# Patient Record
Sex: Female | Born: 1940 | Race: White | Hispanic: No | Marital: Married | Smoking: Never smoker
Health system: Southern US, Community
[De-identification: ages and names within clinical notes are randomized; demographics above are authoritative.]

## PROBLEM LIST (undated history)

## (undated) DIAGNOSIS — T7840XA Allergy, unspecified, initial encounter: Secondary | ICD-10-CM

## (undated) DIAGNOSIS — H269 Unspecified cataract: Secondary | ICD-10-CM

## (undated) DIAGNOSIS — K589 Irritable bowel syndrome without diarrhea: Secondary | ICD-10-CM

## (undated) DIAGNOSIS — M1990 Unspecified osteoarthritis, unspecified site: Secondary | ICD-10-CM

## (undated) DIAGNOSIS — Z8719 Personal history of other diseases of the digestive system: Secondary | ICD-10-CM

## (undated) DIAGNOSIS — Z9889 Other specified postprocedural states: Secondary | ICD-10-CM

## (undated) DIAGNOSIS — K219 Gastro-esophageal reflux disease without esophagitis: Secondary | ICD-10-CM

## (undated) DIAGNOSIS — T8859XA Other complications of anesthesia, initial encounter: Secondary | ICD-10-CM

## (undated) DIAGNOSIS — R112 Nausea with vomiting, unspecified: Secondary | ICD-10-CM

## (undated) DIAGNOSIS — M069 Rheumatoid arthritis, unspecified: Secondary | ICD-10-CM

## (undated) HISTORY — DX: Unspecified cataract: H26.9

## (undated) HISTORY — PX: JOINT REPLACEMENT: SHX530

## (undated) HISTORY — PX: FRACTURE SURGERY: SHX138

## (undated) HISTORY — PX: EYE SURGERY: SHX253

## (undated) HISTORY — PX: ABDOMINAL HYSTERECTOMY: SHX81

## (undated) HISTORY — PX: HAND SURGERY: SHX662

## (undated) HISTORY — DX: Rheumatoid arthritis, unspecified: M06.9

## (undated) HISTORY — DX: Allergy, unspecified, initial encounter: T78.40XA

## (undated) HISTORY — PX: FOOT SURGERY: SHX648

## (undated) HISTORY — PX: COSMETIC SURGERY: SHX468

## (undated) HISTORY — PX: ORIF SHOULDER FRACTURE: SHX5035

## (undated) HISTORY — PX: TUBAL LIGATION: SHX77

## (undated) HISTORY — PX: TONSILLECTOMY: SUR1361

---

## 2010-01-10 ENCOUNTER — Ambulatory Visit: Payer: Self-pay | Admitting: Cardiology

## 2011-02-13 ENCOUNTER — Other Ambulatory Visit: Payer: Self-pay | Admitting: Orthopedic Surgery

## 2011-02-13 ENCOUNTER — Ambulatory Visit
Admission: RE | Admit: 2011-02-13 | Discharge: 2011-02-13 | Disposition: A | Discharge status: Home / Self Care | Payer: Medicare Other | Source: Ambulatory Visit | Attending: Orthopedic Surgery | Admitting: Orthopedic Surgery

## 2011-02-13 DIAGNOSIS — R52 Pain, unspecified: Secondary | ICD-10-CM

## 2011-02-21 ENCOUNTER — Ambulatory Visit (HOSPITAL_COMMUNITY)
Admission: RE | Admit: 2011-02-21 | Discharge: 2011-02-21 | Disposition: A | Discharge status: Home / Self Care | Payer: Medicare Other | Source: Ambulatory Visit | Attending: Orthopedic Surgery | Admitting: Orthopedic Surgery

## 2011-02-21 ENCOUNTER — Encounter (HOSPITAL_COMMUNITY)
Admission: RE | Admit: 2011-02-21 | Discharge: 2011-02-21 | Disposition: A | Discharge status: Home / Self Care | Payer: Medicare Other | Source: Ambulatory Visit | Attending: Orthopedic Surgery | Admitting: Orthopedic Surgery

## 2011-02-21 ENCOUNTER — Other Ambulatory Visit (HOSPITAL_COMMUNITY): Payer: Self-pay | Admitting: Orthopedic Surgery

## 2011-02-21 DIAGNOSIS — S42309A Unspecified fracture of shaft of humerus, unspecified arm, initial encounter for closed fracture: Secondary | ICD-10-CM

## 2011-02-21 DIAGNOSIS — X58XXXA Exposure to other specified factors, initial encounter: Secondary | ICD-10-CM | POA: Insufficient documentation

## 2011-02-21 DIAGNOSIS — S42213A Unspecified displaced fracture of surgical neck of unspecified humerus, initial encounter for closed fracture: Secondary | ICD-10-CM | POA: Insufficient documentation

## 2011-02-21 DIAGNOSIS — Z01812 Encounter for preprocedural laboratory examination: Secondary | ICD-10-CM | POA: Insufficient documentation

## 2011-02-21 DIAGNOSIS — Z01818 Encounter for other preprocedural examination: Secondary | ICD-10-CM | POA: Insufficient documentation

## 2011-02-21 LAB — COMPREHENSIVE METABOLIC PANEL
AST: 31 U/L (ref 0–37)
Albumin: 4.2 g/dL (ref 3.5–5.2)
Alkaline Phosphatase: 103 U/L (ref 39–117)
BUN: 16 mg/dL (ref 6–23)
Chloride: 103 mEq/L (ref 96–112)
Potassium: 4.5 mEq/L (ref 3.5–5.1)
Total Bilirubin: 0.4 mg/dL (ref 0.3–1.2)

## 2011-02-21 LAB — CBC
Hemoglobin: 11.2 g/dL — ABNORMAL LOW (ref 12.0–15.0)
MCH: 28.9 pg (ref 26.0–34.0)
MCHC: 30.4 g/dL (ref 30.0–36.0)
RDW: 14.7 % (ref 11.5–15.5)

## 2011-02-21 LAB — URINALYSIS, ROUTINE W REFLEX MICROSCOPIC
Glucose, UA: NEGATIVE mg/dL
Hgb urine dipstick: NEGATIVE
Ketones, ur: NEGATIVE mg/dL
Protein, ur: NEGATIVE mg/dL
Urobilinogen, UA: 0.2 mg/dL (ref 0.0–1.0)

## 2011-02-21 LAB — DIFFERENTIAL
Basophils Relative: 0 % (ref 0–1)
Eosinophils Absolute: 0.1 10*3/uL (ref 0.0–0.7)
Eosinophils Relative: 2 % (ref 0–5)
Monocytes Absolute: 0.4 10*3/uL (ref 0.1–1.0)
Monocytes Relative: 6 % (ref 3–12)
Neutro Abs: 4.3 10*3/uL (ref 1.7–7.7)

## 2011-02-21 LAB — PROTIME-INR: Prothrombin Time: 12.6 seconds (ref 11.6–15.2)

## 2011-02-21 LAB — TYPE AND SCREEN: Antibody Screen: NEGATIVE

## 2011-02-22 LAB — URINE CULTURE: Culture  Setup Time: 201210231014

## 2011-02-23 ENCOUNTER — Ambulatory Visit (HOSPITAL_COMMUNITY)
Admission: RE | Admit: 2011-02-23 | Discharge: 2011-02-24 | Disposition: A | Discharge status: Home / Self Care | Payer: Medicare Other | Source: Ambulatory Visit | Attending: Orthopedic Surgery | Admitting: Orthopedic Surgery

## 2011-02-23 ENCOUNTER — Inpatient Hospital Stay (HOSPITAL_COMMUNITY): Payer: Medicare Other

## 2011-02-23 DIAGNOSIS — Z01812 Encounter for preprocedural laboratory examination: Secondary | ICD-10-CM | POA: Insufficient documentation

## 2011-02-23 DIAGNOSIS — S42293A Other displaced fracture of upper end of unspecified humerus, initial encounter for closed fracture: Principal | ICD-10-CM | POA: Insufficient documentation

## 2011-02-23 DIAGNOSIS — Z01818 Encounter for other preprocedural examination: Secondary | ICD-10-CM | POA: Insufficient documentation

## 2011-02-23 DIAGNOSIS — Y929 Unspecified place or not applicable: Secondary | ICD-10-CM | POA: Insufficient documentation

## 2011-02-23 DIAGNOSIS — X58XXXA Exposure to other specified factors, initial encounter: Secondary | ICD-10-CM | POA: Insufficient documentation

## 2011-02-24 LAB — CALCIUM, IONIZED: Calcium, Ion: 1.15 mmol/L (ref 1.12–1.32)

## 2011-02-24 LAB — PHOSPHORUS: Phosphorus: 2.6 mg/dL (ref 2.3–4.6)

## 2011-02-24 LAB — BASIC METABOLIC PANEL
GFR calc Af Amer: >90 mL/min (ref >90)
GFR calc non Af Amer: 87 mL/min — ABNORMAL LOW (ref >90)
Potassium: 4.1 mEq/L (ref 3.5–5.1)
Sodium: 143 mEq/L (ref 135–145)

## 2011-02-24 LAB — CBC
MCHC: 31.4 g/dL (ref 30.0–36.0)
RDW: 14.8 % (ref 11.5–15.5)

## 2011-02-28 LAB — PTH, INTACT AND CALCIUM: PTH: 32.2 pg/mL (ref 14.0–72.0)

## 2011-03-01 LAB — VITAMIN D 1,25 DIHYDROXY
Vitamin D2 1, 25 (OH)2: <8 pg/mL
Vitamin D3 1, 25 (OH)2: 39 pg/mL

## 2011-03-07 NOTE — Op Note (Signed)
[name], [name] NO.:  [id number]  MEDICAL RECORD NO.:  [id number]  LOCATION:  5037                         FACILITY:  [facility]  PHYSICIAN:  Doralee Albino. Carola Frost, M.D. DATE OF BIRTH:  [birth year]  DATE OF PROCEDURE:  02/23/2011 DATE OF DISCHARGE:                              OPERATIVE REPORT   PREOPERATIVE DIAGNOSIS:  Left proximal humerus fracture, three-part.  POSTOPERATIVE DIAGNOSIS:  Left proximal humerus fracture, three-part.  PROCEDURE:  Open reduction and internal fixation of left proximal humerus using a Synthes proximal humeral locking plate.  SURGEON:  Doralee Albino. Carola Frost, MD  ASSISTANT:  Mearl Latin, PA  ANESTHESIA:  General.  COMPLICATIONS:  None.  ESTIMATED BLOOD LOSS:  100 mL.  DISPOSITION:  To PACU.  CONDITION:  Stable.  BRIEF SUMMARY AND INDICATION OF PROCEDURE:  Bailey Petersen is a 70 year old left-hand dominant female who sustained a comminuted varus proximal humerus fracture with displacement of the tuberosities and shaft.  I discussed with her the risks and benefits of nonsurgical versus surgical management.  She understood those complications including AVN, arthritis, symptomatic hardware, need for further surgery, DVT, PE, heart attack, stroke, and multiple others and she did wish to proceed.  BRIEF DESCRIPTION OF PROCEDURE:  Ms. Kassing was taken up room after administration of preoperative antibiotics.  General anesthesia was induced.  Her left upper extremity was prepped and draped in sterile fashion after being placed supine on a radiolucent table.  A standard deltopectoral approach was then made through an 8-cm incision.  The deltopectoral groove was identified.  The cephalic vein retracted laterally.  The deltopectoral fascia exposed and incised.  The coracoid process identified and the retractor placed to expose the bursa.  This was removed with dissecting scissors and a #2 FiberWire was placed into the lesser tuberosity  fragment which remained just medial to the biceps groove.  This was largely impacted.  The greater tuberosity fragment was then secured with two #2 FiberWire sutures using Mason-Allen technique to enable mobilization.  The tuberosity fragment to the greater was distracted to allow for disimpaction of the head and reapproximation of the appropriate neck shaft angle.  Later this had to be revised with placement of multiple K-wires tangential to the surface of the articular surface and with the help of my assistant, Montez Morita as well as another pulling on the tuberosity fragments, we were able to reduce and restore this ankle.  Approximately 28 mL of cancellous chips were placed into the area behind the head to hold it in this position and then the tuberosities were closed.  They were sutured to one another and then the plate affixed in the appropriate position and height with C-arm to confirm and check this.  This was then followed by placement of a standard screw proximally and maximally apposed the plate to the bone and then multiple locked screws, standard screw distally and then a lock screw as well.  Final images showed appropriate restoration of neck shaft angle, reduction of the tuberosities which were under the plate, and no other complications.  Wound was irrigated thoroughly and closed in standard layered fashion with 0, 2-0 Vicryl, and 3-0 nylon for the  skin.  Sterile gently compressive dressing was applied.  The patient was awakened from anesthesia and transported to the PACU in stable condition.  Again, Montez Morita, PA-C assisted me throughout procedure, essentially necessary for safe and effective completion of the case.  PROGNOSIS:  Ms. Swint will have gentle passive motion.  She had outstanding motion following repair on the table and will be transitioned to active assisted and active range of motion at 6-8 weeks.     Doralee Albino. Carola Frost, M.D.     [facility]/MEDQ  D:   02/23/2011  T:  02/23/2011  Job:  [id number]  Electronically Signed by Myrene Galas M.D. on 03/07/2011 01:47:27 PM

## 2011-12-04 DIAGNOSIS — M810 Age-related osteoporosis without current pathological fracture: Secondary | ICD-10-CM | POA: Insufficient documentation

## 2012-09-13 IMAGING — CR DG CHEST 2V
2 series · 2 of 2 positions shown · non-contrast
Comparison: None

CLINICAL DATA: Preop humeral fracture   surgery

CHEST - 2 VIEW

[view not recorded (1 of 2)]
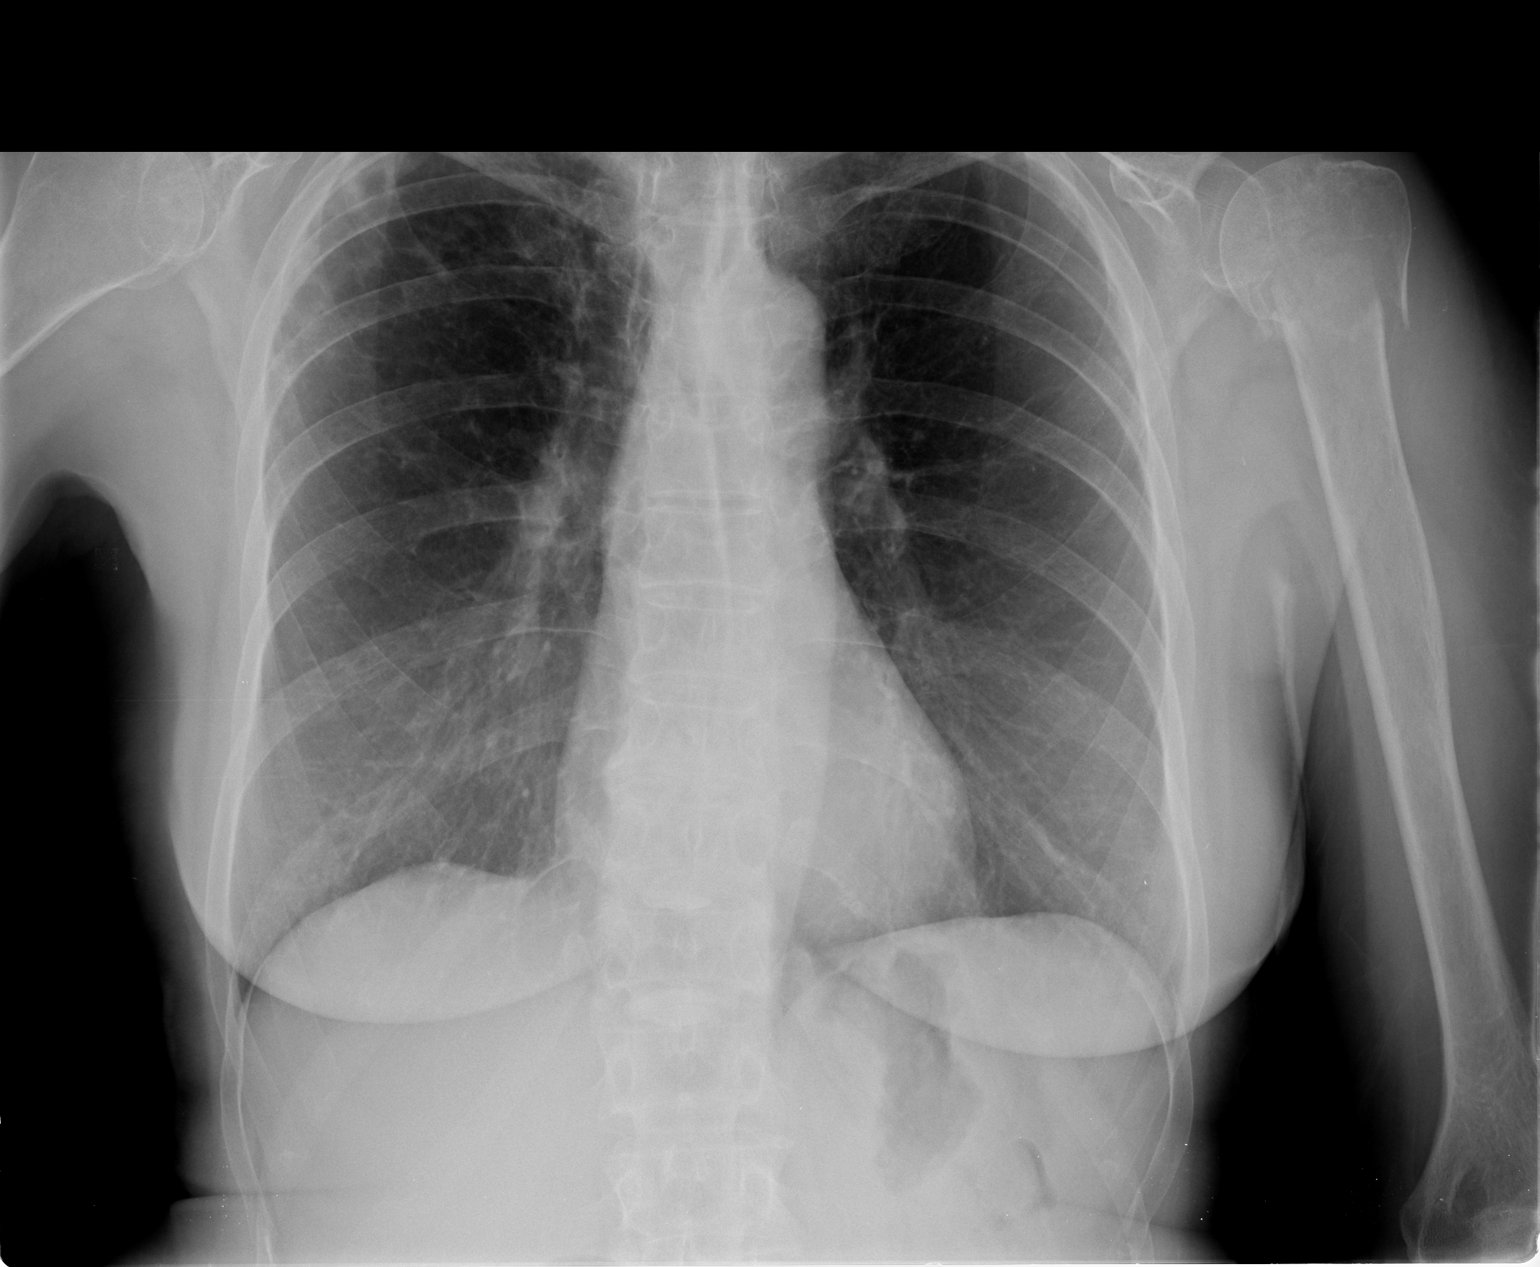

[view not recorded (2 of 2)]
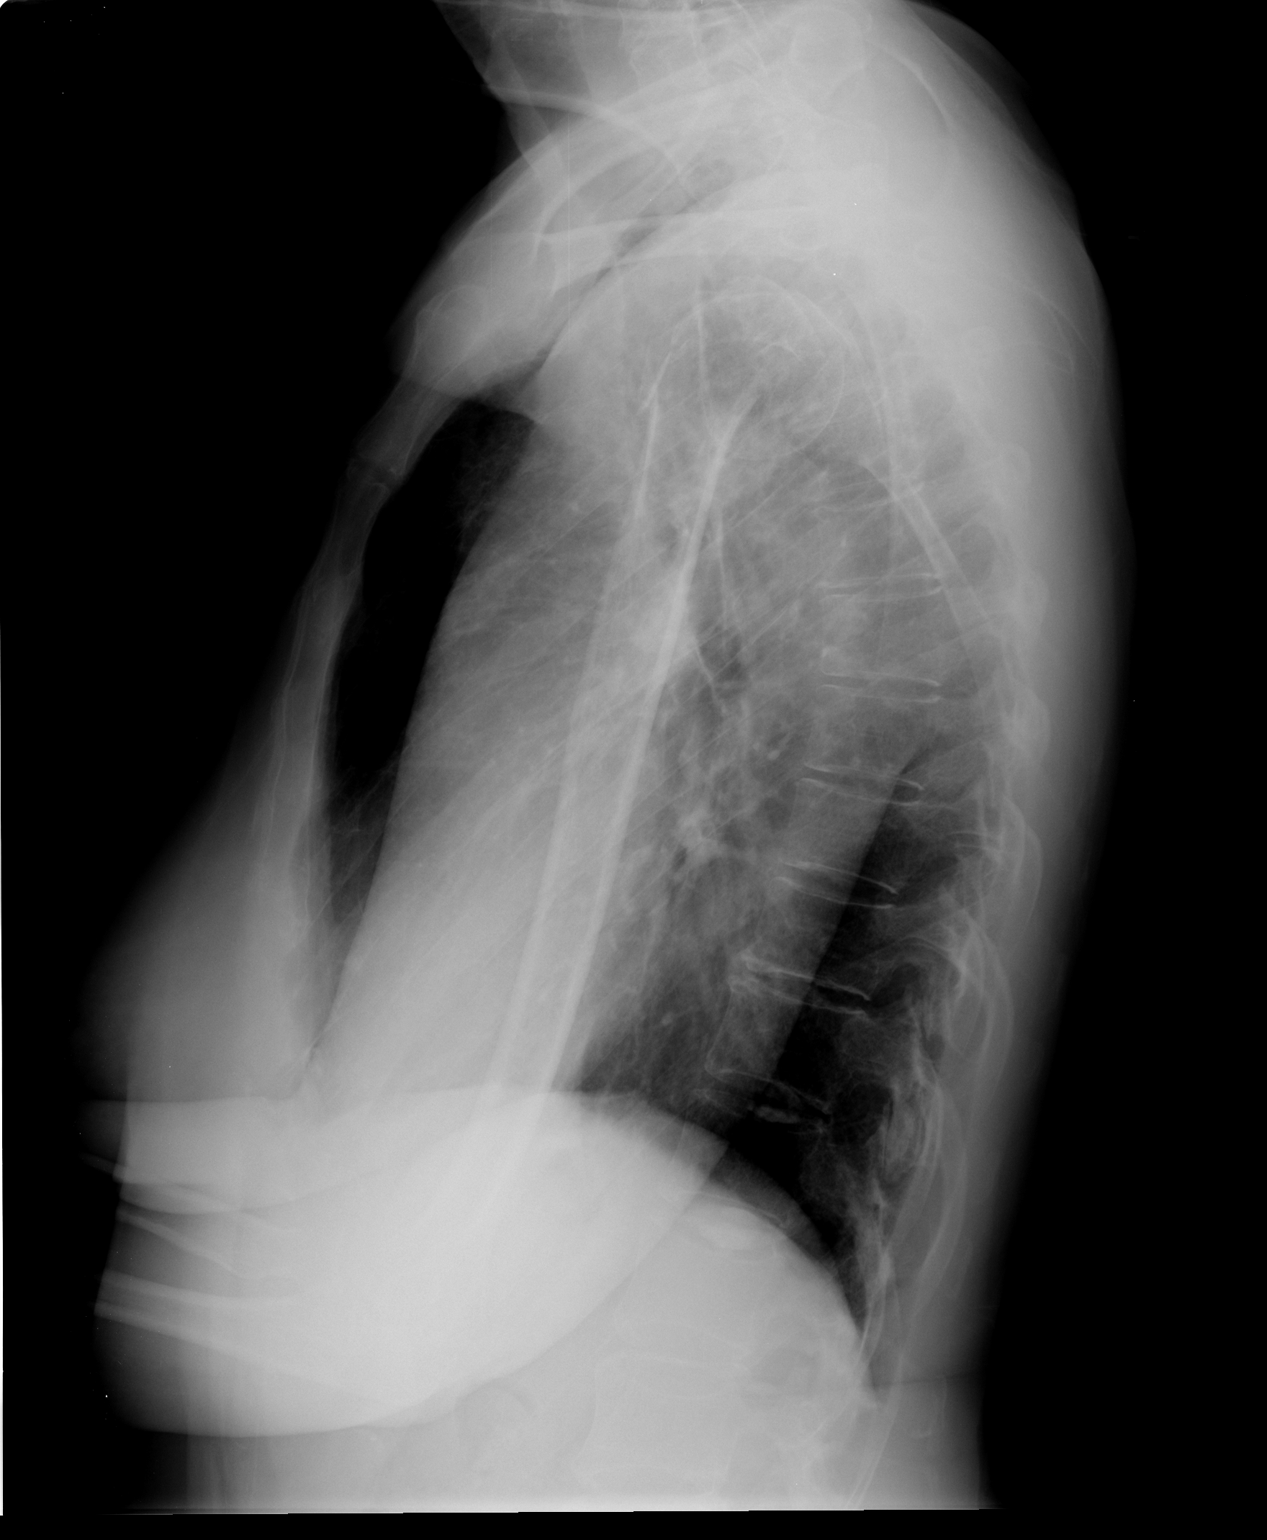

[2 of 2 positions shown; findings below may reference images not displayed]

FINDINGS: Heart size is normal.  Negative for heart failure.

Pleural and parenchymal apical densities bilaterally are most
likely due to scarring.  No prior studies are available for
comparison.  Based on the shoulder CT which includes the  left
upper lobe, this appears to be due to scarring.

Negative for pneumonia or effusion.

Displaced fracture of the left humeral neck.
IMPRESSION: Left humeral neck fracture.

Apical pleural and parenchymal scarring bilaterally.  Comparison
with prior studies is suggested if available,  to assure stability.

## 2012-09-15 IMAGING — RF DG HUMERUS 2V *L*
1 series · 6 of 6 positions shown · non-contrast
Comparison: CT 02/13/2011.

CLINICAL DATA: Left humeral fracture ORIF.

LEFT HUMERUS - 2+ VIEW

[Series 1: run · 6 of 6 slices shown]
[im 1/6]
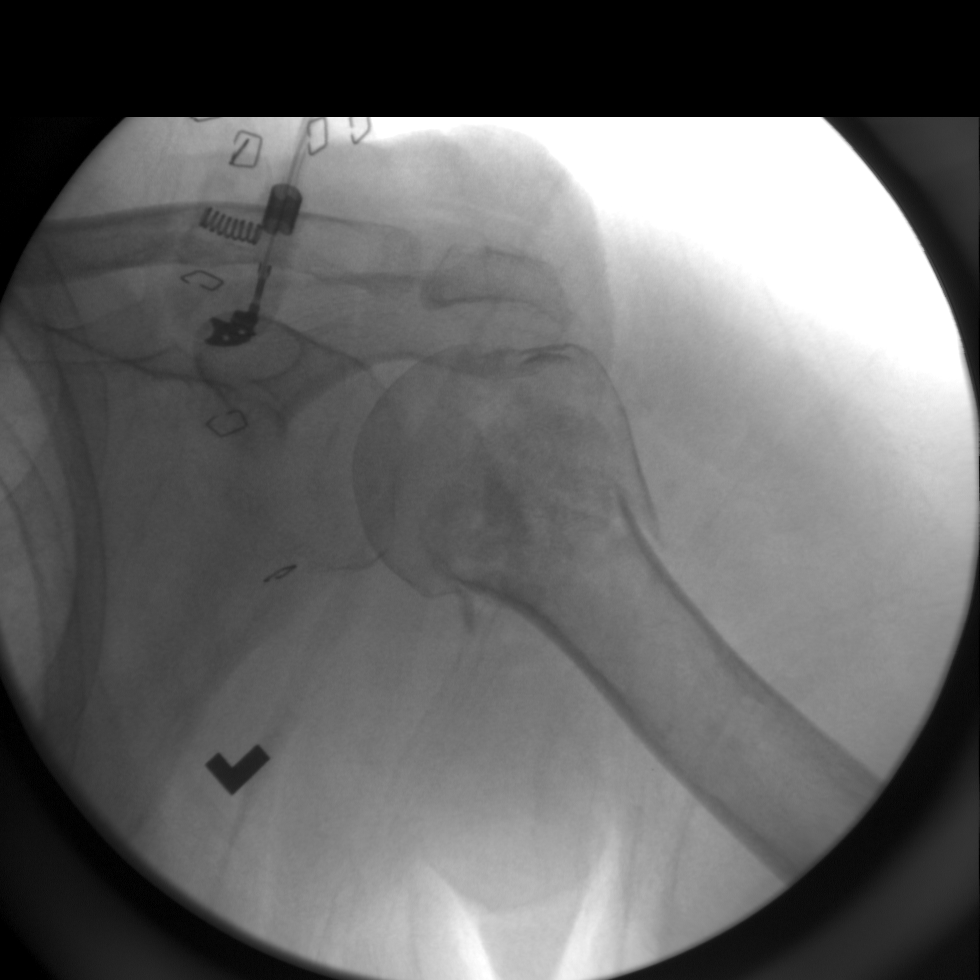
[im 2/6]
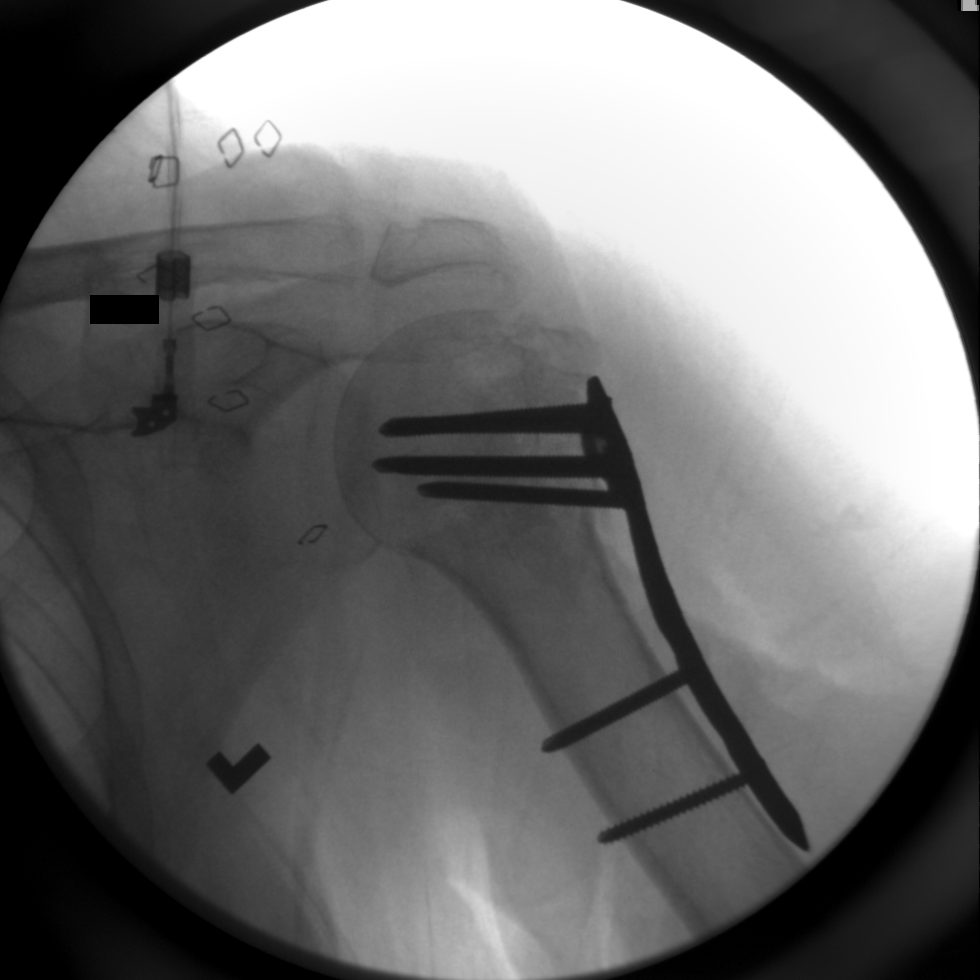
[im 3/6]
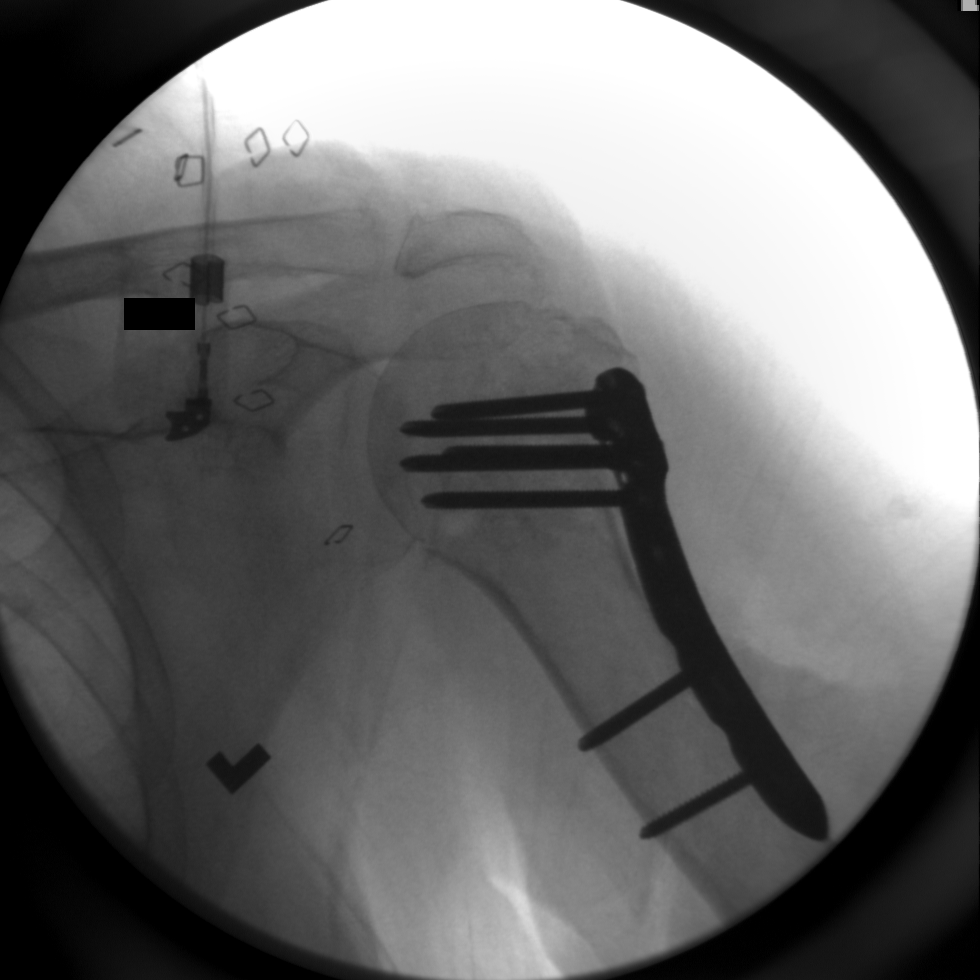
[im 4/6]
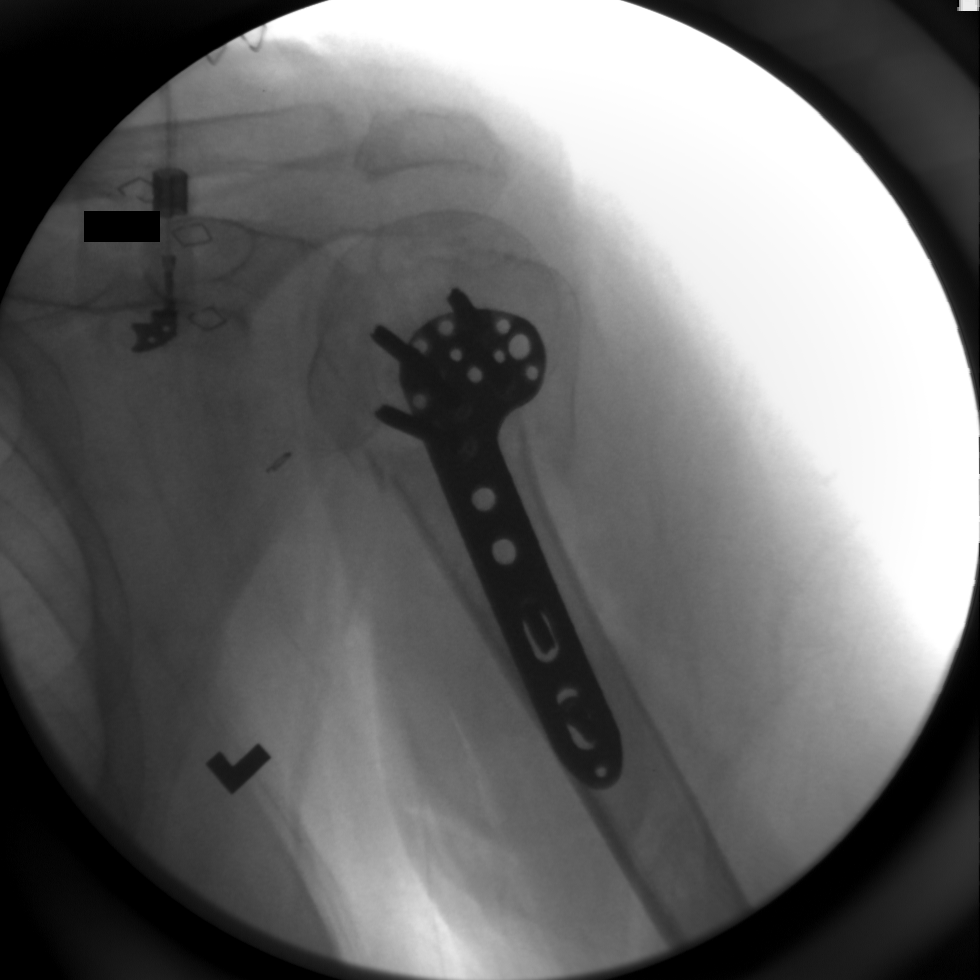
[im 5/6]
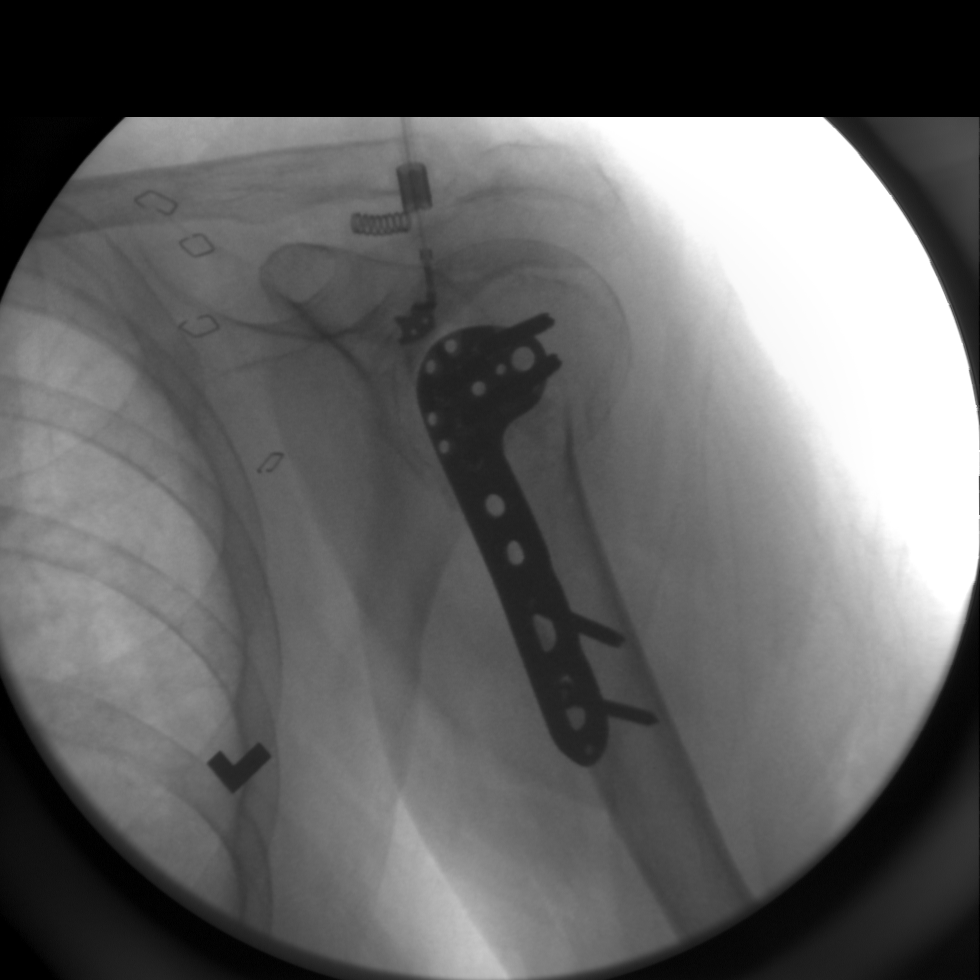
[im 6/6]
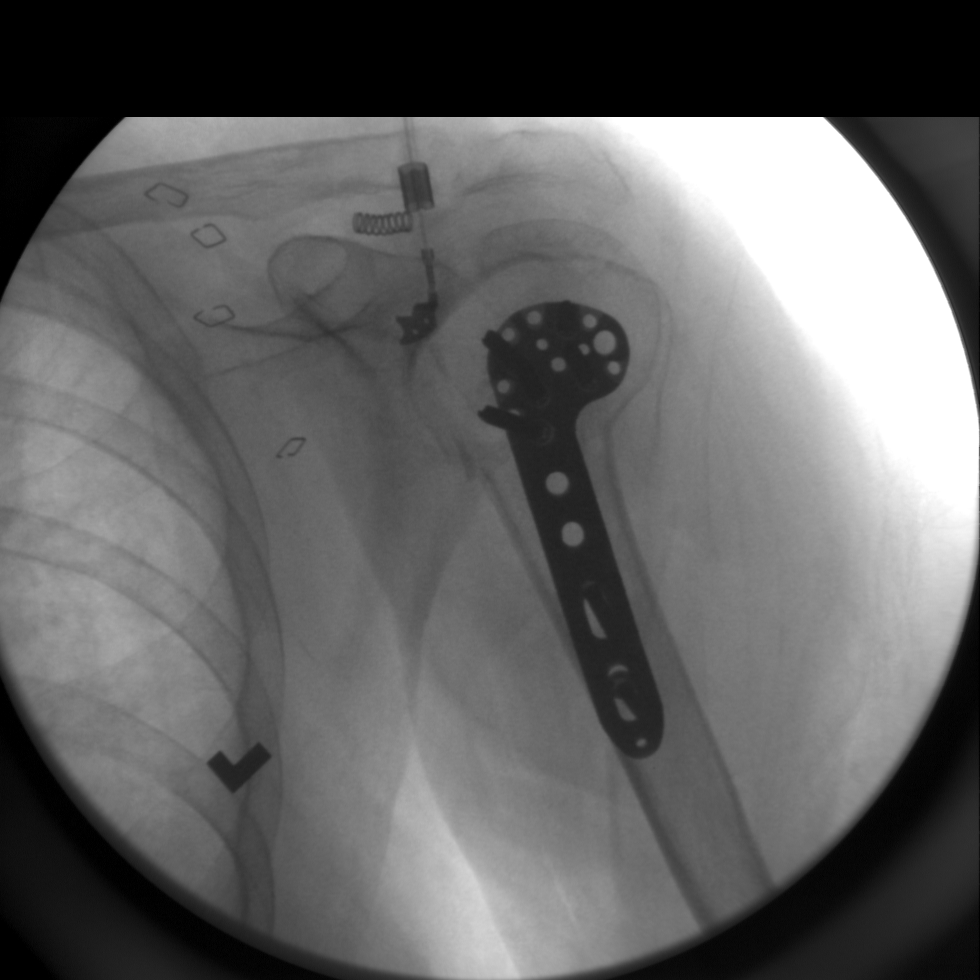

[6 of 6 positions shown; findings below may reference images not displayed]

FINDINGS: Six spot fluoroscopic images demonstrate placement of a
lateral plate and screws with near anatomic reduction of the
comminuted proximal left humeral fracture.  Hardware appears well
positioned.  No complications are identified.
IMPRESSION: Improved alignment of comminuted proximal left humeral fracture
status post ORIF.

## 2013-10-15 ENCOUNTER — Ambulatory Visit: Payer: No Typology Code available for payment source | Admitting: Neurology

## 2016-12-07 DIAGNOSIS — D649 Anemia, unspecified: Secondary | ICD-10-CM | POA: Insufficient documentation

## 2017-03-15 ENCOUNTER — Other Ambulatory Visit: Payer: Self-pay | Admitting: Orthopedic Surgery

## 2017-03-15 DIAGNOSIS — M5410 Radiculopathy, site unspecified: Secondary | ICD-10-CM

## 2017-03-30 DIAGNOSIS — K219 Gastro-esophageal reflux disease without esophagitis: Secondary | ICD-10-CM | POA: Insufficient documentation

## 2017-03-30 DIAGNOSIS — K222 Esophageal obstruction: Secondary | ICD-10-CM | POA: Insufficient documentation

## 2017-04-02 ENCOUNTER — Ambulatory Visit
Admission: RE | Admit: 2017-04-02 | Discharge: 2017-04-02 | Disposition: A | Discharge status: Home / Self Care | Payer: Medicare Other | Source: Ambulatory Visit | Attending: Orthopedic Surgery | Admitting: Orthopedic Surgery

## 2017-04-02 DIAGNOSIS — M5410 Radiculopathy, site unspecified: Secondary | ICD-10-CM

## 2017-05-03 ENCOUNTER — Other Ambulatory Visit: Payer: Self-pay | Admitting: Orthopedic Surgery

## 2017-05-03 DIAGNOSIS — M5416 Radiculopathy, lumbar region: Secondary | ICD-10-CM

## 2017-05-15 ENCOUNTER — Ambulatory Visit
Admission: RE | Admit: 2017-05-15 | Discharge: 2017-05-15 | Disposition: A | Discharge status: Home / Self Care | Payer: Medicare Other | Source: Ambulatory Visit | Attending: Orthopedic Surgery | Admitting: Orthopedic Surgery

## 2017-05-15 ENCOUNTER — Other Ambulatory Visit: Payer: Self-pay | Admitting: Orthopedic Surgery

## 2017-05-15 DIAGNOSIS — M5416 Radiculopathy, lumbar region: Secondary | ICD-10-CM

## 2017-05-15 MED ORDER — METHYLPREDNISOLONE ACETATE 40 MG/ML INJ SUSP (RADIOLOG
120.0000 mg | Freq: Once | INTRAMUSCULAR | Status: AC
  Administered 2017-05-15: 120 mg via EPIDURAL

## 2017-05-15 MED ORDER — IOPAMIDOL (ISOVUE-M 200) INJECTION 41%
1.0000 mL | Freq: Once | INTRAMUSCULAR | Status: AC
  Administered 2017-05-15: 1 mL via EPIDURAL

## 2017-05-15 NOTE — Discharge Instructions (Signed)

## 2017-10-16 DIAGNOSIS — N952 Postmenopausal atrophic vaginitis: Secondary | ICD-10-CM | POA: Insufficient documentation

## 2017-10-16 DIAGNOSIS — M069 Rheumatoid arthritis, unspecified: Secondary | ICD-10-CM | POA: Insufficient documentation

## 2017-10-16 DIAGNOSIS — Z13220 Encounter for screening for lipoid disorders: Secondary | ICD-10-CM | POA: Insufficient documentation

## 2017-10-16 DIAGNOSIS — R197 Diarrhea, unspecified: Secondary | ICD-10-CM | POA: Insufficient documentation

## 2019-10-18 DIAGNOSIS — L0291 Cutaneous abscess, unspecified: Secondary | ICD-10-CM | POA: Insufficient documentation

## 2020-02-12 DIAGNOSIS — M79641 Pain in right hand: Secondary | ICD-10-CM | POA: Insufficient documentation

## 2020-03-15 ENCOUNTER — Other Ambulatory Visit: Payer: Self-pay

## 2020-03-15 ENCOUNTER — Encounter (HOSPITAL_BASED_OUTPATIENT_CLINIC_OR_DEPARTMENT_OTHER): Payer: Self-pay | Admitting: Orthopedic Surgery

## 2020-03-17 NOTE — Progress Notes (Signed)

## 2020-03-19 ENCOUNTER — Other Ambulatory Visit (HOSPITAL_COMMUNITY)
Admission: RE | Admit: 2020-03-19 | Discharge: 2020-03-19 | Disposition: A | Discharge status: Home / Self Care | Payer: Medicare Other | Source: Ambulatory Visit | Attending: Orthopedic Surgery | Admitting: Orthopedic Surgery

## 2020-03-19 DIAGNOSIS — Z20822 Contact with and (suspected) exposure to covid-19: Secondary | ICD-10-CM | POA: Insufficient documentation

## 2020-03-19 DIAGNOSIS — Z01812 Encounter for preprocedural laboratory examination: Secondary | ICD-10-CM | POA: Diagnosis present

## 2020-03-19 LAB — SARS CORONAVIRUS 2 (TAT 6-24 HRS): SARS Coronavirus 2: NEGATIVE

## 2020-03-23 ENCOUNTER — Ambulatory Visit (HOSPITAL_BASED_OUTPATIENT_CLINIC_OR_DEPARTMENT_OTHER): Payer: Medicare Other | Admitting: Anesthesiology

## 2020-03-23 ENCOUNTER — Ambulatory Visit (HOSPITAL_BASED_OUTPATIENT_CLINIC_OR_DEPARTMENT_OTHER)
Admission: RE | Admit: 2020-03-23 | Discharge: 2020-03-23 | Disposition: A | Discharge status: Home / Self Care | Payer: Medicare Other | Attending: Orthopedic Surgery | Admitting: Orthopedic Surgery

## 2020-03-23 ENCOUNTER — Encounter (HOSPITAL_BASED_OUTPATIENT_CLINIC_OR_DEPARTMENT_OTHER)
Admission: RE | Disposition: A | Discharge status: Home / Self Care | Payer: Self-pay | Source: Home / Self Care | Attending: Orthopedic Surgery

## 2020-03-23 ENCOUNTER — Other Ambulatory Visit: Payer: Self-pay

## 2020-03-23 ENCOUNTER — Encounter (HOSPITAL_BASED_OUTPATIENT_CLINIC_OR_DEPARTMENT_OTHER): Payer: Self-pay | Admitting: Orthopedic Surgery

## 2020-03-23 DIAGNOSIS — Z79899 Other long term (current) drug therapy: Secondary | ICD-10-CM | POA: Diagnosis not present

## 2020-03-23 DIAGNOSIS — M19041 Primary osteoarthritis, right hand: Secondary | ICD-10-CM | POA: Diagnosis present

## 2020-03-23 DIAGNOSIS — Z791 Long term (current) use of non-steroidal anti-inflammatories (NSAID): Secondary | ICD-10-CM | POA: Insufficient documentation

## 2020-03-23 DIAGNOSIS — Z79890 Hormone replacement therapy (postmenopausal): Secondary | ICD-10-CM | POA: Diagnosis not present

## 2020-03-23 HISTORY — DX: Other specified postprocedural states: Z98.890

## 2020-03-23 HISTORY — DX: Other complications of anesthesia, initial encounter: T88.59XA

## 2020-03-23 HISTORY — DX: Other specified postprocedural states: R11.2

## 2020-03-23 HISTORY — DX: Personal history of other diseases of the digestive system: Z87.19

## 2020-03-23 HISTORY — DX: Unspecified osteoarthritis, unspecified site: M19.90

## 2020-03-23 HISTORY — PX: FINGER ARTHROPLASTY: SHX5017

## 2020-03-23 HISTORY — DX: Irritable bowel syndrome, unspecified: K58.9

## 2020-03-23 HISTORY — DX: Gastro-esophageal reflux disease without esophagitis: K21.9

## 2020-03-23 SURGERY — ARTHROPLASTY, FINGER
Anesthesia: Monitor Anesthesia Care | Site: Finger | Laterality: Right

## 2020-03-23 MED ORDER — BUPIVACAINE HCL (PF) 0.5 % IJ SOLN
INTRAMUSCULAR | Status: AC
  Filled 2020-03-23: qty 30

## 2020-03-23 MED ORDER — BUPIVACAINE HCL (PF) 0.25 % IJ SOLN
INTRAMUSCULAR | Status: AC
  Filled 2020-03-23: qty 30

## 2020-03-23 MED ORDER — ONDANSETRON HCL 4 MG/2ML IJ SOLN
INTRAMUSCULAR | Status: AC
  Filled 2020-03-23: qty 2

## 2020-03-23 MED ORDER — MIDAZOLAM HCL 2 MG/2ML IJ SOLN
INTRAMUSCULAR | Status: AC
  Filled 2020-03-23: qty 2

## 2020-03-23 MED ORDER — FENTANYL CITRATE (PF) 100 MCG/2ML IJ SOLN
INTRAMUSCULAR | Status: AC
  Filled 2020-03-23: qty 2

## 2020-03-23 MED ORDER — SUCCINYLCHOLINE CHLORIDE 200 MG/10ML IV SOSY
PREFILLED_SYRINGE | INTRAVENOUS | Status: AC
  Filled 2020-03-23: qty 10

## 2020-03-23 MED ORDER — VANCOMYCIN HCL IN DEXTROSE 1-5 GM/200ML-% IV SOLN
INTRAVENOUS | Status: AC
  Filled 2020-03-23: qty 200

## 2020-03-23 MED ORDER — 0.9 % SODIUM CHLORIDE (POUR BTL) OPTIME
TOPICAL | Status: DC | PRN
  Administered 2020-03-23: 400 mL

## 2020-03-23 MED ORDER — PROPOFOL 500 MG/50ML IV EMUL
INTRAVENOUS | Status: DC | PRN
  Administered 2020-03-23: 100 ug/kg/min via INTRAVENOUS

## 2020-03-23 MED ORDER — PROMETHAZINE HCL 25 MG/ML IJ SOLN: 6.2500 mg | INTRAMUSCULAR | Status: DC | PRN

## 2020-03-23 MED ORDER — PHENYLEPHRINE 40 MCG/ML (10ML) SYRINGE FOR IV PUSH (FOR BLOOD PRESSURE SUPPORT)
PREFILLED_SYRINGE | INTRAVENOUS | Status: AC
  Filled 2020-03-23: qty 10

## 2020-03-23 MED ORDER — EPHEDRINE 5 MG/ML INJ
INTRAVENOUS | Status: AC
  Filled 2020-03-23: qty 10

## 2020-03-23 MED ORDER — VANCOMYCIN HCL IN DEXTROSE 1-5 GM/200ML-% IV SOLN
1000.0000 mg | INTRAVENOUS | Status: AC
  Administered 2020-03-23: 1000 mg via INTRAVENOUS

## 2020-03-23 MED ORDER — FENTANYL CITRATE (PF) 100 MCG/2ML IJ SOLN
50.0000 ug | Freq: Once | INTRAMUSCULAR | Status: AC
  Administered 2020-03-23: 50 ug via INTRAVENOUS

## 2020-03-23 MED ORDER — HYDROMORPHONE HCL 1 MG/ML IJ SOLN: 0.2500 mg | INTRAMUSCULAR | Status: DC | PRN

## 2020-03-23 MED ORDER — MIDAZOLAM HCL 2 MG/2ML IJ SOLN
1.0000 mg | Freq: Once | INTRAMUSCULAR | Status: AC
  Administered 2020-03-23: 1 mg via INTRAVENOUS

## 2020-03-23 MED ORDER — PROPOFOL 500 MG/50ML IV EMUL
INTRAVENOUS | Status: AC
  Filled 2020-03-23: qty 100

## 2020-03-23 MED ORDER — LIDOCAINE 2% (20 MG/ML) 5 ML SYRINGE
INTRAMUSCULAR | Status: AC
  Filled 2020-03-23: qty 5

## 2020-03-23 MED ORDER — OXYCODONE HCL 5 MG/5ML PO SOLN: 5.0000 mg | Freq: Once | ORAL | Status: DC | PRN

## 2020-03-23 MED ORDER — OXYCODONE HCL 5 MG PO TABS: 5.0000 mg | ORAL_TABLET | Freq: Once | ORAL | Status: DC | PRN

## 2020-03-23 MED ORDER — LACTATED RINGERS IV SOLN: INTRAVENOUS | Status: DC

## 2020-03-23 MED ORDER — MIDAZOLAM HCL 5 MG/5ML IJ SOLN
INTRAMUSCULAR | Status: DC | PRN
  Administered 2020-03-23: .5 mg via INTRAVENOUS

## 2020-03-23 MED ORDER — ROPIVACAINE HCL 5 MG/ML IJ SOLN
INTRAMUSCULAR | Status: DC | PRN
  Administered 2020-03-23: 30 mL via PERINEURAL

## 2020-03-23 MED ORDER — FENTANYL CITRATE (PF) 100 MCG/2ML IJ SOLN
INTRAMUSCULAR | Status: DC | PRN
  Administered 2020-03-23: 25 ug via INTRAVENOUS

## 2020-03-23 SURGICAL SUPPLY — 68 items
BLADE CLIPPER SURG (BLADE) IMPLANT
BLADE OSC/SAG .038X5.5 CUT EDG (BLADE) ×3 IMPLANT
BLADE SURG 15 STRL LF DISP TIS (BLADE) ×3 IMPLANT
BLADE SURG 15 STRL SS (BLADE) ×9
BNDG CONFORM 3 STRL LF (GAUZE/BANDAGES/DRESSINGS) ×3 IMPLANT
BNDG ELASTIC 3X5.8 VLCR STR LF (GAUZE/BANDAGES/DRESSINGS) ×6 IMPLANT
BNDG GAUZE ELAST 4 BULKY (GAUZE/BANDAGES/DRESSINGS) ×3 IMPLANT
BRUSH SCRUB EZ PLAIN DRY (MISCELLANEOUS) ×3 IMPLANT
BUR OVAL CARBIDE 4.0 (BURR) ×3 IMPLANT
CANISTER SUCT 1200ML W/VALVE (MISCELLANEOUS) ×3 IMPLANT
CORD BIPOLAR FORCEPS 12FT (ELECTRODE) ×3 IMPLANT
COVER BACK TABLE 60X90IN (DRAPES) ×3 IMPLANT
COVER WAND RF STERILE (DRAPES) IMPLANT
CUFF TOURN SGL QUICK 18X4 (TOURNIQUET CUFF) ×3 IMPLANT
DECANTER SPIKE VIAL GLASS SM (MISCELLANEOUS) IMPLANT
DRAPE EXTREMITY T 121X128X90 (DISPOSABLE) ×3 IMPLANT
DRAPE OEC MINIVIEW 54X84 (DRAPES) ×3 IMPLANT
DRAPE SURG 17X23 STRL (DRAPES) ×3 IMPLANT
DRSG EMULSION OIL 3X3 NADH (GAUZE/BANDAGES/DRESSINGS) ×3 IMPLANT
GAUZE 4X4 16PLY RFD (DISPOSABLE) IMPLANT
GAUZE XEROFORM 5X9 LF (GAUZE/BANDAGES/DRESSINGS) ×3 IMPLANT
GLOVE BIOGEL M STRL SZ7.5 (GLOVE) IMPLANT
GLOVE ECLIPSE 6.5 STRL STRAW (GLOVE) ×3 IMPLANT
GLOVE SS BIOGEL STRL SZ 8 (GLOVE) ×1 IMPLANT
GLOVE SUPERSENSE BIOGEL SZ 8 (GLOVE) ×2
GLOVE SURG UNDER POLY LF SZ7 (GLOVE) ×6 IMPLANT
GOWN STRL REUS W/ TWL LRG LVL3 (GOWN DISPOSABLE) ×1 IMPLANT
GOWN STRL REUS W/ TWL XL LVL3 (GOWN DISPOSABLE) ×1 IMPLANT
GOWN STRL REUS W/TWL LRG LVL3 (GOWN DISPOSABLE) ×3
GOWN STRL REUS W/TWL XL LVL3 (GOWN DISPOSABLE) ×3
IMPL FINGER MCP SZ40 (Orthopedic Implant) ×1 IMPLANT
IMPL FINGER PIP SZ4 (Orthopedic Implant) ×1 IMPLANT
IMPLANT FINGER MCP SZ40 (Orthopedic Implant) ×3 IMPLANT
IMPLANT FINGER PIP SZ4 (Orthopedic Implant) ×3 IMPLANT
K-WIRE .035X4 (WIRE) ×3 IMPLANT
NEEDLE HYPO 22GX1.5 SAFETY (NEEDLE) IMPLANT
NEEDLE HYPO 25X1 1.5 SAFETY (NEEDLE) IMPLANT
NS IRRIG 1000ML POUR BTL (IV SOLUTION) ×3 IMPLANT
PACK BASIN DAY SURGERY FS (CUSTOM PROCEDURE TRAY) ×3 IMPLANT
PAD CAST 3X4 CTTN HI CHSV (CAST SUPPLIES) ×1 IMPLANT
PADDING CAST ABS 3INX4YD NS (CAST SUPPLIES) ×2
PADDING CAST ABS 4INX4YD NS (CAST SUPPLIES)
PADDING CAST ABS COTTON 3X4 (CAST SUPPLIES) ×1 IMPLANT
PADDING CAST ABS COTTON 4X4 ST (CAST SUPPLIES) IMPLANT
PADDING CAST COTTON 3X4 STRL (CAST SUPPLIES) ×3
SHEET MEDIUM DRAPE 40X70 STRL (DRAPES) ×3 IMPLANT
SLEEVE SCD COMPRESS KNEE MED (MISCELLANEOUS) ×3 IMPLANT
SLING ARM FOAM STRAP MED (SOFTGOODS) ×3 IMPLANT
SPLINT FIBERGLASS 4X30 (CAST SUPPLIES) ×3 IMPLANT
SPLINT PLASTER CAST XFAST 3X15 (CAST SUPPLIES) IMPLANT
SPLINT PLASTER XTRA FASTSET 3X (CAST SUPPLIES)
STOCKINETTE 4X48 STRL (DRAPES) ×3 IMPLANT
STOCKINETTE SYNTHETIC 3 UNSTER (CAST SUPPLIES) ×3 IMPLANT
SUCTION FRAZIER HANDLE 10FR (MISCELLANEOUS) ×2
SUCTION TUBE FRAZIER 10FR DISP (MISCELLANEOUS) ×1 IMPLANT
SUT FIBERWIRE 3-0 18 TAPR NDL (SUTURE)
SUT FIBERWIRE 4-0 18 TAPR NDL (SUTURE) ×15
SUT PROLENE 4 0 PS 2 18 (SUTURE) ×6 IMPLANT
SUTURE FIBERWR 3-0 18 TAPR NDL (SUTURE) IMPLANT
SUTURE FIBERWR 4-0 18 TAPR NDL (SUTURE) ×5 IMPLANT
SYR BULB EAR ULCER 3OZ GRN STR (SYRINGE) ×3 IMPLANT
SYR CONTROL 10ML LL (SYRINGE) IMPLANT
TAPE SURG TRANSPORE 1 IN (GAUZE/BANDAGES/DRESSINGS) ×1 IMPLANT
TAPE SURGICAL TRANSPORE 1 IN (GAUZE/BANDAGES/DRESSINGS) ×2
TOWEL GREEN STERILE FF (TOWEL DISPOSABLE) ×6 IMPLANT
TUBE CONNECTING 20'X1/4 (TUBING) ×1
TUBE CONNECTING 20X1/4 (TUBING) ×2 IMPLANT
UNDERPAD 30X36 HEAVY ABSORB (UNDERPADS AND DIAPERS) ×3 IMPLANT

## 2020-03-23 NOTE — Anesthesia Procedure Notes (Signed)
Anesthesia Regional Block: Supraclavicular block   Pre-Anesthetic Checklist: ,, timeout performed, Correct Patient, Correct Site, Correct Laterality, Correct Procedure, Correct Position, site marked, Risks and benefits discussed,  Surgical consent,  Pre-op evaluation,  At surgeon's request and post-op pain management  Laterality: Right  Prep: chloraprep       Needles:  Injection technique: Single-shot  Needle Type: Stimiplex     Needle Length: 9cm  Needle Gauge: 21     Additional Needles:   Procedures:,,,, ultrasound used (permanent image in chart),,,,  Narrative:  Start time: 03/23/2020 12:29 PM End time: 03/23/2020 12:34 PM Injection made incrementally with aspirations every 5 mL.  Performed by: Personally  Anesthesiologist: Lowella Curb, MD

## 2020-03-23 NOTE — Progress Notes (Signed)
Assisted Dr. Miller with right, ultrasound guided, supraclavicular block. Side rails up, monitors on throughout procedure. See vital signs in flow sheet. Tolerated Procedure well. 

## 2020-03-23 NOTE — Op Note (Signed)
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  Patient presents for surgical arthroplasties.  Postop diagnosis: The same  Operative procedure #1 right index finger MCP/metacarpal phalangeal joint arthroplasty with size 40 silicone implant.  #2 extensor realignment right index finger extensor apparatus #3 stress radiography right index finger #4 size 4 PIP arthroplasty proximal interphalangeal joint right middle finger #5 extensor tendon VY lengthening at the finger/hand region #6 stress radiography right middle finger  Surgeon Dominica Severin  Anesthesia: Per anesthesia record  Estimated blood loss minimal  Drains none  Complications none  Operation detail patient was seen by myself and anesthesia she was taken to the operative theater and underwent a smooth induction of IV sedation.  Preoperative block was in excellent working fashion.  She was prepped with Hibiclens prescrub followed by 10-minute surgical Betadine scrub and paint.  Following this the patient then underwent a very careful and cautious approach to the extremity with timeout being observed and tourniquet insufflated.  A curvilinear incision was made over the right middle finger PIP joint.  Following this I performed a turndown VY lengthening type extensor incision.  This was turned down nicely and the joint was exposed.  There was a massive amount of devitalized bone tissue cystic material and synovitis.  We performed an arthrotomy synovectomy followed by release of the ligamentous architecture and following this placed in all in the proximal phalanx and middle phalanx and then broached to size after step cuts were made.  Given the chronic deformity and loss of motion we had to perform some bone sculpting with orthopedic instruments including  bur and saw so as to accommodate a size 4 PIP implant from [place].  Patient was irrigated trials were placed followed by final no touch technique for seeding the implant.  Irrigation was applied and the VY turndown flap was then repaired in a slightly lengthened fashion this was a extensor VY lengthening.  This was performed with FiberWire.  The wound was ultimately closed after hemostasis was secured with Prolene suture.  The patient tolerated this well and x-rays were performed showing adequate position of the implants.  I was pleased with this.  There were no complications.  Following this attention was turned towards the index finger incision was made dissection was carried down interval between the EIP and EDC was created followed by opening of the capsule.  Following this synovectomy arthrotomy and preparation of the metacarpal and proximal phalanx ensued with all followed by step cuts followed by broaching to a size 40 ascension silicone implant.  This was tested with trial and following this the implant was placed.  I preset the bony drill holes a radial collateral ligament repair suture.  This was performed with a Krakw weave and tied over bone bridge after the implant was seated.  Following this we then performed repair of the extensor apparatus and extensor tendon rebalancing to prevent subluxation.  This was an extensor tendon realignment procedure with FiberWire.  Final copy x-rays were made and looked excellent.  I was pleased with this and the findings.  Once this was complete we then performed careful and cautious irrigation followed by closure of the skin edge with Prolene.  We discussed with the patient in a modified their procedure previously in the office however per mutual agreement both she and I will wait on this as the symptoms are  minimal at present time.  We will plan to proceed accordingly with postoperative dilatated protocol.  She was discharged home on antibiotics in  the form of Bactrim oxycodone Robaxin and Zofran.  They have my cell phone number should any problems occur.  I discussed all issues with the patient at length.  At present juncture I am quite pleased with the reconstructive efforts.  Moving forward we will plan for therapeutic algorithm.  In terms of therapy will see her back in 2 weeks in 2 weeks we will go ahead and remove her sutures.  At that time we will then place her in a volar splint.  We will buddy tape the index to the middle finger at the middle phalanx and proximal phalanx regions and make sure that we have no tension on the radial collateral ligament of the index finger.  I want to make sure there is a spacer between the middle and ring finger so that she does not have pull of the middle finger against the index.  Our goal is to buddy tape and take tension off the radial collateral ligament of the PIP joint of the middle finger and to have this heal.  Our goal is also to allow radial collateral ligament of the index finger to have no undue tension and healing a nice stable state.  Between weeks 2 through 6 we will honor the time for healing with these position she can come out for 50% motion starting at week 4 and 8 weeks 700% motion attempts.  After week 8 we will watch for any extension lag and of course continue nighttime extension splinting to make sure that there are no complicating features.  The initial splint should be MCPs in 0 and PIPs and 0 with DIPs free.  Mickey Hebel MD

## 2020-03-23 NOTE — Anesthesia Preprocedure Evaluation (Signed)
Anesthesia Evaluation  Patient identified by MRN, date of birth, ID band Patient awake    Reviewed: Allergy & Precautions, NPO status , [name] Chart, lab work & pertinent test results  History of Anesthesia Complications (+) PONV  Airway Mallampati: II  TM Distance: >3 FB Neck ROM: Full    Dental no notable dental hx.    Pulmonary neg pulmonary ROS,    Pulmonary exam normal breath sounds clear to auscultation       Cardiovascular negative cardio ROS Normal cardiovascular exam Rhythm:Regular Rate:Normal     Neuro/Psych negative neurological ROS  negative psych ROS   GI/Hepatic Neg liver ROS, GERD  ,  Endo/Other  negative endocrine ROS  Renal/GU negative Renal ROS  negative genitourinary   Musculoskeletal  (+) Arthritis , Rheumatoid disorders,    Abdominal   Peds negative pediatric ROS (+)  Hematology negative hematology ROS (+)   Anesthesia Other Findings   Reproductive/Obstetrics negative OB ROS                             Anesthesia Physical Anesthesia Plan  ASA: II  Anesthesia Plan: MAC and Regional   Post-op Pain Management:    Induction: Intravenous  PONV Risk Score and Plan: 3 and Ondansetron, Dexamethasone, Midazolam and Treatment may vary due to age or medical condition  Airway Management Planned: Simple Face Mask  Additional Equipment:   Intra-op Plan:   Post-operative Plan:   Informed Consent: I have reviewed the patients History and Physical, chart, labs and discussed the procedure including the risks, benefits and alternatives for the proposed anesthesia with the patient or authorized representative who has indicated his/her understanding and acceptance.     Dental advisory given  Plan Discussed with: CRNA  Anesthesia Plan Comments:         Anesthesia Quick Evaluation

## 2020-03-23 NOTE — Discharge Instructions (Signed)
Keep bandage clean and dry.  Call for any problems.  No smoking.  Criteria for driving a car: you should be off your pain medicine for 7-8 hours, able to drive one handed(confident), thinking clearly and feeling able in your judgement to drive. Continue elevation as it will decrease swelling.  If instructed by MD move your fingers within the confines of the bandage/splint.  Use ice if instructed by your MD. Call immediately for any sudden loss of feeling in your hand/arm or change in functional abilities of the extremity.We recommend that you to take vitamin C 1000 mg a day to promote healing. We also recommend that if you require  pain medicine that you take a stool softener to prevent constipation as most pain medicines will have constipation side effects. We recommend either Peri-Colace or Senokot and recommend that you also consider adding MiraLAX as well to prevent the constipation affects from pain medicine if you are required to use them. These medicines are over the counter and may be purchased at a local pharmacy. A cup of yogurt and a probiotic can also be helpful during the recovery process as the medicines can disrupt your intestinal environment.  Dr Brunetta Genera cell phone [phone] for any problems   Post Anesthesia Home Care Instructions  Activity: Get plenty of rest for the remainder of the day. A responsible individual must stay with you for 24 hours following the procedure.  For the next 24 hours, DO NOT: -Drive a car -Advertising copywriter -Drink alcoholic beverages -Take any medication unless instructed by your physician -Make any legal decisions or sign important papers.  Meals: Start with liquid foods such as gelatin or soup. Progress to regular foods as tolerated. Avoid greasy, spicy, heavy foods. If nausea and/or vomiting occur, drink only clear liquids until the nausea and/or vomiting subsides. Call your physician if vomiting continues.  Special Instructions/Symptoms: Your  throat may feel dry or sore from the anesthesia or the breathing tube placed in your throat during surgery. If this causes discomfort, gargle with warm salt water. The discomfort should disappear within 24 hours.  If you had a scopolamine patch placed behind your ear for the management of post- operative nausea and/or vomiting:  1. The medication in the patch is effective for 72 hours, after which it should be removed.  Wrap patch in a tissue and discard in the trash. Wash hands thoroughly with soap and water. 2. You may remove the patch earlier than 72 hours if you experience unpleasant side effects which may include dry mouth, dizziness or visual disturbances. 3. Avoid touching the patch. Wash your hands with soap and water after contact with the patch.    Regional Anesthesia Blocks  1. Numbness or the inability to move the "blocked" extremity may last from 3-48 hours after placement. The length of time depends on the medication injected and your individual response to the medication. If the numbness is not going away after 48 hours, call your surgeon.  2. The extremity that is blocked will need to be protected until the numbness is gone and the  Strength has returned. Because you cannot feel it, you will need to take extra care to avoid injury. Because it may be weak, you may have difficulty moving it or using it. You may not know what position it is in without looking at it while the block is in effect.  3. For blocks in the legs and feet, returning to weight bearing and walking needs to be  done carefully. You will need to wait until the numbness is entirely gone and the strength has returned. You should be able to move your leg and foot normally before you try and bear weight or walk. You will need someone to be with you when you first try to ensure you do not fall and possibly risk injury.  4. Bruising and tenderness at the needle site are common side effects and will resolve in a few  days.  5. Persistent numbness or new problems with movement should be communicated to the surgeon or the [facility] [phone] [facility] [phone]).

## 2020-03-23 NOTE — Anesthesia Postprocedure Evaluation (Signed)
Anesthesia Post Note  Patient: Bailey Petersen  Procedure(s) Performed: Right index finger metacarpal phalangeal arthroplasty with repair as necessary and centralization of the extensor tendon and right middle finger proximal interphalangeal arthroplasty with repair as necessary (Right Finger)     Patient location during evaluation: PACU Anesthesia Type: Regional Level of consciousness: awake and alert Pain management: pain level controlled Vital Signs Assessment: post-procedure vital signs reviewed and stable Respiratory status: spontaneous breathing, nonlabored ventilation and respiratory function stable Cardiovascular status: blood pressure returned to baseline and stable Postop Assessment: no apparent nausea or vomiting Anesthetic complications: no   No complications documented.  Last Vitals:  Vitals:   03/23/20 1615 03/23/20 1630  BP: 135/80 (!) 142/69  Pulse:  65  Resp:  16  Temp:  36.4 C  SpO2: 98% 98%    Last Pain:  Vitals:   03/23/20 1630  TempSrc:   PainSc: 0-No pain                 Lowella Curb

## 2020-03-23 NOTE — H&P (Signed)
Bailey Petersen is an 79 y.o. female.   Chief Complaint: Patient presents for right index finger MCP arthroplasty and right middle finger PIP arthroplasty.   HPI: Patient presents for evaluation and treatment of the of their upper extremity predicament. The patient denies neck, back, chest or  abdominal pain. The patient notes that they have no lower extremity problems. The patients primary complaint is noted. We are planning surgical care pathway for the upper extremity.  Past Medical History:  Diagnosis Date  . Arthritis    RA  . Complication of anesthesia   . GERD (gastroesophageal reflux disease)   . History of hiatal hernia   . IBS (irritable bowel syndrome)   . PONV (postoperative nausea and vomiting)     Past Surgical History:  Procedure Laterality Date  . ABDOMINAL HYSTERECTOMY    . EYE SURGERY Bilateral    cataract  . FOOT SURGERY Left   . HAND SURGERY Left   . ORIF SHOULDER FRACTURE Left   . TONSILLECTOMY      History reviewed. No pertinent family history. Social History:  reports that she has never smoked. She has never used smokeless tobacco. She reports current alcohol use. She reports that she does not use drugs.  Allergies:  Allergies  Allergen Reactions  . Penicillins Swelling    reddness    Medications Prior to Admission  Medication Sig Dispense Refill  . conjugated estrogens (PREMARIN) vaginal cream Place 1 Applicatorful vaginally daily.    . [employer] FIBER GUMMY BEARS CHILDREN PO Take by mouth.    . folic acid (FOLVITE) 1 MG tablet Take 1 mg by mouth daily.    Marland Kitchen glucosamine-chondroitin 500-400 MG tablet Take 1 tablet by mouth 3 (three) times daily.    . Multiple Vitamin (MULTIVITAMIN WITH MINERALS) TABS tablet Take 1 tablet by mouth daily.    . naproxen (NAPROSYN) 500 MG tablet Take 500 mg by mouth daily.    . Omega-3 Fatty Acids (FISH OIL BURP-LESS) 1000 MG CAPS Take by mouth in the morning and at bedtime.    . sulfaSALAzine (AZULFIDINE) 500 MG tablet  Take 1,000 mg by mouth 2 (two) times daily.    . Turmeric 500 MG CAPS Take by mouth.      No results found for this or any previous visit (from the past 48 hour(s)). No results found.  Review of Systems  Respiratory: Negative.   Cardiovascular: Negative.   Gastrointestinal: Negative.   Endocrine: Negative.   Genitourinary: Negative.     Blood pressure (!) 143/72, pulse 69, temperature 97.6 F (36.4 C), temperature source Oral, resp. rate 15, height 5\' 4"  (1.626 m), weight 60.2 kg, SpO2 100 %. Physical Exam significant arthritic change about the right index finger MCP joint and right middle finger PIP joint.  We are planning arthroplasties of these areas.  This will be a right index finger MCP arthroplasty with centralization procedure and a right middle finger PIP arthroplasty with repair as necessary.  Once again we will not perform the modified their procedure and we have discussed this preoperatively.  The patient is alert and oriented in no acute distress. The patient complains of pain in the affected upper extremity.  The patient is noted to have a normal HEENT exam. Lung fields show equal chest expansion and no shortness of breath. Abdomen exam is nontender without distention. Lower extremity examination does not show any fracture dislocation or blood clot symptoms. Pelvis is stable and the neck and back are stable and nontender.  Assessment/Plan We will plan for right index finger MCP arthroplasty and right middle finger PIP arthroplasty.  I discussed with patient all issues plans concerns.  We discussed possible modified procedure about the ulna and we will hold off on performing this given the fact that she is only minimally tender today.  We have spoken in regards to this today and on prior occasion.  Thus we will plan to proceed with the arthroplasties about the fingers and move forward with the postop protocol.  We are planning surgery for your upper extremity. The  risk and benefits of surgery to include risk of bleeding, infection, anesthesia,  damage to normal structures and failure of the surgery to accomplish its intended goals of relieving symptoms and restoring function have been discussed in detail. With this in mind we plan to proceed. I have specifically discussed with the patient the pre-and postoperative regime and the dos and don'ts and risk and benefits in great detail. Risk and benefits of surgery also include risk of dystrophy(CRPS), chronic nerve pain, failure of the healing process to go onto completion and other inherent risks of surgery The relavent the pathophysiology of the disease/injury process, as well as the alternatives for treatment and postoperative course of action has been discussed in great detail with the patient who desires to proceed.  We will do everything in our power to help you (the patient) restore function to the upper extremity. It is a pleasure to see this patient today.   Oletta Cohn III, MD 03/23/2020, 1:18 PM

## 2020-03-23 NOTE — Transfer of Care (Signed)
Immediate Anesthesia Transfer of Care Note  Patient: ROY SNUFFER  Procedure(s) Performed: Right index finger metacarpal phalangeal arthroplasty with repair as necessary and centralization of the extensor tendon and right middle finger proximal interphalangeal arthroplasty with repair as necessary (Right Finger)  Patient Location: PACU  Anesthesia Type:MAC combined with regional for post-op pain  Level of Consciousness: awake, alert , oriented and patient cooperative  Airway & Oxygen Therapy: Patient Spontanous Breathing and Patient connected to face mask oxygen  Post-op Assessment: Report given to RN   Post vital signs: Reviewed and stable  Last Vitals:  Vitals Value Taken Time  BP 134/85 03/23/20 1554  Temp    Pulse 77 03/23/20 1558  Resp 15 03/23/20 1558  SpO2 100 % 03/23/20 1558  Vitals shown include unvalidated device data.  Last Pain:  Vitals:   03/23/20 1108  TempSrc: Oral  PainSc: 3       Patients Stated Pain Goal: 3 (57/01/77 9390)  Complications: No complications documented.

## 2020-03-26 ENCOUNTER — Encounter (HOSPITAL_BASED_OUTPATIENT_CLINIC_OR_DEPARTMENT_OTHER): Payer: Self-pay | Admitting: Orthopedic Surgery

## 2020-04-07 DIAGNOSIS — Z4789 Encounter for other orthopedic aftercare: Secondary | ICD-10-CM | POA: Insufficient documentation

## 2020-07-15 ENCOUNTER — Other Ambulatory Visit: Payer: Self-pay | Admitting: Orthopedic Surgery

## 2020-07-15 DIAGNOSIS — M5416 Radiculopathy, lumbar region: Secondary | ICD-10-CM

## 2020-07-19 ENCOUNTER — Ambulatory Visit
Admission: RE | Admit: 2020-07-19 | Discharge: 2020-07-19 | Disposition: A | Discharge status: Home / Self Care | Payer: Medicare Other | Source: Ambulatory Visit | Attending: Orthopedic Surgery | Admitting: Orthopedic Surgery

## 2020-07-19 DIAGNOSIS — M5416 Radiculopathy, lumbar region: Secondary | ICD-10-CM

## 2020-07-19 MED ORDER — METHYLPREDNISOLONE ACETATE 40 MG/ML INJ SUSP (RADIOLOG
120.0000 mg | Freq: Once | INTRAMUSCULAR | Status: AC
  Administered 2020-07-19: 120 mg via EPIDURAL

## 2020-07-19 MED ORDER — IOPAMIDOL (ISOVUE-M 200) INJECTION 41%
1.0000 mL | Freq: Once | INTRAMUSCULAR | Status: AC
  Administered 2020-07-19: 1 mL via EPIDURAL

## 2020-07-19 NOTE — Discharge Instructions (Signed)

## 2020-11-15 ENCOUNTER — Other Ambulatory Visit: Payer: Self-pay | Admitting: Orthopedic Surgery

## 2020-11-15 DIAGNOSIS — M4316 Spondylolisthesis, lumbar region: Secondary | ICD-10-CM

## 2020-11-16 ENCOUNTER — Other Ambulatory Visit: Payer: Self-pay | Admitting: Orthopedic Surgery

## 2020-11-16 ENCOUNTER — Inpatient Hospital Stay: Admission: RE | Admit: 2020-11-16 | Payer: Medicare Other | Source: Ambulatory Visit

## 2020-11-16 DIAGNOSIS — M5416 Radiculopathy, lumbar region: Secondary | ICD-10-CM

## 2020-11-17 ENCOUNTER — Ambulatory Visit
Admission: RE | Admit: 2020-11-17 | Discharge: 2020-11-17 | Disposition: A | Discharge status: Home / Self Care | Payer: Medicare Other | Source: Ambulatory Visit | Attending: Orthopedic Surgery | Admitting: Orthopedic Surgery

## 2020-11-17 ENCOUNTER — Other Ambulatory Visit: Payer: Self-pay

## 2020-11-17 DIAGNOSIS — M4316 Spondylolisthesis, lumbar region: Secondary | ICD-10-CM

## 2020-11-17 MED ORDER — METHYLPREDNISOLONE ACETATE 40 MG/ML INJ SUSP (RADIOLOG
80.0000 mg | Freq: Once | INTRAMUSCULAR | Status: AC
  Administered 2020-11-17: 80 mg via EPIDURAL

## 2020-11-17 MED ORDER — IOPAMIDOL (ISOVUE-M 200) INJECTION 41%
1.0000 mL | Freq: Once | INTRAMUSCULAR | Status: AC
  Administered 2020-11-17: 1 mL via EPIDURAL

## 2020-11-17 NOTE — Discharge Instructions (Signed)

## 2020-12-28 ENCOUNTER — Telehealth: Payer: Self-pay

## 2020-12-28 NOTE — Telephone Encounter (Signed)
Referral notes received from Debbora [facility] Office Phone #: [phone], Fax #: [phone]   A copy of the referral have been placed in the scheduling box for check-out to pick-up and to enter referral. Original notes placed in file cabinet.

## 2021-02-14 ENCOUNTER — Other Ambulatory Visit: Payer: Self-pay | Admitting: Orthopedic Surgery

## 2021-02-14 DIAGNOSIS — M5416 Radiculopathy, lumbar region: Secondary | ICD-10-CM

## 2021-02-21 NOTE — Progress Notes (Deleted)
Referring-Bailey Weeks NP Reason for referral-chest pain  HPI: 80 year old female for evaluation of chest pain at request of Belva Agee NP.  Current Outpatient Medications  Medication Sig Dispense Refill   conjugated estrogens (PREMARIN) vaginal cream Place 1 Applicatorful vaginally daily.     [employer] FIBER GUMMY BEARS CHILDREN PO Take by mouth.     folic acid (FOLVITE) 1 MG tablet Take 1 mg by mouth daily.     glucosamine-chondroitin 500-400 MG tablet Take 1 tablet by mouth 3 (three) times daily.     Multiple Vitamin (MULTIVITAMIN WITH MINERALS) TABS tablet Take 1 tablet by mouth daily.     naproxen (NAPROSYN) 500 MG tablet Take 500 mg by mouth daily.     Omega-3 Fatty Acids (FISH OIL BURP-LESS) 1000 MG CAPS Take by mouth in the morning and at bedtime.     sulfaSALAzine (AZULFIDINE) 500 MG tablet Take 1,000 mg by mouth 2 (two) times daily.     Turmeric 500 MG CAPS Take by mouth.     No current facility-administered medications for this visit.    Allergies  Allergen Reactions   Penicillins Swelling    reddness     Past Medical History:  Diagnosis Date   Arthritis    RA   Complication of anesthesia    GERD (gastroesophageal reflux disease)    History of hiatal hernia    IBS (irritable bowel syndrome)    PONV (postoperative nausea and vomiting)     Past Surgical History:  Procedure Laterality Date   ABDOMINAL HYSTERECTOMY     EYE SURGERY Bilateral    cataract   FINGER ARTHROPLASTY Right 03/23/2020   Procedure: Right index finger metacarpal phalangeal arthroplasty with repair as necessary and centralization of the extensor tendon and right middle finger proximal interphalangeal arthroplasty with repair as necessary;  Surgeon: Dominica Severin, MD;  Location: [facility];  Service: Orthopedics;  Laterality: Right;   FOOT SURGERY Left    HAND SURGERY Left    ORIF SHOULDER FRACTURE Left    TONSILLECTOMY      Social History   Socioeconomic History    Marital status: Married    Spouse name: Not on file   Number of children: Not on file   Years of education: Not on file   Highest education level: Not on file  Occupational History   Not on file  Tobacco Use   Smoking status: Never   Smokeless tobacco: Never  Substance and Sexual Activity   Alcohol use: Yes    Comment: social   Drug use: Never   Sexual activity: Not on file  Other Topics Concern   Not on file  Social History Narrative   Not on file   Social Determinants of Health   Financial Resource Strain: Not on file  Food Insecurity: Not on file  Transportation Needs: Not on file  Physical Activity: Not on file  Stress: Not on file  Social Connections: Not on file  Intimate Partner Violence: Not on file    No family history on file.  ROS: no fevers or chills, productive cough, hemoptysis, dysphasia, odynophagia, melena, hematochezia, dysuria, hematuria, rash, seizure activity, orthopnea, PND, pedal edema, claudication. Remaining systems are negative.  Physical Exam:   There were no vitals taken for this visit.  General:  Well developed/well nourished in NAD Skin warm/dry Patient not depressed No peripheral clubbing Back-normal HEENT-normal/normal eyelids Neck supple/normal carotid upstroke bilaterally; no bruits; no JVD; no thyromegaly chest - CTA/ normal expansion  CV - RRR/normal S1 and S2; no murmurs, rubs or gallops;  PMI nondisplaced Abdomen -NT/ND, no HSM, no mass, + bowel sounds, no bruit 2+ femoral pulses, no bruits Ext-no edema, chords, 2+ DP Neuro-grossly nonfocal  ECG - personally reviewed  A/P  1 chest pain-  Olga Millers, MD

## 2021-02-28 ENCOUNTER — Ambulatory Visit: Payer: Medicare Other | Admitting: Cardiology

## 2021-02-28 ENCOUNTER — Other Ambulatory Visit: Payer: Medicare Other

## 2021-02-28 ENCOUNTER — Other Ambulatory Visit: Payer: Self-pay

## 2021-02-28 ENCOUNTER — Ambulatory Visit
Admission: RE | Admit: 2021-02-28 | Discharge: 2021-02-28 | Disposition: A | Discharge status: Home / Self Care | Payer: Medicare Other | Source: Ambulatory Visit | Attending: Orthopedic Surgery | Admitting: Orthopedic Surgery

## 2021-02-28 DIAGNOSIS — M5416 Radiculopathy, lumbar region: Secondary | ICD-10-CM

## 2021-03-14 ENCOUNTER — Other Ambulatory Visit: Payer: Self-pay

## 2021-03-14 ENCOUNTER — Ambulatory Visit
Admission: RE | Admit: 2021-03-14 | Discharge: 2021-03-14 | Disposition: A | Discharge status: Home / Self Care | Payer: Medicare Other | Source: Ambulatory Visit | Attending: Orthopedic Surgery | Admitting: Orthopedic Surgery

## 2021-03-14 DIAGNOSIS — M5416 Radiculopathy, lumbar region: Secondary | ICD-10-CM

## 2021-03-14 MED ORDER — IOPAMIDOL (ISOVUE-M 200) INJECTION 41%
1.0000 mL | Freq: Once | INTRAMUSCULAR | Status: AC
  Administered 2021-03-14: 1 mL via EPIDURAL

## 2021-03-14 MED ORDER — METHYLPREDNISOLONE ACETATE 40 MG/ML INJ SUSP (RADIOLOG
80.0000 mg | Freq: Once | INTRAMUSCULAR | Status: AC
  Administered 2021-03-14: 80 mg via EPIDURAL

## 2021-03-14 NOTE — Discharge Instructions (Signed)

## 2021-05-04 NOTE — Progress Notes (Signed)
Referring-Susan Weeks NP Reason for referral-chest pain  HPI: 81 year old female for evaluation of chest pain at request of Haynes Hoehn NP.  No prior cardiac history.  Patient states that over the past several months she has had occasional chest pain.  It is in the right breast area radiating to the left side.  It is described as a sharp pain.  It was more severe 2 months ago and could be intermittent for most of the day.  More recently it is shorter lived.  It is not exertional, pleuritic or positional.  No associated symptoms.  Resolve spontaneously.  Otherwise she denies dyspnea on exertion, orthopnea, PND, pedal edema.  Cardiology now asked to evaluate.  Current Outpatient Medications  Medication Sig Dispense Refill   Ascorbic Acid (VITAMIN C GUMMIES PO) Take by mouth.     Cholecalciferol (VITAMIN D3 PO) Take by mouth daily.     CVS FIBER GUMMY BEARS CHILDREN PO Take by mouth.     folic acid (FOLVITE) 1 MG tablet Take 1 mg by mouth daily.     glucosamine-chondroitin 500-400 MG tablet Take 1 tablet by mouth daily.     naproxen (NAPROSYN) 500 MG tablet Take 500 mg by mouth daily.     Omega-3 Fatty Acids (FISH OIL BURP-LESS) 1000 MG CAPS Take by mouth daily.     omeprazole (PRILOSEC) 40 MG capsule omeprazole 40 mg capsule,delayed release     sulfaSALAzine (AZULFIDINE) 500 MG tablet Take 1,000 mg by mouth 2 (two) times daily.     Turmeric 500 MG CAPS Take by mouth.     conjugated estrogens (PREMARIN) vaginal cream Place 1 Applicatorful vaginally daily. (Patient not taking: Reported on 05/18/2021)     Multiple Vitamin (MULTIVITAMIN WITH MINERALS) TABS tablet Take 1 tablet by mouth daily. (Patient not taking: Reported on 05/18/2021)     No current facility-administered medications for this visit.    Allergies  Allergen Reactions   Penicillins Swelling    reddness   Sulfa Antibiotics      Past Medical History:  Diagnosis Date   Arthritis    RA   Complication of anesthesia    GERD  (gastroesophageal reflux disease)    History of hiatal hernia    IBS (irritable bowel syndrome)    PONV (postoperative nausea and vomiting)    Rheumatoid arthritis ([place])     Past Surgical History:  Procedure Laterality Date   ABDOMINAL HYSTERECTOMY     EYE SURGERY Bilateral    cataract   FINGER ARTHROPLASTY Right 03/23/2020   Procedure: Right index finger metacarpal phalangeal arthroplasty with repair as necessary and centralization of the extensor tendon and right middle finger proximal interphalangeal arthroplasty with repair as necessary;  Surgeon: Roseanne Kaufman, MD;  Location: [facility];  Service: Orthopedics;  Laterality: Right;   FOOT SURGERY Left    HAND SURGERY Left    ORIF SHOULDER FRACTURE Left    TONSILLECTOMY      Social History   Socioeconomic History   Marital status: Married    Spouse name: Not on file   Number of children: 1   Years of education: Not on file   Highest education level: Not on file  Occupational History   Not on file  Tobacco Use   Smoking status: Never   Smokeless tobacco: Never  Substance and Sexual Activity   Alcohol use: Yes    Comment: social   Drug use: Never   Sexual activity: Not on file  Other  Topics Concern   Not on file  Social History Narrative   Not on file   Social Determinants of Health   Financial Resource Strain: Not on file  Food Insecurity: Not on file  Transportation Needs: Not on file  Physical Activity: Not on file  Stress: Not on file  Social Connections: Not on file  Intimate Partner Violence: Not on file    Family History  Problem Relation Age of Onset   Rheumatic fever Mother     ROS: no fevers or chills, productive cough, hemoptysis, dysphasia, odynophagia, melena, hematochezia, dysuria, hematuria, rash, seizure activity, orthopnea, PND, pedal edema, claudication. Remaining systems are negative.  Physical Exam:   Blood pressure 118/72, pulse 65, height 5\' 4"  (1.626 m), weight 141  lb 3.2 oz (64 kg), SpO2 97 %.  General:  Well developed/well nourished in NAD Skin warm/dry Patient not depressed No peripheral clubbing Back-normal HEENT-normal/normal eyelids Neck supple/normal carotid upstroke bilaterally; no bruits; no JVD; no thyromegaly chest - CTA/ normal expansion CV - RRR/normal S1 and S2; no murmurs, rubs or gallops;  PMI nondisplaced Abdomen -NT/ND, no HSM, no mass, + bowel sounds, no bruit 2+ femoral pulses, no bruits Ext-no edema, chords, 2+ DP Neuro-grossly nonfocal  ECG -normal sinus rhythm at a rate of 65, no ST changes.  Personally reviewed  A/P  1 chest pain-symptoms are atypical.  Electrocardiogram shows no ST changes.  I will arrange a stress nuclear study to screen for ischemia.  2 rheumatoid arthritis-Per primary care.  Kirk Ruths, MD

## 2021-05-18 ENCOUNTER — Encounter: Payer: Self-pay | Admitting: Cardiology

## 2021-05-18 ENCOUNTER — Ambulatory Visit (INDEPENDENT_AMBULATORY_CARE_PROVIDER_SITE_OTHER): Payer: Medicare Other | Admitting: Cardiology

## 2021-05-18 ENCOUNTER — Other Ambulatory Visit: Payer: Self-pay

## 2021-05-18 VITALS — BP 118/72 | HR 65 | Ht 64.0 in | Wt 141.2 lb

## 2021-05-18 DIAGNOSIS — R072 Precordial pain: Secondary | ICD-10-CM | POA: Diagnosis not present

## 2021-05-18 NOTE — Patient Instructions (Signed)
°  Testing/Procedures:  Your physician has requested that you have en exercise stress myoview. For further information please visit [email or web address] Please follow instruction sheet, as given. [address]   Follow-Up: At [facility], you and your health needs are our priority.  As part of our continuing mission to provide you with exceptional heart care, we have created designated Provider Care Teams.  These Care Teams include your primary Cardiologist (physician) and Advanced Practice Providers (APPs -  Physician Assistants and Nurse Practitioners) who all work together to provide you with the care you need, when you need it.  We recommend signing up for the patient portal called "MyChart".  Sign up information is provided on this After Visit Summary.  MyChart is used to connect with patients for Virtual Visits (Telemedicine).  Patients are able to view lab/test results, encounter notes, upcoming appointments, etc.  Non-urgent messages can be sent to your provider as well.   To learn more about what you can do with MyChart, go to [email or web address].    Your next appointment:    AS NEEDED

## 2021-05-24 ENCOUNTER — Other Ambulatory Visit: Payer: Self-pay | Admitting: Orthopedic Surgery

## 2021-05-24 DIAGNOSIS — M5451 Vertebrogenic low back pain: Secondary | ICD-10-CM

## 2021-05-31 ENCOUNTER — Other Ambulatory Visit: Payer: Self-pay

## 2021-05-31 ENCOUNTER — Ambulatory Visit
Admission: RE | Admit: 2021-05-31 | Discharge: 2021-05-31 | Disposition: A | Discharge status: Home / Self Care | Payer: Medicare Other | Source: Ambulatory Visit | Attending: Orthopedic Surgery | Admitting: Orthopedic Surgery

## 2021-05-31 DIAGNOSIS — M5451 Vertebrogenic low back pain: Secondary | ICD-10-CM

## 2021-05-31 MED ORDER — METHYLPREDNISOLONE ACETATE 40 MG/ML INJ SUSP (RADIOLOG
80.0000 mg | Freq: Once | INTRAMUSCULAR | Status: AC
  Administered 2021-05-31: 80 mg via EPIDURAL

## 2021-05-31 MED ORDER — IOPAMIDOL (ISOVUE-M 200) INJECTION 41%
1.0000 mL | Freq: Once | INTRAMUSCULAR | Status: AC
  Administered 2021-05-31: 1 mL via EPIDURAL

## 2021-05-31 NOTE — Discharge Instructions (Signed)

## 2021-06-15 ENCOUNTER — Encounter (HOSPITAL_COMMUNITY): Payer: Medicare Other

## 2021-06-24 ENCOUNTER — Telehealth (HOSPITAL_COMMUNITY): Payer: Self-pay | Admitting: *Deleted

## 2021-06-24 NOTE — Telephone Encounter (Signed)
Patient given detailed instructions per Myocardial Perfusion Study Information Sheet for the test on 07/01/21 at 10:00. Patient notified to arrive 15 minutes early and that it is imperative to arrive on time for appointment to keep from having the test rescheduled.  If you need to cancel or reschedule your appointment, please call the office within 24 hours of your appointment. . Patient verbalized understanding.Bailey Petersen

## 2021-07-01 ENCOUNTER — Other Ambulatory Visit: Payer: Self-pay

## 2021-07-01 ENCOUNTER — Ambulatory Visit (HOSPITAL_COMMUNITY): Payer: Medicare Other | Attending: Cardiovascular Disease

## 2021-07-01 DIAGNOSIS — R072 Precordial pain: Secondary | ICD-10-CM | POA: Diagnosis present

## 2021-07-01 LAB — MYOCARDIAL PERFUSION IMAGING
Angina Index: 0
Duke Treadmill Score: 7
Estimated workload: 7.7
Exercise duration (min): 6 min
Exercise duration (sec): 30 s
LV dias vol: 50 mL (ref 46–106)
LV sys vol: 12 mL
MPHR: 141 {beats}/min
Nuc Stress EF: 75 %
Peak HR: 144 {beats}/min
Percent HR: 102 %
Rest HR: 76 {beats}/min
Rest Nuclear Isotope Dose: 10.6 mCi
SDS: 0
SRS: 0
SSS: 0
ST Depression (mm): 0 mm
Stress Nuclear Isotope Dose: 31.7 mCi
TID: 0.91

## 2021-07-01 MED ORDER — TECHNETIUM TC 99M TETROFOSMIN IV KIT
10.6000 | PACK | Freq: Once | INTRAVENOUS | Status: AC | PRN
  Administered 2021-07-01: 10.6 via INTRAVENOUS
  Filled 2021-07-01: qty 11

## 2021-07-01 MED ORDER — TECHNETIUM TC 99M TETROFOSMIN IV KIT
31.7000 | PACK | Freq: Once | INTRAVENOUS | Status: AC | PRN
  Administered 2021-07-01: 31.7 via INTRAVENOUS
  Filled 2021-07-01: qty 32

## 2021-08-30 DIAGNOSIS — M533 Sacrococcygeal disorders, not elsewhere classified: Secondary | ICD-10-CM | POA: Insufficient documentation

## 2021-09-05 DIAGNOSIS — M533 Sacrococcygeal disorders, not elsewhere classified: Secondary | ICD-10-CM | POA: Insufficient documentation

## 2021-12-21 DIAGNOSIS — M67449 Ganglion, unspecified hand: Secondary | ICD-10-CM | POA: Insufficient documentation

## 2022-03-01 DIAGNOSIS — M65939 Unspecified synovitis and tenosynovitis, unspecified forearm: Secondary | ICD-10-CM | POA: Insufficient documentation

## 2022-03-01 DIAGNOSIS — G5601 Carpal tunnel syndrome, right upper limb: Secondary | ICD-10-CM | POA: Insufficient documentation

## 2022-04-05 DIAGNOSIS — L570 Actinic keratosis: Secondary | ICD-10-CM | POA: Insufficient documentation

## 2022-12-25 DIAGNOSIS — M1611 Unilateral primary osteoarthritis, right hip: Secondary | ICD-10-CM | POA: Insufficient documentation

## 2022-12-25 DIAGNOSIS — M1712 Unilateral primary osteoarthritis, left knee: Secondary | ICD-10-CM | POA: Insufficient documentation

## 2023-02-21 ENCOUNTER — Other Ambulatory Visit (INDEPENDENT_AMBULATORY_CARE_PROVIDER_SITE_OTHER): Payer: Medicare Other

## 2023-02-21 ENCOUNTER — Ambulatory Visit: Payer: Medicare Other | Admitting: Orthopedic Surgery

## 2023-02-21 DIAGNOSIS — M25511 Pain in right shoulder: Secondary | ICD-10-CM

## 2023-02-21 DIAGNOSIS — M19011 Primary osteoarthritis, right shoulder: Secondary | ICD-10-CM | POA: Diagnosis not present

## 2023-02-21 NOTE — Progress Notes (Signed)
Office Visit Note   Patient: Bailey Petersen           Date of Birth: [birth year]           MRN: [id number] Visit Date: 02/21/2023 Requested by: Lorelei Pont, DO [address] [place],  [place] [address] PCP: Lorelei Pont, DO  Subjective: Chief Complaint  Patient presents with   Right Shoulder - Pain    HPI: Bailey Petersen is a 82 y.o. female who presents to the office reporting right shoulder pain.  Been going on about 9 months.  No history of injury.  Was injected 2 weeks ago by Dr. Carola Frost with good relief.  Now the pain has recurred and the pain is waking her from sleep at night.  Does have a history of rheumatoid arthritis being treated at [facility] [place] by Dr. Rosette Reveal.  She is left-hand dominant.  Denies any numbness and tingling or scapular pain.  Has been doing a home exercise program with a trainer.  First injection lasted several months but the most recent injected lasted only several weeks.  Most of her pain is superior and anterior.  She has a wedding to go to for her daughter in [place].  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  Also takes naproxen with some relief.  Had successful left shoulder fracture fixation 10 years ago.  She has a husband at home.  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  Takes sulfasalazine for her rheumatoid arthritis but no other immunosuppressants..                ROS: All systems reviewed are negative as they relate to the chief complaint within the history of present illness.  Patient denies fevers or chills.  Assessment & Plan: Visit Diagnoses:  1. 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  Plan a CT scan right shoulder for patient specific instrumentation.  Particularly important in this case where there is likely some glenoid deformity.  The risk and benefits of reverse shoulder replacement  discussed with the patient include not limited to infection nerve vessel damage incomplete pain relief as well as incomplete restoration of function.  Patient understands and wishes to proceed.  Pain is the biggest problem for [name] at this time.  All questions answered.  She will likely have this done in early January.  Follow-Up Instructions: No follow-ups on file.   Orders:  Orders Placed This Encounter  Procedures   XR Shoulder Right   No orders of the defined types were placed in this encounter.     Procedures: No procedures performed   Clinical Data: No additional findings.  Objective: Vital Signs: There were no vitals taken for this visit.  Physical Exam:  Constitutional: Patient appears well-developed HEENT:  Head: Normocephalic Eyes:EOM are normal Neck: Normal range of motion Cardiovascular: Normal rate Pulmonary/chest: Effort normal Neurologic: Patient is alert Skin: Skin is warm Psychiatric: Patient has normal mood and affect  Ortho Exam: Ortho exam demonstrates range of motion on the left at 25/90/150.  On the right range of motion passively is 40/90/160.  Has pretty good subscap strength but slightly weaker posterior superior rotator cuff strength testing at 5 months out of 5.  Does have coarseness and grinding on the right with passive range of motion.  She does have active forward flexion and  abduction above 90 degrees but slightly weaker on the right compared to the left.  Specialty Comments:  No specialty comments available.  Imaging: XR Shoulder Right  Result Date: 02/21/2023 AP lateral merchant radiographs right shoulder reviewed.  Glenohumeral joint arthritis end-stage is present.  There is also superior migration of the humeral head consistent with rotator cuff arthropathy.  No acute fracture.  Shoulder is located.  Visualized lung fields intact.  Moderate AC joint arthritis.    PMFS History: There are no problems to display for this  patient.  Past Medical History:  Diagnosis Date   Arthritis    RA   Complication of anesthesia    GERD (gastroesophageal reflux disease)    History of hiatal hernia    IBS (irritable bowel syndrome)    PONV (postoperative nausea and vomiting)    Rheumatoid arthritis ([facility])     Family History  Problem Relation Age of Onset   Rheumatic fever Mother     Past Surgical History:  Procedure Laterality Date   ABDOMINAL HYSTERECTOMY     EYE SURGERY Bilateral    cataract   FINGER ARTHROPLASTY Right 03/23/2020   Procedure: Right index finger metacarpal phalangeal arthroplasty with repair as necessary and centralization of the extensor tendon and right middle finger proximal interphalangeal arthroplasty with repair as necessary;  Surgeon: Dominica Severin, MD;  Location: [facility];  Service: Orthopedics;  Laterality: Right;   FOOT SURGERY Left    HAND SURGERY Left    ORIF SHOULDER FRACTURE Left    TONSILLECTOMY     Social History   Occupational History   Not on file  Tobacco Use   Smoking status: Never   Smokeless tobacco: Never  Substance and Sexual Activity   Alcohol use: Yes    Comment: social   Drug use: Never   Sexual activity: Not on file

## 2023-02-22 ENCOUNTER — Ambulatory Visit
Admission: RE | Admit: 2023-02-22 | Discharge: 2023-02-22 | Disposition: A | Discharge status: Home / Self Care | Payer: Medicare Other | Source: Ambulatory Visit | Attending: Orthopedic Surgery | Admitting: Orthopedic Surgery

## 2023-02-22 DIAGNOSIS — M25511 Pain in right shoulder: Secondary | ICD-10-CM

## 2023-02-24 ENCOUNTER — Encounter: Payer: Self-pay | Admitting: Orthopedic Surgery

## 2023-03-20 ENCOUNTER — Telehealth: Payer: Self-pay

## 2023-03-20 NOTE — Progress Notes (Signed)
If she is not posted yet can you  check and then send me a reminder to post her  for January.  Thanks

## 2023-03-20 NOTE — Telephone Encounter (Signed)
Eunice Blase has the surgery sheet already and will call the patient for a date in January.

## 2023-03-20 NOTE — Telephone Encounter (Signed)
-----   Message from Burnard Bunting sent at 03/20/2023 12:12 PM EST ----- If she is not posted yet can you  check and then send me a reminder to post her  for January.  Thanks

## 2023-04-30 NOTE — Pre-Procedure Instructions (Signed)
 Surgical Instructions   Your procedure is scheduled on Thursday, January 9th. Report to [facility] Main Entrance A at 09:30 A.M., then check in with the Admitting office. Any questions or running late day of surgery: call [phone]  Questions prior to your surgery date: call [phone], Monday-Friday, 8am-4pm. If you experience any cold or flu symptoms such as cough, fever, chills, shortness of breath, etc. between now and your scheduled surgery, please notify us  at the above number.     Remember:  Do not eat after midnight the night before your surgery  You may drink clear liquids until 08:30 AM the morning of your surgery.   Clear liquids allowed are: Water , Non-Citrus Juices (without pulp), Carbonated Beverages, Clear Tea (no milk, honey, etc.), Black Coffee Only (NO MILK, CREAM OR POWDERED CREAMER of any kind), and Gatorade.  Patient Instructions  The night before surgery:  No food after midnight. ONLY clear liquids after midnight  The day of surgery (if you do NOT have diabetes):  Drink ONE (1) Pre-Surgery Clear Ensure by 08:30 AM the morning of surgery. Drink in one sitting. Do not sip.  This drink was given to you during your hospital  pre-op appointment visit.  Nothing else to drink after completing the  Pre-Surgery Clear Ensure.          If you have questions, please contact your surgeon's office.    Take these medicines the morning of surgery with A SIP OF WATER   May take these medicines IF NEEDED: cetirizine (ZYRTEC)    One week prior to surgery, STOP taking any Aspirin  (unless otherwise instructed by your surgeon) Aleve , Naproxen , Ibuprofen, Motrin, Advil, Goody's, BC's, all herbal medications, fish oil, and non-prescription vitamins. This includes diclofenac  Sodium (VOLTAREN  ARTHRITIS PAIN) gel.                      Do NOT Smoke (Tobacco/Vaping) for 24 hours prior to your procedure.  If you use a CPAP at night, you may bring your mask/headgear for your  overnight stay.   You will be asked to remove any contacts, glasses, piercing's, hearing aid's, dentures/partials prior to surgery. Please bring cases for these items if needed.    Patients discharged the day of surgery will not be allowed to drive home, and someone needs to stay with them for 24 hours.  SURGICAL WAITING ROOM VISITATION Patients may have no more than 2 support people in the waiting area - these visitors may rotate.   Pre-op nurse will coordinate an appropriate time for 1 ADULT support person, who may not rotate, to accompany patient in pre-op.  Children under the age of 13 must have an adult with them who is not the patient and must remain in the main waiting area with an adult.  If the patient needs to stay at the hospital during part of their recovery, the visitor guidelines for inpatient rooms apply.  Please refer to the [facility] website for the visitor guidelines for any additional information.   If you received a COVID test during your pre-op visit  it is requested that you wear a mask when out in public, stay away from anyone that may not be feeling well and notify your surgeon if you develop symptoms. If you have been in contact with anyone that has tested positive in the last 10 days please notify you surgeon.      Pre-operative 5 CHG Bathing Instructions   You can play a key role in  reducing the risk of infection after surgery. Your skin needs to be as free of germs as possible. You can reduce the number of germs on your skin by washing with CHG (chlorhexidine  gluconate) soap before surgery. CHG is an antiseptic soap that kills germs and continues to kill germs even after washing.   DO NOT use if you have an allergy to chlorhexidine /CHG or antibacterial soaps. If your skin becomes reddened or irritated, stop using the CHG and notify one of our RNs at [phone].   Please shower with the CHG soap starting 4 days before surgery using the following schedule:      Please keep in mind the following:  DO NOT shave, including legs and underarms, starting the day of your first shower.   You may shave your face at any point before/day of surgery.  Place clean sheets on your bed the day you start using CHG soap. Use a clean washcloth (not used since being washed) for each shower. DO NOT sleep with pets once you start using the CHG.   CHG Shower Instructions:  Wash your face and private area with normal soap. If you choose to wash your hair, wash first with your normal shampoo.  After you use shampoo/soap, rinse your hair and body thoroughly to remove shampoo/soap residue.  Turn the water  OFF and apply about 3 tablespoons (45 ml) of CHG soap to a CLEAN washcloth.  Apply CHG soap ONLY FROM YOUR NECK DOWN TO YOUR TOES (washing for 3-5 minutes)  DO NOT use CHG soap on face, private areas, open wounds, or sores.  Pay special attention to the area where your surgery is being performed.  If you are having back surgery, having someone wash your back for you may be helpful. Wait 2 minutes after CHG soap is applied, then you may rinse off the CHG soap.  Pat dry with a clean towel  Put on clean clothes/pajamas   If you choose to wear lotion, please use ONLY the CHG-compatible lotions on the back of this paper.   Additional instructions for the day of surgery: DO NOT APPLY any lotions, deodorants, cologne, or perfumes.   Do not bring valuables to the hospital. [facility] is not responsible for any belongings/valuables. Do not wear nail polish, gel polish, artificial nails, or any other type of covering on natural nails (fingers and toes) Do not wear jewelry or makeup Put on clean/comfortable clothes.  Please brush your teeth.  Ask your nurse before applying any prescription medications to the skin.     CHG Compatible Lotions   Aveeno Moisturizing lotion  Cetaphil Moisturizing Cream  Cetaphil Moisturizing Lotion  Clairol Herbal Essence Moisturizing  Lotion, Dry Skin  Clairol Herbal Essence Moisturizing Lotion, Extra Dry Skin  Clairol Herbal Essence Moisturizing Lotion, Normal Skin  Curel Age Defying Therapeutic Moisturizing Lotion with Alpha Hydroxy  Curel Extreme Care Body Lotion  Curel Soothing Hands Moisturizing Hand Lotion  Curel Therapeutic Moisturizing Cream, Fragrance-Free  Curel Therapeutic Moisturizing Lotion, Fragrance-Free  Curel Therapeutic Moisturizing Lotion, Original Formula  Eucerin Daily Replenishing Lotion  Eucerin Dry Skin Therapy Plus Alpha Hydroxy Crme  Eucerin Dry Skin Therapy Plus Alpha Hydroxy Lotion  Eucerin Original Crme  Eucerin Original Lotion  Eucerin Plus Crme Eucerin Plus Lotion  Eucerin TriLipid Replenishing Lotion  Keri Anti-Bacterial Hand Lotion  Keri Deep Conditioning Original Lotion Dry Skin Formula Softly Scented  Keri Deep Conditioning Original Lotion, Fragrance Free Sensitive Skin Formula  Keri Lotion Fast Absorbing Fragrance Free Sensitive Skin  Formula  Keri Lotion Fast Absorbing Softly Scented Dry Skin Formula  Keri Original Lotion  Keri Skin Renewal Lotion Keri Silky Smooth Lotion  Keri Silky Smooth Sensitive Skin Lotion  Nivea Body Creamy Conditioning Oil  Nivea Body Extra Enriched Lotion  Nivea Body Original Lotion  Nivea Body Sheer Moisturizing Lotion Nivea Crme  Nivea Skin Firming Lotion  NutraDerm 30 Skin Lotion  NutraDerm Skin Lotion  NutraDerm Therapeutic Skin Cream  NutraDerm Therapeutic Skin Lotion  ProShield Protective Hand Cream  Provon moisturizing lotion  Please read over the following fact sheets that you were given.    Benzoyl Peroxide Gel  o Reduces the number of germs present on the skin  o Applied twice a day to shoulder area starting two days before surgery   Please follow these instructions carefully:  BENZOYL PEROXIDE 5% GEL  Please do not use if you have an allergy to benzoyl peroxide. If your skin becomes reddened/irritated stop using the  benzoyl peroxide.  Starting two days before surgery, apply as follows:  1. Apply benzoyl peroxide in the morning and at night. Apply after taking a shower. If you are not taking a shower clean entire shoulder front, back, and side along with the armpit with a clean wet washcloth.  2. Place a quarter-sized dollop on your SHOULDER and rub in thoroughly, making sure to cover the front, back, and side of your shoulder, along with the armpit.   2 Days prior to Surgery First Dose on _____________ Morning Second Dose on ______________ Night  Day Before Surgery First Dose on ______________ Morning Night before surgery wash (entire body except face and private areas) with CHG Soap THEN Second Dose on ____________ Night   Morning of Surgery  wash BODY AGAIN with CHG Soap    Do NOT apply benzoyl peroxide gel on the day of surgery

## 2023-05-01 ENCOUNTER — Other Ambulatory Visit: Payer: Self-pay

## 2023-05-01 ENCOUNTER — Encounter (HOSPITAL_COMMUNITY): Payer: Self-pay

## 2023-05-01 ENCOUNTER — Other Ambulatory Visit (HOSPITAL_COMMUNITY): Payer: Medicare Other

## 2023-05-01 ENCOUNTER — Encounter (HOSPITAL_COMMUNITY)
Admission: RE | Admit: 2023-05-01 | Discharge: 2023-05-01 | Disposition: A | Discharge status: Home / Self Care | Payer: Medicare Other | Source: Ambulatory Visit | Attending: Orthopedic Surgery | Admitting: Orthopedic Surgery

## 2023-05-01 VITALS — BP 145/75 | HR 67 | Temp 97.6°F | Resp 17 | Ht 63.0 in | Wt 132.7 lb

## 2023-05-01 DIAGNOSIS — Z01818 Encounter for other preprocedural examination: Secondary | ICD-10-CM

## 2023-05-01 DIAGNOSIS — Z01812 Encounter for preprocedural laboratory examination: Secondary | ICD-10-CM | POA: Diagnosis present

## 2023-05-01 LAB — URINALYSIS, W/ REFLEX TO CULTURE (INFECTION SUSPECTED)
Bacteria, UA: NONE SEEN
Bilirubin Urine: NEGATIVE
Glucose, UA: NEGATIVE mg/dL
Hgb urine dipstick: NEGATIVE
Ketones, ur: NEGATIVE mg/dL
Nitrite: NEGATIVE
Protein, ur: NEGATIVE mg/dL
Specific Gravity, Urine: 1.018 (ref 1.005–1.030)
pH: 5 (ref 5.0–8.0)

## 2023-05-01 LAB — CBC
HCT: 38.8 % (ref 36.0–46.0)
Hemoglobin: 12.1 g/dL (ref 12.0–15.0)
MCH: 29.9 pg (ref 26.0–34.0)
MCHC: 31.2 g/dL (ref 30.0–36.0)
MCV: 95.8 fL (ref 80.0–100.0)
Platelets: 263 10*3/uL (ref 150–400)
RBC: 4.05 MIL/uL (ref 3.87–5.11)
RDW: 14.4 % (ref 11.5–15.5)
WBC: 5.9 10*3/uL (ref 4.0–10.5)
nRBC: 0 % (ref 0.0–0.2)

## 2023-05-01 LAB — BASIC METABOLIC PANEL
Anion gap: 10 (ref 5–15)
BUN: 22 mg/dL (ref 8–23)
CO2: 27 mmol/L (ref 22–32)
Calcium: 10 mg/dL (ref 8.9–10.3)
Chloride: 103 mmol/L (ref 98–111)
Creatinine, Ser: 0.86 mg/dL (ref 0.44–1.00)
GFR, Estimated: >60 mL/min (ref >60)
Glucose, Bld: 83 mg/dL (ref 70–99)
Potassium: 4.6 mmol/L (ref 3.5–5.1)
Sodium: 140 mmol/L (ref 135–145)

## 2023-05-01 LAB — SURGICAL PCR SCREEN
MRSA, PCR: NEGATIVE
Staphylococcus aureus: NEGATIVE

## 2023-05-01 NOTE — Progress Notes (Signed)
 PCP - Dr. Oneil Robins Cardiologist - denies Rheumatologist- Dr. Alan Dillon  PPM/ICD - denies   Chest x-ray - 06/25/22 EKG - 06/25/22- tracing requested Stress Test - 07/01/21 ECHO - denies Cardiac Cath - denies  Sleep Study - denies   DM- denies  Last dose of GLP1 agonist-  n/a  ASA/Blood Thinner Instructions: n/a   ERAS Protcol - yes PRE-SURGERY Ensure given at PAT  COVID TEST- n/a   Anesthesia review: yes, EKG tracing requested  Patient denies shortness of breath, fever, cough and chest pain at PAT appointment   All instructions explained to the patient, with a verbal understanding of the material. Patient agrees to go over the instructions while at home for a better understanding. The opportunity to ask questions was provided.

## 2023-05-09 NOTE — Anesthesia Preprocedure Evaluation (Addendum)
 Anesthesia Evaluation  Patient identified by MRN, date of birth, ID band Patient awake    Reviewed: Allergy & Precautions, NPO status , [name] Chart, lab work & pertinent test results  History of Anesthesia Complications (+) PONV and history of anesthetic complications  Airway Mallampati: II  TM Distance: >3 FB Neck ROM: Full    Dental no notable dental hx. (+) Teeth Intact, Dental Advisory Given   Pulmonary neg pulmonary ROS   Pulmonary exam normal breath sounds clear to auscultation       Cardiovascular negative cardio ROS Normal cardiovascular exam Rhythm:Regular Rate:Normal     Neuro/Psych negative neurological ROS  negative psych ROS   GI/Hepatic Neg liver ROS, hiatal hernia,GERD  ,,  Endo/Other  negative endocrine ROS    Renal/GU negative Renal ROS  negative genitourinary   Musculoskeletal  (+) Arthritis , Rheumatoid disorders,    Abdominal   Peds  Hematology negative hematology ROS (+)   Anesthesia Other Findings   Reproductive/Obstetrics                             Anesthesia Physical Anesthesia Plan  ASA: 2  Anesthesia Plan: General and Regional   Post-op Pain Management: Regional block* and Tylenol  PO (pre-op)*   Induction: Intravenous  PONV Risk Score and Plan: 4 or greater and Dexamethasone , Ondansetron , Treatment may vary due to age or medical condition and TIVA  Airway Management Planned: Oral ETT  Additional Equipment:   Intra-op Plan:   Post-operative Plan: Extubation in OR  Informed Consent: I have reviewed the patients History and Physical, chart, labs and discussed the procedure including the risks, benefits and alternatives for the proposed anesthesia with the patient or authorized representative who has indicated his/her understanding and acceptance.     Dental advisory given  Plan Discussed with: CRNA  Anesthesia Plan Comments:         Anesthesia Quick Evaluation

## 2023-05-10 ENCOUNTER — Encounter (HOSPITAL_COMMUNITY)
Admission: RE | Disposition: A | Discharge status: Home / Self Care | Payer: Self-pay | Source: Home / Self Care | Attending: Orthopedic Surgery

## 2023-05-10 ENCOUNTER — Other Ambulatory Visit: Payer: Self-pay

## 2023-05-10 ENCOUNTER — Observation Stay (HOSPITAL_COMMUNITY)
Admission: RE | Admit: 2023-05-10 | Discharge: 2023-05-11 | Disposition: A | Discharge status: Home / Self Care | Payer: Medicare Other | Attending: Orthopedic Surgery | Admitting: Orthopedic Surgery

## 2023-05-10 ENCOUNTER — Encounter (HOSPITAL_COMMUNITY): Payer: Self-pay | Admitting: Orthopedic Surgery

## 2023-05-10 ENCOUNTER — Observation Stay (HOSPITAL_COMMUNITY): Payer: Medicare Other

## 2023-05-10 ENCOUNTER — Ambulatory Visit (HOSPITAL_COMMUNITY): Payer: Medicare Other | Admitting: Anesthesiology

## 2023-05-10 DIAGNOSIS — M19011 Primary osteoarthritis, right shoulder: Secondary | ICD-10-CM

## 2023-05-10 DIAGNOSIS — Z96611 Presence of right artificial shoulder joint: Secondary | ICD-10-CM

## 2023-05-10 DIAGNOSIS — M7522 Bicipital tendinitis, left shoulder: Secondary | ICD-10-CM | POA: Diagnosis not present

## 2023-05-10 DIAGNOSIS — Z01818 Encounter for other preprocedural examination: Principal | ICD-10-CM

## 2023-05-10 DIAGNOSIS — M75101 Unspecified rotator cuff tear or rupture of right shoulder, not specified as traumatic: Secondary | ICD-10-CM | POA: Diagnosis not present

## 2023-05-10 HISTORY — PX: BICEPT TENODESIS: SHX5116

## 2023-05-10 HISTORY — PX: REVERSE SHOULDER ARTHROPLASTY: SHX5054

## 2023-05-10 SURGERY — ARTHROPLASTY, SHOULDER, TOTAL, REVERSE
Anesthesia: Regional | Site: Shoulder | Laterality: Right

## 2023-05-10 MED ORDER — PROPOFOL 10 MG/ML IV BOLUS
INTRAVENOUS | Status: DC | PRN
  Administered 2023-05-10 (×2): 20 mg via INTRAVENOUS
  Administered 2023-05-10: 100 mg via INTRAVENOUS

## 2023-05-10 MED ORDER — VANCOMYCIN HCL 1000 MG IV SOLR
INTRAVENOUS | Status: DC | PRN
  Administered 2023-05-10: 1000 mg via TOPICAL

## 2023-05-10 MED ORDER — HYDROCODONE-ACETAMINOPHEN 5-325 MG PO TABS: 1.0000 | ORAL_TABLET | ORAL | Status: DC | PRN

## 2023-05-10 MED ORDER — POVIDONE-IODINE 7.5 % EX SOLN: Freq: Once | CUTANEOUS | Status: DC

## 2023-05-10 MED ORDER — LIDOCAINE 2% (20 MG/ML) 5 ML SYRINGE
INTRAMUSCULAR | Status: AC
  Filled 2023-05-10: qty 5

## 2023-05-10 MED ORDER — ACETAMINOPHEN 325 MG PO TABS: 325.0000 mg | ORAL_TABLET | Freq: Four times a day (QID) | ORAL | Status: DC | PRN

## 2023-05-10 MED ORDER — IRRISEPT - 450ML BOTTLE WITH 0.05% CHG IN STERILE WATER, USP 99.95% OPTIME
TOPICAL | Status: DC | PRN
  Administered 2023-05-10: 450 mL via TOPICAL

## 2023-05-10 MED ORDER — PROPOFOL 10 MG/ML IV BOLUS
INTRAVENOUS | Status: AC
  Filled 2023-05-10: qty 20

## 2023-05-10 MED ORDER — ASPIRIN 81 MG PO TBEC
81.0000 mg | DELAYED_RELEASE_TABLET | Freq: Every day | ORAL | Status: DC
  Administered 2023-05-11: 81 mg via ORAL
  Filled 2023-05-10: qty 1

## 2023-05-10 MED ORDER — PHENOL 1.4 % MT LIQD: 1.0000 | OROMUCOSAL | Status: DC | PRN

## 2023-05-10 MED ORDER — ONDANSETRON HCL 4 MG/2ML IJ SOLN
INTRAMUSCULAR | Status: AC
Start: 2023-05-10 — End: ?
  Filled 2023-05-10: qty 2

## 2023-05-10 MED ORDER — ONDANSETRON HCL 4 MG/2ML IJ SOLN
INTRAMUSCULAR | Status: DC | PRN
  Administered 2023-05-10: 4 mg via INTRAVENOUS

## 2023-05-10 MED ORDER — NAPROXEN 250 MG PO TABS
500.0000 mg | ORAL_TABLET | Freq: Every day | ORAL | Status: DC
  Administered 2023-05-11: 500 mg via ORAL
  Filled 2023-05-10: qty 2

## 2023-05-10 MED ORDER — DEXAMETHASONE SODIUM PHOSPHATE 10 MG/ML IJ SOLN
INTRAMUSCULAR | Status: DC | PRN
  Administered 2023-05-10: 10 mg via INTRAVENOUS

## 2023-05-10 MED ORDER — DOCUSATE SODIUM 100 MG PO CAPS
100.0000 mg | ORAL_CAPSULE | Freq: Two times a day (BID) | ORAL | Status: DC
  Administered 2023-05-10 – 2023-05-11 (×2): 100 mg via ORAL
  Filled 2023-05-10 (×2): qty 1

## 2023-05-10 MED ORDER — TRANEXAMIC ACID-NACL 1000-0.7 MG/100ML-% IV SOLN
1000.0000 mg | INTRAVENOUS | Status: AC
  Administered 2023-05-10: 1000 mg via INTRAVENOUS
  Filled 2023-05-10: qty 100

## 2023-05-10 MED ORDER — LORATADINE 10 MG PO TABS: 10.0000 mg | ORAL_TABLET | Freq: Every day | ORAL | Status: DC | PRN

## 2023-05-10 MED ORDER — METOCLOPRAMIDE HCL 5 MG PO TABS: 5.0000 mg | ORAL_TABLET | Freq: Three times a day (TID) | ORAL | Status: DC | PRN

## 2023-05-10 MED ORDER — PHENYLEPHRINE 80 MCG/ML (10ML) SYRINGE FOR IV PUSH (FOR BLOOD PRESSURE SUPPORT)
PREFILLED_SYRINGE | INTRAVENOUS | Status: DC | PRN
  Administered 2023-05-10: 160 ug via INTRAVENOUS

## 2023-05-10 MED ORDER — MORPHINE SULFATE (PF) 2 MG/ML IV SOLN: 0.5000 mg | INTRAVENOUS | Status: DC | PRN

## 2023-05-10 MED ORDER — CEFAZOLIN SODIUM-DEXTROSE 2-4 GM/100ML-% IV SOLN
2.0000 g | Freq: Three times a day (TID) | INTRAVENOUS | Status: DC
  Administered 2023-05-10 – 2023-05-11 (×2): 2 g via INTRAVENOUS
  Filled 2023-05-10: qty 100

## 2023-05-10 MED ORDER — LACTATED RINGERS IV SOLN: INTRAVENOUS | Status: DC

## 2023-05-10 MED ORDER — FENTANYL CITRATE (PF) 100 MCG/2ML IJ SOLN: 25.0000 ug | Freq: Once | INTRAMUSCULAR | Status: AC

## 2023-05-10 MED ORDER — CHLORHEXIDINE GLUCONATE 0.12 % MT SOLN
15.0000 mL | Freq: Once | OROMUCOSAL | Status: AC
  Administered 2023-05-10: 15 mL via OROMUCOSAL
  Filled 2023-05-10: qty 15

## 2023-05-10 MED ORDER — FENTANYL CITRATE (PF) 100 MCG/2ML IJ SOLN: 25.0000 ug | INTRAMUSCULAR | Status: DC | PRN

## 2023-05-10 MED ORDER — PROPOFOL 500 MG/50ML IV EMUL
INTRAVENOUS | Status: DC | PRN
  Administered 2023-05-10: 50 ug/kg/min via INTRAVENOUS
  Administered 2023-05-10: 150 ug/kg/min via INTRAVENOUS

## 2023-05-10 MED ORDER — ACETAMINOPHEN 500 MG PO TABS
500.0000 mg | ORAL_TABLET | Freq: Four times a day (QID) | ORAL | Status: DC
  Administered 2023-05-10 – 2023-05-11 (×3): 500 mg via ORAL
  Filled 2023-05-10 (×3): qty 1

## 2023-05-10 MED ORDER — CEFAZOLIN SODIUM-DEXTROSE 2-4 GM/100ML-% IV SOLN
2.0000 g | INTRAVENOUS | Status: AC
  Administered 2023-05-10: 2 g via INTRAVENOUS
  Filled 2023-05-10: qty 100

## 2023-05-10 MED ORDER — EPHEDRINE 5 MG/ML INJ
INTRAVENOUS | Status: AC
  Filled 2023-05-10: qty 5

## 2023-05-10 MED ORDER — EPHEDRINE SULFATE-NACL 50-0.9 MG/10ML-% IV SOSY
PREFILLED_SYRINGE | INTRAVENOUS | Status: DC | PRN
  Administered 2023-05-10 (×4): 5 mg via INTRAVENOUS

## 2023-05-10 MED ORDER — PHENYLEPHRINE HCL-NACL 20-0.9 MG/250ML-% IV SOLN
INTRAVENOUS | Status: DC | PRN
  Administered 2023-05-10: 30 ug/min via INTRAVENOUS

## 2023-05-10 MED ORDER — ACETAMINOPHEN 500 MG PO TABS
1000.0000 mg | ORAL_TABLET | Freq: Once | ORAL | Status: AC
  Administered 2023-05-10: 1000 mg via ORAL
  Filled 2023-05-10: qty 2

## 2023-05-10 MED ORDER — ROCURONIUM BROMIDE 10 MG/ML (PF) SYRINGE
PREFILLED_SYRINGE | INTRAVENOUS | Status: AC
  Filled 2023-05-10: qty 10

## 2023-05-10 MED ORDER — METHOCARBAMOL 500 MG PO TABS: 500.0000 mg | ORAL_TABLET | Freq: Four times a day (QID) | ORAL | Status: DC | PRN

## 2023-05-10 MED ORDER — FENTANYL CITRATE (PF) 250 MCG/5ML IJ SOLN
INTRAMUSCULAR | Status: AC
  Filled 2023-05-10: qty 5

## 2023-05-10 MED ORDER — DEXAMETHASONE SODIUM PHOSPHATE 10 MG/ML IJ SOLN
INTRAMUSCULAR | Status: AC
  Filled 2023-05-10: qty 1

## 2023-05-10 MED ORDER — METOCLOPRAMIDE HCL 5 MG/ML IJ SOLN: 5.0000 mg | Freq: Three times a day (TID) | INTRAMUSCULAR | Status: DC | PRN

## 2023-05-10 MED ORDER — METHOCARBAMOL 1000 MG/10ML IJ SOLN: 500.0000 mg | Freq: Four times a day (QID) | INTRAMUSCULAR | Status: DC | PRN

## 2023-05-10 MED ORDER — MENTHOL 3 MG MT LOZG: 1.0000 | LOZENGE | OROMUCOSAL | Status: DC | PRN

## 2023-05-10 MED ORDER — ROCURONIUM BROMIDE 10 MG/ML (PF) SYRINGE
PREFILLED_SYRINGE | INTRAVENOUS | Status: DC | PRN
  Administered 2023-05-10: 30 mg via INTRAVENOUS

## 2023-05-10 MED ORDER — ONDANSETRON HCL 4 MG PO TABS: 4.0000 mg | ORAL_TABLET | Freq: Four times a day (QID) | ORAL | Status: DC | PRN

## 2023-05-10 MED ORDER — POVIDONE-IODINE 10 % EX SWAB
2.0000 | Freq: Once | CUTANEOUS | Status: AC
  Administered 2023-05-10: 2 via TOPICAL

## 2023-05-10 MED ORDER — ORAL CARE MOUTH RINSE: 15.0000 mL | Freq: Once | OROMUCOSAL | Status: AC

## 2023-05-10 MED ORDER — MIDAZOLAM HCL 2 MG/2ML IJ SOLN
INTRAMUSCULAR | Status: AC
  Filled 2023-05-10: qty 2

## 2023-05-10 MED ORDER — SUGAMMADEX SODIUM 200 MG/2ML IV SOLN
INTRAVENOUS | Status: DC | PRN
  Administered 2023-05-10: 50 mg via INTRAVENOUS

## 2023-05-10 MED ORDER — FENTANYL CITRATE (PF) 250 MCG/5ML IJ SOLN
INTRAMUSCULAR | Status: DC | PRN
  Administered 2023-05-10: 100 ug via INTRAVENOUS

## 2023-05-10 MED ORDER — 0.9 % SODIUM CHLORIDE (POUR BTL) OPTIME
TOPICAL | Status: DC | PRN
  Administered 2023-05-10: 1000 mL

## 2023-05-10 MED ORDER — ALBUMIN HUMAN 5 % IV SOLN: INTRAVENOUS | Status: DC | PRN

## 2023-05-10 MED ORDER — PHENYLEPHRINE 80 MCG/ML (10ML) SYRINGE FOR IV PUSH (FOR BLOOD PRESSURE SUPPORT)
PREFILLED_SYRINGE | INTRAVENOUS | Status: AC
Start: 2023-05-10 — End: ?
  Filled 2023-05-10: qty 10

## 2023-05-10 MED ORDER — FENTANYL CITRATE (PF) 100 MCG/2ML IJ SOLN
INTRAMUSCULAR | Status: AC
  Administered 2023-05-10: 25 ug via INTRAVENOUS
  Filled 2023-05-10: qty 2

## 2023-05-10 SURGICAL SUPPLY — 63 items
ALCOHOL 70% 16 OZ (MISCELLANEOUS) ×1 IMPLANT
BAG COUNTER SPONGE SURGICOUNT (BAG) ×1 IMPLANT
BASEPLATE AUG MED W-TAPER (Plate) IMPLANT
BEARING HUMERAL SHLDER 36M STD (Shoulder) IMPLANT
BIT DRILL 2.7 W/STOP DISP (BIT) IMPLANT
BIT DRILL TWIST 2.7 (BIT) IMPLANT
BLADE SAW SGTL 13X75X1.27 (BLADE) ×1 IMPLANT
CHLORAPREP W/TINT 26 (MISCELLANEOUS) ×1 IMPLANT
COOLER ICEMAN CLASSIC (MISCELLANEOUS) ×1 IMPLANT
COVER SURGICAL LIGHT HANDLE (MISCELLANEOUS) ×1 IMPLANT
DRAPE INCISE IOBAN 66X45 STRL (DRAPES) ×1 IMPLANT
DRAPE U-SHAPE 47X51 STRL (DRAPES) ×2 IMPLANT
DRSG AQUACEL AG ADV 3.5X10 (GAUZE/BANDAGES/DRESSINGS) ×1 IMPLANT
ELECT BLADE 4.0 EZ CLEAN MEGAD (MISCELLANEOUS) ×1
ELECT REM PT RETURN 9FT ADLT (ELECTROSURGICAL) ×1
ELECTRODE BLDE 4.0 EZ CLN MEGD (MISCELLANEOUS) ×1 IMPLANT
ELECTRODE REM PT RTRN 9FT ADLT (ELECTROSURGICAL) ×1 IMPLANT
GAUZE SPONGE 4X4 12PLY STRL LF (GAUZE/BANDAGES/DRESSINGS) ×1 IMPLANT
GLENOID SPHERE 36MM CVD +3 (Orthopedic Implant) IMPLANT
GLOVE BIOGEL PI IND STRL 6.5 (GLOVE) ×1 IMPLANT
GLOVE BIOGEL PI IND STRL 8 (GLOVE) ×1 IMPLANT
GLOVE ECLIPSE 6.5 STRL STRAW (GLOVE) ×1 IMPLANT
GLOVE ECLIPSE 8.0 STRL XLNG CF (GLOVE) ×1 IMPLANT
GOWN STRL REUS W/ TWL LRG LVL3 (GOWN DISPOSABLE) ×1 IMPLANT
GOWN STRL REUS W/ TWL XL LVL3 (GOWN DISPOSABLE) ×1 IMPLANT
GUIDE BONE RSA SHLD ROT RT (ORTHOPEDIC DISPOSABLE SUPPLIES) IMPLANT
HYDROGEN PEROXIDE 16OZ (MISCELLANEOUS) ×1 IMPLANT
JET LAVAGE IRRISEPT WOUND (IRRIGATION / IRRIGATOR) ×1
KIT BASIN OR (CUSTOM PROCEDURE TRAY) ×1 IMPLANT
KIT TURNOVER KIT B (KITS) ×1 IMPLANT
LAVAGE JET IRRISEPT WOUND (IRRIGATION / IRRIGATOR) ×1 IMPLANT
MANIFOLD NEPTUNE II (INSTRUMENTS) ×1 IMPLANT
NDL SUT 6 .5 CRC .975X.05 MAYO (NEEDLE) IMPLANT
NS IRRIG 1000ML POUR BTL (IV SOLUTION) ×1 IMPLANT
PACK SHOULDER (CUSTOM PROCEDURE TRAY) ×1 IMPLANT
PAD COLD SHLDR WRAP-ON (PAD) ×1 IMPLANT
PIN HUMERAL STMN 3.2MMX9IN (INSTRUMENTS) IMPLANT
PIN STEINMANN THREADED TIP (PIN) IMPLANT
REAMER GUIDE BUSHING SURG DISP (MISCELLANEOUS) IMPLANT
REAMER GUIDE W/SCREW AUG (MISCELLANEOUS) IMPLANT
RESTRAINT HEAD UNIVERSAL NS (MISCELLANEOUS) ×1 IMPLANT
RETRIEVER SUT HEWSON (MISCELLANEOUS) ×1 IMPLANT
SCREW BONE STRL 6.5MMX30MM (Screw) IMPLANT
SCREW LOCKING 4.75MMX15MM (Screw) IMPLANT
SCREW LOCKING NS 4.75MMX20MM (Screw) IMPLANT
SCREW LOCKING STRL 4.75X25X3.5 (Screw) IMPLANT
SHOULDER HUMERAL BEAR 36M STD (Shoulder) ×1 IMPLANT
SLING ARM IMMOBILIZER LRG (SOFTGOODS) ×1 IMPLANT
SOL PREP POV-IOD 4OZ 10% (MISCELLANEOUS) ×1 IMPLANT
SPONGE T-LAP 18X18 ~~LOC~~+RFID (SPONGE) ×1 IMPLANT
STEM HUMERAL STRL 12MMX83MM (Stem) IMPLANT
STRIP CLOSURE SKIN 1/2X4 (GAUZE/BANDAGES/DRESSINGS) ×1 IMPLANT
SUCTION TUBE FRAZIER 10FR DISP (SUCTIONS) ×1 IMPLANT
SUT BROADBAND TAPE 2PK 1.5 (SUTURE) IMPLANT
SUT MNCRL AB 3-0 PS2 18 (SUTURE) ×1 IMPLANT
SUT SILK 2 0 TIES 10X30 (SUTURE) ×1 IMPLANT
SUT VIC AB 0 CT1 27XBRD ANBCTR (SUTURE) ×4 IMPLANT
SUT VIC AB 1 CT1 27XBRD ANBCTR (SUTURE) ×2 IMPLANT
SUT VIC AB 2-0 CT1 TAPERPNT 27 (SUTURE) ×3 IMPLANT
SUT VICRYL 0 UR6 27IN ABS (SUTURE) ×2 IMPLANT
TOWEL GREEN STERILE (TOWEL DISPOSABLE) ×1 IMPLANT
TRAY HUM REV SHOULDER STD +6 (Shoulder) IMPLANT
WATER STERILE IRR 1000ML POUR (IV SOLUTION) ×1 IMPLANT

## 2023-05-10 NOTE — Anesthesia Procedure Notes (Signed)
 Procedure Name: Intubation Date/Time: 05/10/2023 11:35 AM  Performed by: Jolynn Mage, CRNAPre-anesthesia Checklist: Patient identified, Patient being monitored, Timeout performed, Emergency Drugs available and Suction available Patient Re-evaluated:Patient Re-evaluated prior to induction Oxygen Delivery Method: Circle System Utilized Preoxygenation: Pre-oxygenation with 100% oxygen Induction Type: IV induction Ventilation: Mask ventilation without difficulty Laryngoscope Size: Miller and 2 Grade View: Grade I Tube type: Oral Tube size: 7.0 mm Number of attempts: 1 Airway Equipment and Method: Stylet Placement Confirmation: ETT inserted through vocal cords under direct vision, positive ETCO2 and breath sounds checked- equal and bilateral Secured at: 21 cm Tube secured with: Tape Dental Injury: Teeth and Oropharynx as per pre-operative assessment

## 2023-05-10 NOTE — Transfer of Care (Signed)
 Immediate Anesthesia Transfer of Care Note  Patient: Bailey Petersen  Procedure(s) Performed: REVERSE SHOULDER ARTHROPLASTY (Right: Shoulder) BICEPS TENODESIS (Right: Shoulder)  Patient Location: PACU  Anesthesia Type:GA combined with regional for post-op pain  Level of Consciousness: awake, alert , and patient cooperative  Airway & Oxygen Therapy: Patient Spontanous Breathing and Patient connected to nasal cannula oxygen  Post-op Assessment: Report given to RN and Post -op Vital signs reviewed and stable  Post vital signs: Reviewed and stable   Last Vitals:  Vitals Value Taken Time  BP 124/72 05/10/23 1440  Temp    Pulse 78 05/10/23 1441  Resp 19 05/10/23 1443  SpO2 100 % 05/10/23 1441  Vitals shown include unfiled device data.  Last Pain:  Vitals:   05/10/23 0944  TempSrc:   PainSc: 5       Patients Stated Pain Goal: 1 (05/10/23 0944)  Complications: No notable events documented.

## 2023-05-10 NOTE — H&P (Signed)
 Bailey Petersen is an 83 y.o. female.   Chief Complaint: Right shoulder pain HPI: Bailey Petersen is a 83 y.o. female who presents  reporting right shoulder pain.  Been going on about 11 months.  No history of injury.  Was injected 3 months weeks ago by Dr. Celena with good relief.  Now the pain has recurred and the pain is waking her from sleep at night.  Does have a history of rheumatoid arthritis being treated at [facility]  by Dr. India.  She is left-hand dominant.  Denies any numbness and tingling or scapular pain.  Has been doing a home exercise program with a trainer.  First injection lasted several months but the most recent injected lasted only several weeks.  Most of her pain is superior and anterior.  She has a [place] to go to for her [place] in [place].  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  Also takes naproxen  with some relief.  Had successful left shoulder fracture fixation 10 years ago.  She has a husband at home.  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  Takes sulfasalazine  for her rheumatoid arthritis but no other immunosuppressants..    Past Medical History:  Diagnosis Date   Arthritis    RA   Complication of anesthesia    GERD (gastroesophageal reflux disease)    History of hiatal hernia    IBS (irritable bowel syndrome)    PONV (postoperative nausea and vomiting)    Rheumatoid arthritis (HCC)     Past Surgical History:  Procedure Laterality Date   ABDOMINAL HYSTERECTOMY     EYE SURGERY Bilateral    cataract   FINGER ARTHROPLASTY Right 03/23/2020   Procedure: Right index finger metacarpal phalangeal arthroplasty with repair as necessary and centralization of the extensor tendon and right middle finger proximal interphalangeal arthroplasty with repair as necessary;  Surgeon: Camella Fallow, MD;  Location:  SURGERY CENTER;  Service: Orthopedics;  Laterality: Right;   FOOT SURGERY Left    HAND SURGERY  Left    ORIF SHOULDER FRACTURE Left    TONSILLECTOMY      Family History  Problem Relation Age of Onset   Rheumatic fever Mother    Social History:  reports that she has never smoked. She has never used smokeless tobacco. She reports current alcohol use of about 5.0 standard drinks of alcohol per week. She reports that she does not use drugs.  Allergies:  Allergies  Allergen Reactions   Penicillins Swelling    reddness   Sulfa Antibiotics     Medications Prior to Admission  Medication Sig Dispense Refill   Ascorbic Acid (VITAMIN C GUMMIES PO) Take 2 each by mouth daily.     cetirizine (ZYRTEC) 10 MG tablet Take 10 mg by mouth daily as needed for allergies.     Cholecalciferol  (VITAMIN D3 PO) Take 1 tablet by mouth daily.     [employer] FIBER GUMMY BEARS CHILDREN PO Take 2 each by mouth daily as needed (constipation).     diclofenac  Sodium (VOLTAREN  ARTHRITIS PAIN) 1 % GEL Apply 2 g topically daily as needed (pain).     folic acid  (FOLVITE ) 1 MG tablet Take 1 mg by mouth daily.     glucosamine-chondroitin 500-400 MG tablet Take 1 tablet by mouth daily.     naproxen  (NAPROSYN ) 500 MG tablet Take 500 mg by mouth daily.     Omega-3 Fatty  Acids (FISH OIL BURP-LESS) 1000 MG CAPS Take 1,000 mg by mouth daily.     sulfaSALAzine  (AZULFIDINE ) 500 MG tablet Take 1,500 mg by mouth daily.     Turmeric 500 MG CAPS Take 500 mg by mouth daily.      No results found for this or any previous visit (from the past 48 hours). No results found.  Review of Systems  Musculoskeletal:  Positive for arthralgias.  All other systems reviewed and are negative.   Blood pressure (!) 141/74, pulse 82, temperature (!) 97.5 F (36.4 C), temperature source Oral, resp. rate 18, height 5' 3 (1.6 m), weight 59 kg, SpO2 96%. Physical Exam Vitals reviewed.  HENT:     Head: Normocephalic.     Nose: Nose normal.     Mouth/Throat:     Mouth: Mucous membranes are moist.  Eyes:     Pupils: Pupils are equal, round,  and reactive to light.  Cardiovascular:     Rate and Rhythm: Normal rate.     Pulses: Normal pulses.  Pulmonary:     Effort: Pulmonary effort is normal.  Abdominal:     General: Abdomen is flat.  Musculoskeletal:     Cervical back: Normal range of motion.  Skin:    General: Skin is warm.     Capillary Refill: Capillary refill takes less than 2 seconds.  Neurological:     General: No focal deficit present.     Mental Status: She is alert.  Psychiatric:        Mood and Affect: Mood normal.    Ortho exam demonstrates range of motion on the left at 25/90/150. On the right range of motion passively is 40/90/160. Has pretty good subscap strength but slightly weaker posterior superior rotator cuff strength testing at 5 months out of 5. Does have coarseness and grinding on the right with passive range of motion. She does have active forward flexion and abduction above 90 degrees but slightly weaker on the right compared to the left.   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 Plan a CT scan right shoulder for patient specific instrumentation. Particularly important in this case where there is likely some glenoid deformity. The risk and benefits of reverse shoulder replacement discussed with the patient include not limited to infection nerve vessel damage incomplete pain relief as well as incomplete restoration of function. Patient understands and wishes to proceed. Pain is the biggest problem for Morrison at this time. All questions answered.   KANDICE Glendia Hutchinson, MD 05/10/2023, 10:33 AM

## 2023-05-10 NOTE — Anesthesia Procedure Notes (Addendum)
 Anesthesia Regional Block: Interscalene brachial plexus block   Pre-Anesthetic Checklist: , timeout performed,  Correct Patient, Correct Site, Correct Laterality,  Correct Procedure, Correct Position, site marked,  Risks and benefits discussed,  Surgical consent,  Pre-op evaluation,  At surgeon's request and post-op pain management  Laterality: Right  Prep: chloraprep       Needles:  Injection technique: Single-shot  Needle Type: Echogenic Stimulator Needle     Needle Length: 5cm  Needle Gauge: 22     Additional Needles:   Procedures:,,,, ultrasound used (permanent image in chart),,    Narrative:  Start time: 05/10/2023 11:05 AM End time: 05/10/2023 11:15 AM Injection made incrementally with aspirations every 5 mL.  Performed by: Personally  Anesthesiologist: Patrisha Bernardino SQUIBB, MD  Additional Notes: Functioning IV was confirmed and monitors were applied.  A timeout was performed. Sterile prep, hand hygiene and sterile gloves were used. A 50mm 22ga Bbraun echogenic stimulator needle was used. Negative aspiration and negative test dose prior to incremental administration of local anesthetic. The patient tolerated the procedure well.  Ultrasound guidance: relevent anatomy identified, needle position confirmed, local anesthetic spread visualized around nerve(s), vascular puncture avoided.  Image printed for medical record.

## 2023-05-10 NOTE — Op Note (Signed)
 [name], Bailey Petersen MEDICAL RECORD NO: [id number] ACCOUNT NO: [id number] DATE OF BIRTH: 1940-11-18 FACILITY: [facility] LOCATION: [facility] PHYSICIAN: Bailey Petersen. 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  PROCEDURE:  Right reverse shoulder replacement.  SURGEON:  Bailey Petersen. Addie, MD  ASSISTANT:  Bailey Magnet, PA  INDICATIONS:  The patient is an 83 year old patient with end-stage shoulder arthritis who presents for operative management after explanation of risks and benefits.  Implants utilized include [employer] Comprehensive Reverse shoulder system, medium augmented baseplate with 1 central compression screw, 4 peripheral locking screws and size 12 mini-humeral stem with 36+3 offset glenosphere and 36 humeral bearing standard  with +6 tapered offset tray.  DESCRIPTION OF PROCEDURE:  The patient was brought to the operating room where general anesthetic was induced. Preoperative antibiotic administered. Timeout was called. Right shoulder, arm and hand, prescrubbed with hydrogen peroxide followed by alcohol  and then Betadine  and then was prepped with ChloraPrep solution and draped in a sterile manner. Ioban was used to seal and cover the operative field. The head was in neutral position. After calling timeout, deltopectoral approach was made. Skin and  subcutaneous tissue was sharply divided. The cephalic vein was ligated because of crossing branches. The deltoid anterior attachment was elevated manually. Biceps tendon was then tenodesed to the pec tendon, which had been released about 1 cm. This was  done with 5-0 Vicryl sutures. Next, the axillary nerve was palpated and protected at all times during the case. Subdeltoid and subacromial adhesions were released. Part of the CA ligament was also released. Next, the  subscapularis was detached using a  #15 blade from the lesser tuberosity and that detachment was completed distally after tying off the circumflex vessels. Cautery was used to elevate the capsule off of the humeral neck about 2 cm underneath the humeral head using a Cobb elevator around to  the 5 o'clock position. Head was dislocated. Subscap was fully released back to the base of the coracoid. Next, reaming was performed up to a size 12. The head was then cut in 30 degrees of retroversion and broaching performed up to size 12. Cap was  placed. Posterior and anterior retractors were placed. Labral removal was performed circumferentially. Care was taken to avoid injury to the axillary nerve. The patient's specific guide was placed in the appropriate position. Reaming was performed in  accordance with preoperative templating. Reaming for the augmented baseplate was then performed and we obtained a very nice fit of the baseplate onto the glenoid. One central compression screw and four peripheral locking screws were placed. It should be  noted that we used Irrisept solution after the incision as well as after the arthrotomy during the case. At this time, trial reduction was performed with a +3 glenosphere and we could not get a +6 glenosphere in, so we used the +3 glenosphere. Very  stable construct with the +6 tapered tray and the standard liner. At this time, trial components were removed on the humeral side. Six suture tapes were placed through the lesser tuberosity for subscapularis reattachment. Irrisept solution was placed  into the canal. The +3 true glenosphere was placed on a dried Morse taper on the base plate. The true stem was placed and the same stability parameters were maintained with a similar construct. The patient's shoulder was stable with extension and  adduction as well  as the forward force as well as with internal and external rotation and 90 degrees of abduction. The true stem was placed  along with the true humeral baseplate. Same stability parameters were maintained. Axillary nerve palpated and  found to be intact. Thorough irrigation with 3 liters of pouring irrigation was then placed. The subscapularis was then reattached using the free needles and NICE knots x6. Very stable construct and repair of the subscapularis was achieved with  subsequent external rotation of the arm to about 45 degrees. Next, Irrisept solution was utilized on the implant and then we placed some vancomycin  powder on the implant. The deltopectoral incision was then closed using #1 Vicryl suture followed by  interrupted inverted 0 Vicryl suture, 2-0 Vicryl suture, and 3-0 Monocryl with Steri-Strips, Aquacel dressing, and a shoulder immobilizer placed. The patient tolerated the procedure well without immediate complications and transferred to the recovery  room in stable condition.  Bailey Petersen's assistance was required at all times for retraction, opening and closing, mobilization of tissue. His assistance was of medical necessity.   SHY D: 05/10/2023 5:09:06 pm T: 05/10/2023 7:14:00 pm  JOB: [id number]/ [id number]

## 2023-05-11 ENCOUNTER — Encounter (HOSPITAL_COMMUNITY): Payer: Self-pay | Admitting: Orthopedic Surgery

## 2023-05-11 DIAGNOSIS — M19011 Primary osteoarthritis, right shoulder: Secondary | ICD-10-CM | POA: Diagnosis not present

## 2023-05-11 MED ORDER — BUPIVACAINE LIPOSOME 1.3 % IJ SUSP
INTRAMUSCULAR | Status: DC | PRN
  Administered 2023-05-10: 10 mL via PERINEURAL

## 2023-05-11 MED ORDER — METHOCARBAMOL 500 MG PO TABS: 500.0000 mg | ORAL_TABLET | Freq: Four times a day (QID) | ORAL | 0 refills | Status: DC | PRN

## 2023-05-11 MED ORDER — DOCUSATE SODIUM 100 MG PO CAPS: 100.0000 mg | ORAL_CAPSULE | Freq: Two times a day (BID) | ORAL | 0 refills | Status: DC

## 2023-05-11 MED ORDER — ACETAMINOPHEN 500 MG PO TABS: 500.0000 mg | ORAL_TABLET | Freq: Four times a day (QID) | ORAL | 0 refills | Status: DC

## 2023-05-11 MED ORDER — HYDROCODONE-ACETAMINOPHEN 5-325 MG PO TABS: 1.0000 | ORAL_TABLET | ORAL | 0 refills | Status: DC | PRN

## 2023-05-11 MED ORDER — ONDANSETRON HCL 4 MG PO TABS: 4.0000 mg | ORAL_TABLET | Freq: Four times a day (QID) | ORAL | 0 refills | Status: DC | PRN

## 2023-05-11 MED ORDER — BUPIVACAINE HCL (PF) 0.5 % IJ SOLN
INTRAMUSCULAR | Status: DC | PRN
  Administered 2023-05-10: 14 mL via PERINEURAL

## 2023-05-11 MED ORDER — ASPIRIN 81 MG PO TBEC: 81.0000 mg | DELAYED_RELEASE_TABLET | Freq: Every day | ORAL | 12 refills | Status: DC

## 2023-05-11 NOTE — Progress Notes (Signed)
Patient alert and oriented, mae's well, voiding adequate amount of urine, swallowing without difficulty, no c/o pain at time of discharge. Patient discharged home with family. Script and discharged instructions given to patient. Patient and family stated understanding of instructions given. Patient has an appointment with Dr. Dean 

## 2023-05-11 NOTE — Plan of Care (Signed)
  Problem: Education: Goal: Knowledge of General Education information will improve Description: Including pain rating scale, medication(s)/side effects and non-pharmacologic comfort measures Outcome: Completed/Met   Problem: Health Behavior/Discharge Planning: Goal: Ability to manage health-related needs will improve Outcome: Completed/Met   Problem: Clinical Measurements: Goal: Ability to maintain clinical measurements within normal limits will improve Outcome: Completed/Met Goal: Will remain free from infection Outcome: Completed/Met Goal: Diagnostic test results will improve Outcome: Completed/Met Goal: Respiratory complications will improve Outcome: Completed/Met Goal: Cardiovascular complication will be avoided Outcome: Completed/Met   Problem: Activity: Goal: Risk for activity intolerance will decrease Outcome: Completed/Met   Problem: Nutrition: Goal: Adequate nutrition will be maintained Outcome: Completed/Met   Problem: Coping: Goal: Level of anxiety will decrease Outcome: Completed/Met   Problem: Elimination: Goal: Will not experience complications related to bowel motility Outcome: Completed/Met Goal: Will not experience complications related to urinary retention Outcome: Completed/Met   Problem: Pain Management: Goal: General experience of comfort will improve Outcome: Completed/Met   Problem: Safety: Goal: Ability to remain free from injury will improve Outcome: Completed/Met   Problem: Skin Integrity: Goal: Risk for impaired skin integrity will decrease Outcome: Completed/Met   Problem: Education: Goal: Knowledge of the prescribed therapeutic regimen will improve Outcome: Completed/Met Goal: Understanding of activity limitations/precautions following surgery will improve Outcome: Completed/Met Goal: Individualized Educational Video(s) Outcome: Completed/Met   Problem: Activity: Goal: Ability to tolerate increased activity will  improve Outcome: Completed/Met   Problem: Pain Management: Goal: Pain level will decrease with appropriate interventions Outcome: Completed/Met

## 2023-05-11 NOTE — Evaluation (Signed)
 Physical Therapy Brief Evaluation and Discharge Note Patient Details Name: Bailey Petersen MRN: [id number] DOB: 06-Feb-1941 Today's Date: 05/11/2023   History of Present Illness  83 yo female presenting 1/9 s/p TSA.  PMH includes: Arthritis, RA, GERD, IBS  Clinical Impression  The pt presents sitting EOB, able to complete transfers without assistance or evidence of instability. She lives with her spouse and has additional family available to assist as needed. Session with focus on balance and stair navigation as the pt has a flight of stairs in her home, she was able to complete hallway ambulation and 10 stairs with supervision and use of L rail. Pt and her son-in-law were educated on ascending forwards, descending sideways so LUE can hold rail. Both verbalized agreement. Further education provided regarding progressive activity recommendations, pt able to recall education from OT without issue. The pt will be safe to return home with family, no further acute PT needs or new DME recommended at this time. Thank you for the consult.      PT Assessment Patient does not need any further PT services  Assistance Needed at Discharge  PRN    Equipment Recommendations None recommended by PT  Recommendations for Other Services       Precautions/Restrictions Precautions Precautions: Shoulder Type of Shoulder Precautions: No AROM to L shoulder Shoulder Interventions: Shoulder sling/immobilizer Precaution Booklet Issued: Yes (comment) Precaution Comments: No pushing, no pulling, no lifting Restrictions Weight Bearing Restrictions Per Provider Order: Yes RUE Weight Bearing Per Provider Order: Non weight bearing        Mobility  Bed Mobility Rolling: Independent Supine/Sidelying to sit: Independent Sit to supine/sidelying: Independent General bed mobility comments: pt sitting EOB upon arrival  Transfers Overall transfer level: Modified independent Equipment used: None                General transfer comment: supervision in session, but no evidence of instability or LOB    Ambulation/Gait Ambulation/Gait assistance: Supervision Gait Distance (Feet): 150 Feet Assistive device: None Gait Pattern/deviations: WFL(Within Functional Limits) Gait Speed: Pace WFL    Home Activity Instructions Home Activity Instructions: progressive walking program  Stairs Stairs: Yes Stairs assistance: Contact guard assist Stair Management: One rail Left, Alternating pattern, Forwards, Sideways Number of Stairs: 10 General stair comments: ascending forwards, educated on descending sideways so LUE can hold rail. no overt LOB or buckling  Modified Rankin (Stroke Patients Only)        Balance Overall balance assessment: Mild deficits observed, not formally tested Sitting-balance support: No upper extremity supported, Feet supported Sitting balance-Leahy Scale: Good     Standing balance support: No upper extremity supported, During functional activity Standing balance-Leahy Scale: Good            Pertinent Vitals/Pain PT - Brief Vital Signs All Vital Signs Stable: Yes Pain Assessment Pain Assessment: 0-10 Pain Score: 2  Faces Pain Scale: Hurts a little bit Pain Location: L shoulder with block in place Pain Descriptors / Indicators: Aching Pain Intervention(s): Limited activity within patient's tolerance, Monitored during session, Repositioned     Home Living Family/patient expects to be discharged to:: Private residence Living Arrangements: Spouse/significant other Available Help at Discharge: Family;Available 24 hours/day Home Environment: Stairs to enter;Stairs in home;Rail - left  Stairs-Number of Steps: 2 to enter, flight to bedroom Home Equipment: [employer] (2 wheels);Shower seat        Prior Function Level of Independence: Independent      UE/LE Assessment   UE ROM/Strength/Tone/Coordination: WFL  LE ROM/Strength/Tone/Coordination: [facility]       Communication   Communication Communication: No apparent difficulties Cueing Techniques: Verbal cues     Cognition Overall Cognitive Status: Appears within functional limits for tasks assessed/performed        PT Visit Diagnosis Unsteadiness on feet (R26.81)    No Skilled PT All education completed;Patient will have necessary level of assist by caregiver at discharge;Patient is supervision for all activity/mobility    AMPAC 6 Clicks Help needed turning from your back to your side while in a flat bed without using bedrails?: None Help needed moving from lying on your back to sitting on the side of a flat bed without using bedrails?: None Help needed moving to and from a bed to a chair (including a wheelchair)?: None Help needed standing up from a chair using your arms (e.g., wheelchair or bedside chair)?: None Help needed to walk in hospital room?: A Little Help needed climbing 3-5 steps with a railing? : A Little 6 Click Score: 22      End of Session Equipment Utilized During Treatment: Gait belt Activity Tolerance: Patient tolerated treatment well Patient left: in bed;with Petersen bell/phone within reach;with family/visitor present Nurse Communication: Mobility status PT Visit Diagnosis: Unsteadiness on feet (R26.81)     Time: 9097-9088 PT Time Calculation (min) (ACUTE ONLY): 9 min  Charges:   PT Evaluation $PT Eval Low Complexity: 1 Low      Bailey Petersen, PT, DPT   Acute Rehabilitation Department Office [phone] Secure Chat Communication Preferred  Bailey JULIANNA Petersen  05/11/2023, 10:04 AM

## 2023-05-11 NOTE — Progress Notes (Signed)
  Subjective: Patient stable.  Pain controlled.  Block now is just starting to wear off,  hand has motion and some early return of sensation   Objective: Vital signs in last 24 hours: Temp:  [97.1 F (36.2 C)-98 F (36.7 C)] 97.9 F (36.6 C) (01/10 0254) Pulse Rate:  [72-88] 73 (01/10 0254) Resp:  [15-21] 18 (01/10 0254) BP: (112-141)/(59-86) 113/61 (01/10 0254) SpO2:  [92 %-100 %] 93 % (01/10 0254) Weight:  [59 kg] 59 kg (01/09 0923)  Intake/Output from previous day: 01/09 0701 - 01/10 0700 In: 1630 [P.O.:480; I.V.:700; IV Piggyback:450] Out: 75 [Blood:75] Intake/Output this shift: No intake/output data recorded.  Exam:  Dressing intact.  Hand has motion.  Radial pulse intact.  No cellulitis or erythema  Labs: No results for input(s): HGB in the last 72 hours. No results for input(s): WBC, RBC, HCT, PLT in the last 72 hours. No results for input(s): NA, K, CL, CO2, BUN, CREATININE, GLUCOSE, CALCIUM in the last 72 hours. No results for input(s): LABPT, INR in the last 72 hours.  Assessment/Plan: Plan at this time is occupational therapy this morning.  Patient will use CPM machine 1 hour 3 times a day starting later tonight.  Follow-up with us  in 2 weeks.   G Scott Timberlyn Pickford 05/11/2023, 7:23 AM

## 2023-05-11 NOTE — Evaluation (Signed)
 Occupational Therapy Evaluation Patient Details Name: Bailey Petersen MRN: [id number] DOB: [birth year] Today's Date: 05/11/2023   History of Present Illness 83 yo F s/p TSA.  PMH includes: Arthritis, RA, GERD, IBS   Clinical Impression   Patient admitted for the procedure above.  PTA she lives at home with her spouse, and needed no assist with any aspect of ADL,iADL or mobility.  Patient is needing expected assist with ADL post surgery with RUE block in place.  Patient with good understanding of all precautions, HEP and compensatory strategies for ADL.  No further needs in the acute setting, recommend follow up as prescribed by MD.        If plan is discharge home, recommend the following: Assist for transportation    Functional Status Assessment  Patient has had a recent decline in their functional status and demonstrates the ability to make significant improvements in function in a reasonable and predictable amount of time.  Equipment Recommendations  None recommended by OT    Recommendations for Other Services       Precautions / Restrictions Precautions Precautions: Shoulder Type of Shoulder Precautions: No AROM to L shoulder Shoulder Interventions: Shoulder sling/immobilizer Precaution Booklet Issued: Yes (comment) Precaution Comments: No pushing, no pulling, no lifting Restrictions Weight Bearing Restrictions Per Provider Order: Yes RUE Weight Bearing Per Provider Order: Non weight bearing      Mobility Bed Mobility Overal bed mobility: Modified Independent                  Transfers Overall transfer level: Modified independent Equipment used: None                      Balance Overall balance assessment: No apparent balance deficits (not formally assessed)                                         ADL either performed or assessed with clinical judgement   ADL                   Upper Body Dressing : Moderate assistance    Lower Body Dressing: Minimal assistance   Toilet Transfer: Modified Independent                   Vision Patient Visual Report: No change from baseline       Perception Perception: Within Functional Limits       Praxis Praxis: WFL       Pertinent Vitals/Pain Pain Assessment Pain Assessment: Faces Faces Pain Scale: Hurts a little bit Pain Location: L shoulder with block in place Pain Descriptors / Indicators: Aching Pain Intervention(s): Monitored during session     Extremity/Trunk Assessment Upper Extremity Assessment Upper Extremity Assessment: Overall WFL for tasks assessed   Lower Extremity Assessment Lower Extremity Assessment: Overall WFL for tasks assessed   Cervical / Trunk Assessment Cervical / Trunk Assessment: Normal   Communication Communication Communication: No apparent difficulties   Cognition Arousal: Alert Behavior During Therapy: WFL for tasks assessed/performed Overall Cognitive Status: Within Functional Limits for tasks assessed                                       General Comments   vSS on RA    Exercises Shoulder Exercises Pendulum Exercise:  PROM Elbow Flexion: AAROM Elbow Extension: AAROM Wrist Flexion: AAROM Wrist Extension: AAROM Digit Composite Flexion: AAROM Composite Extension: AAROM   Shoulder Instructions      Home Living Family/patient expects to be discharged to:: Private residence Living Arrangements: Spouse/significant other Available Help at Discharge: Family;Available 24 hours/day Type of Home: House Home Access: Stairs to enter [employer] of Steps: 1-2   Home Layout: Two level Alternate Level Stairs-Number of Steps: 14 Alternate Level Stairs-Rails: Left Bathroom Shower/Tub: Tub/shower unit;Walk-in shower   Bathroom Toilet: Standard Bathroom Accessibility: Yes How Accessible: Accessible via walker Home Equipment: Rolling Walker (2 wheels);Shower seat          Prior  Functioning/Environment Prior Level of Function : Independent/Modified Independent;Driving                        OT Problem List: Pain      OT Treatment/Interventions:      OT Goals(Current goals can be found in the care plan section) Acute Rehab OT Goals Patient Stated Goal: Return home OT Goal Formulation: With patient Time For Goal Achievement: 05/14/23 Potential to Achieve Goals: Good  OT Frequency:      Co-evaluation              AM-PAC OT 6 Clicks Daily Activity     Outcome Measure Help from another person eating meals?: None Help from another person taking care of personal grooming?: A Little Help from another person toileting, which includes using toliet, bedpan, or urinal?: A Little Help from another person bathing (including washing, rinsing, drying)?: A Lot Help from another person to put on and taking off regular upper body clothing?: A Lot Help from another person to put on and taking off regular lower body clothing?: A Little 6 Click Score: 17   End of Session Nurse Communication: Mobility status  Activity Tolerance: Patient tolerated treatment well Patient left: in chair;with call bell/phone within reach  OT Visit Diagnosis: Pain Pain - Right/Left: Right Pain - part of body: Shoulder                Time: 9184-9142 OT Time Calculation (min): 42 min Charges:  OT General Charges $OT Visit: 1 Visit OT Evaluation $OT Eval Moderate Complexity: 1 Mod OT Treatments $Self Care/Home Management : 8-22 mins $Therapeutic Exercise: 8-22 mins  05/11/2023  RP, OTR/L  Acute Rehabilitation Services  Office:  [phone]   Bailey Petersen 05/11/2023, 9:04 AM

## 2023-05-11 NOTE — Anesthesia Postprocedure Evaluation (Signed)
 Anesthesia Post Note  Patient: Bailey Petersen  Procedure(s) Performed: REVERSE SHOULDER ARTHROPLASTY (Right: Shoulder) BICEPS TENODESIS (Right: Shoulder)     Patient location during evaluation: PACU Anesthesia Type: Regional and General Level of consciousness: awake Pain management: pain level controlled Vital Signs Assessment: post-procedure vital signs reviewed and stable Respiratory status: spontaneous breathing, nonlabored ventilation and respiratory function stable Cardiovascular status: blood pressure returned to baseline and stable Postop Assessment: no apparent nausea or vomiting Anesthetic complications: no   No notable events documented.  Last Vitals:  Vitals:   05/10/23 2316 05/11/23 0254  BP: 116/64 113/61  Pulse: 83 73  Resp: 18 18  Temp: 36.7 C 36.6 C  SpO2: 93% 93%    Last Pain:  Vitals:   05/11/23 0254  TempSrc: Oral  PainSc:                  Bernardino SQUIBB Chun Sellen

## 2023-05-14 ENCOUNTER — Telehealth: Payer: Self-pay

## 2023-05-14 DIAGNOSIS — M19011 Primary osteoarthritis, right shoulder: Secondary | ICD-10-CM

## 2023-05-14 NOTE — Telephone Encounter (Signed)
 Okay to take benadryl. May be a side effect from pain medication

## 2023-05-14 NOTE — Telephone Encounter (Signed)
 Patient called stating that she has rash and has been itching all over her body for a couple of hours today.  Wanted to know if she could take a allergy pill (Benadryl )?  Patient had right shoulder surgery on 05/10/2023.  CB# [phone].  Please advise.  Thank you.

## 2023-05-15 ENCOUNTER — Encounter: Payer: Self-pay | Admitting: Orthopedic Surgery

## 2023-05-15 ENCOUNTER — Other Ambulatory Visit: Payer: Self-pay | Admitting: Orthopedic Surgery

## 2023-05-15 ENCOUNTER — Telehealth: Payer: Self-pay | Admitting: Orthopedic Surgery

## 2023-05-15 MED ORDER — TRAMADOL HCL 50 MG PO TABS: 50.0000 mg | ORAL_TABLET | Freq: Four times a day (QID) | ORAL | 0 refills | Status: DC | PRN

## 2023-05-15 NOTE — Telephone Encounter (Signed)
 Tramadol sent thx

## 2023-05-15 NOTE — Telephone Encounter (Signed)
 Pls advise.

## 2023-05-15 NOTE — Telephone Encounter (Signed)
 Pt called stating she had a severe allergic reaction. Pt states she believe she reaction to hydrocodone. Pt need another pain medication. Please call pt ASAP at [phone].

## 2023-05-15 NOTE — Telephone Encounter (Signed)
 Mychart message sent to patient.

## 2023-05-17 NOTE — Discharge Summary (Signed)
Physician Discharge Summary      Patient ID: Bailey Petersen MRN: [id number] DOB/AGE: [birth year] 83 y.o.  Admit date: 05/10/2023 Discharge date: 05/11/2023  Admission Diagnoses:  Principal Problem:   S/P reverse total shoulder arthroplasty, right Active Problems:   Arthritis of right shoulder region   Discharge Diagnoses:  Same  Surgeries: Procedure(s): REVERSE SHOULDER ARTHROPLASTY BICEPS TENODESIS on 05/10/2023   Consultants:   Discharged Condition: Stable  Hospital Course: Bailey Petersen is an 83 y.o. female who was admitted 05/10/2023 with a chief complaint of right shoulder pain, and found to have a diagnosis of right shoulder arthritis.  They were brought to the operating room on 05/10/2023 and underwent the above named procedures.  Pt awoke from anesthesia without complication and was transferred to the floor. On POD1, patient's pain was controlled.  Block still in effect.  She was able to mobilize around the unit without difficulty and work with occupational therapy.  She was discharged home on POD 1.  No red flag signs or symptoms throughout her stay..  Pt will f/u with Dr. August Saucer in clinic in ~2 weeks.   Antibiotics given:  Anti-infectives (From admission, onward)    Start     Dose/Rate Route Frequency Ordered Stop   05/10/23 2000  ceFAZolin (ANCEF) IVPB 2g/100 mL premix  Status:  Discontinued        2 g 200 mL/hr over 30 Minutes Intravenous Every 8 hours 05/10/23 1602 05/11/23 1509   05/10/23 1054  vancomycin (VANCOCIN) powder  Status:  Discontinued          As needed 05/10/23 1054 05/10/23 1435   05/10/23 0945  ceFAZolin (ANCEF) IVPB 2g/100 mL premix        2 g 200 mL/hr over 30 Minutes Intravenous On call to O.R. 05/10/23 3244 05/10/23 1155     .  Recent vital signs:  Vitals:   05/11/23 0254 05/11/23 0725  BP: 113/61 112/68  Pulse: 73 80  Resp: 18 19  Temp: 97.9 F (36.6 C) 98.3 F (36.8 C)  SpO2: 93% 92%    Recent laboratory studies:  Results for orders  placed or performed during the hospital encounter of 05/01/23  CBC   Collection Time: 05/01/23 10:43 AM  Result Value Ref Range   WBC 5.9 4.0 - 10.5 K/uL   RBC 4.05 3.87 - 5.11 MIL/uL   Hemoglobin 12.1 12.0 - 15.0 g/dL   HCT 01.0 27.2 - 53.6 %   MCV 95.8 80.0 - 100.0 fL   MCH 29.9 26.0 - 34.0 pg   MCHC 31.2 30.0 - 36.0 g/dL   RDW 64.4 03.4 - 74.2 %   Platelets 263 150 - 400 K/uL   nRBC 0.0 0.0 - 0.2 %  Basic metabolic panel   Collection Time: 05/01/23 10:43 AM  Result Value Ref Range   Sodium 140 135 - 145 mmol/L   Potassium 4.6 3.5 - 5.1 mmol/L   Chloride 103 98 - 111 mmol/L   CO2 27 22 - 32 mmol/L   Glucose, Bld 83 70 - 99 mg/dL   BUN 22 8 - 23 mg/dL   Creatinine, Ser 5.95 0.44 - 1.00 mg/dL   Calcium 63.8 8.9 - 75.6 mg/dL   GFR, Estimated >43 >32 mL/min   Anion gap 10 5 - 15  Urinalysis, w/ Reflex to Culture (Infection Suspected) -Urine, Clean Catch   Collection Time: 05/01/23 10:43 AM  Result Value Ref Range   Specimen Source URINE, CLEAN CATCH    Color,  Urine AMBER (A) YELLOW   APPearance HAZY (A) CLEAR   Specific Gravity, Urine 1.018 1.005 - 1.030   pH 5.0 5.0 - 8.0   Glucose, UA NEGATIVE NEGATIVE mg/dL   Hgb urine dipstick NEGATIVE NEGATIVE   Bilirubin Urine NEGATIVE NEGATIVE   Ketones, ur NEGATIVE NEGATIVE mg/dL   Protein, ur NEGATIVE NEGATIVE mg/dL   Nitrite NEGATIVE NEGATIVE   Leukocytes,Ua TRACE (A) NEGATIVE   RBC / HPF 0-5 0 - 5 RBC/hpf   WBC, UA 0-5 0 - 5 WBC/hpf   Bacteria, UA NONE SEEN NONE SEEN   Squamous Epithelial / HPF 0-5 0 - 5 /HPF   Mucus PRESENT   Surgical pcr screen   Collection Time: 05/01/23 11:52 AM   Specimen: Nasal Mucosa; Nasal Swab  Result Value Ref Range   MRSA, PCR NEGATIVE NEGATIVE   Staphylococcus aureus NEGATIVE NEGATIVE    Discharge Medications:   Allergies as of 05/11/2023       Reactions   Penicillins Swelling   reddness   Sulfa Antibiotics         Medication List     PAUSE taking these medications     sulfaSALAzine 500 MG tablet Wait to take this until: May 18, 2023 Commonly known as: AZULFIDINE Take 1,500 mg by mouth daily.       STOP taking these medications    [employer] FIBER GUMMY BEARS CHILDREN PO   Fish Oil Burp-Less 1000 MG Caps       TAKE these medications    acetaminophen 500 MG tablet Commonly known as: TYLENOL Take 1 tablet (500 mg total) by mouth every 6 (six) hours.   aspirin EC 81 MG tablet Take 1 tablet (81 mg total) by mouth daily. Swallow whole.   cetirizine 10 MG tablet Commonly known as: ZYRTEC Take 10 mg by mouth daily as needed for allergies.   docusate sodium 100 MG capsule Commonly known as: COLACE Take 1 capsule (100 mg total) by mouth 2 (two) times daily.   folic acid 1 MG tablet Commonly known as: FOLVITE Take 1 mg by mouth daily.   glucosamine-chondroitin 500-400 MG tablet Take 1 tablet by mouth daily.   HYDROcodone-acetaminophen 5-325 MG tablet Commonly known as: NORCO/VICODIN Take 1-2 tablets by mouth every 4 (four) hours as needed for moderate pain (pain score 4-6) or severe pain (pain score 7-10).   methocarbamol 500 MG tablet Commonly known as: ROBAXIN Take 1 tablet (500 mg total) by mouth every 6 (six) hours as needed for muscle spasms.   naproxen 500 MG tablet Commonly known as: NAPROSYN Take 500 mg by mouth daily.   ondansetron 4 MG tablet Commonly known as: ZOFRAN Take 1 tablet (4 mg total) by mouth every 6 (six) hours as needed for nausea.   Turmeric 500 MG Caps Take 500 mg by mouth daily.   VITAMIN C GUMMIES PO Take 2 each by mouth daily.   VITAMIN D3 PO Take 1 tablet by mouth daily.   Voltaren Arthritis Pain 1 % Gel Generic drug: diclofenac Sodium Apply 2 g topically daily as needed (pain).        Diagnostic Studies: DG Shoulder Right Port Result Date: 05/10/2023 CLINICAL DATA:  Status post shoulder surgery. EXAM: RIGHT SHOULDER - 1 VIEW COMPARISON:  None Available. FINDINGS: Reverse right shoulder  arthroplasty in expected alignment. No periprosthetic lucency or fracture. Recent postsurgical change includes air and edema in the joint space and soft tissues. IMPRESSION: Reverse shoulder arthroplasty without immediate postoperative complication. Electronically Signed  By: Narda Rutherford M.D.   On: 05/10/2023 16:53    Disposition: Discharge disposition: 01-Home or Self Care       Discharge Instructions     Call MD / Call 911   Complete by: As directed    If you experience chest pain or shortness of breath, CALL 911 and be transported to the hospital emergency room.  If you develope a fever above 101 F, pus (white drainage) or increased drainage or redness at the wound, or calf pain, call your surgeon's office.   Constipation Prevention   Complete by: As directed    Drink plenty of fluids.  Prune juice may be helpful.  You may use a stool softener, such as Colace (over the counter) 100 mg twice a day.  Use MiraLax (over the counter) for constipation as needed.   Diet - low sodium heart healthy   Complete by: As directed    Discharge instructions   Complete by: As directed    From Dr. August Saucer 1.  Use CPM machine 1 hour 3 times a day and then go back in the sling 2.  Okay to shower.  Dressing is waterproof 3.  Increase the degrees of CPM daily. 4.  No lifting with right arm   Increase activity slowly as tolerated   Complete by: As directed    Post-operative opioid taper instructions:   Complete by: As directed    POST-OPERATIVE OPIOID TAPER INSTRUCTIONS: It is important to wean off of your opioid medication as soon as possible. If you do not need pain medication after your surgery it is ok to stop day one. Opioids include: Codeine, Hydrocodone(Norco, Vicodin), Oxycodone(Percocet, oxycontin) and hydromorphone amongst others.  Long term and even short term use of opiods can cause: Increased pain response Dependence Constipation Depression Respiratory depression And more.   Withdrawal symptoms can include Flu like symptoms Nausea, vomiting And more Techniques to manage these symptoms Hydrate well Eat regular healthy meals Stay active Use relaxation techniques(deep breathing, meditating, yoga) Do Not substitute Alcohol to help with tapering If you have been on opioids for less than two weeks and do not have pain than it is ok to stop all together.  Plan to wean off of opioids This plan should start within one week post op of your joint replacement. Maintain the same interval or time between taking each dose and first decrease the dose.  Cut the total daily intake of opioids by one tablet each day Next start to increase the time between doses. The last dose that should be eliminated is the evening dose.             SignedKarenann Cai 05/17/2023, 10:32 AM

## 2023-05-25 ENCOUNTER — Ambulatory Visit (INDEPENDENT_AMBULATORY_CARE_PROVIDER_SITE_OTHER): Payer: Medicare Other | Admitting: Orthopedic Surgery

## 2023-05-25 ENCOUNTER — Other Ambulatory Visit (INDEPENDENT_AMBULATORY_CARE_PROVIDER_SITE_OTHER): Payer: Self-pay

## 2023-05-25 ENCOUNTER — Encounter: Payer: Self-pay | Admitting: Orthopedic Surgery

## 2023-05-25 DIAGNOSIS — Z96611 Presence of right artificial shoulder joint: Secondary | ICD-10-CM

## 2023-05-25 NOTE — Progress Notes (Signed)
   Post-Op Visit Note   Patient: Bailey Petersen           Date of Birth: 05-Jan-1941           MRN: [id number] Visit Date: 05/25/2023 PCP: Lorelei Pont, DO   Assessment & Plan:  Chief Complaint:  Chief Complaint  Patient presents with   Right Shoulder - Routine Post Op    05/10/23 RT RSA   Visit Diagnoses:  1. Status post reverse arthroplasty of right shoulder     Plan: Konni is an 83 year old patient is now 2 weeks out right reverse shoulder replacement.  Doing well.  On exam the incision is intact.  Deltoid fires.  She is near 90 degrees on her CPM machine.  Plan is to discontinue the 2 sutures at the proximal aspect of the incision.  [facility] physical therapy for passive and active assisted range of motion but no strengthening until 6 weeks postop.  2-week return with possible injection into the symptomatic left shoulder.  She will need radiographs at that time.  Taking Tylenol and naproxen for pain.  Follow-Up Instructions: No follow-ups on file.   Orders:  Orders Placed This Encounter  Procedures   XR Shoulder Right   No orders of the defined types were placed in this encounter.   Imaging: No results found.  PMFS History: Patient Active Problem List   Diagnosis Date Noted   Arthritis of right shoulder region 05/14/2023   S/P reverse total shoulder arthroplasty, right 05/10/2023   Past Medical History:  Diagnosis Date   Arthritis    RA   Complication of anesthesia    GERD (gastroesophageal reflux disease)    History of hiatal hernia    IBS (irritable bowel syndrome)    PONV (postoperative nausea and vomiting)    Rheumatoid arthritis (HCC)     Family History  Problem Relation Age of Onset   Rheumatic fever Mother     Past Surgical History:  Procedure Laterality Date   ABDOMINAL HYSTERECTOMY     BICEPT TENODESIS Right 05/10/2023   Procedure: BICEPS TENODESIS;  Surgeon: Cammy Copa, MD;  [facility];  Service: Orthopedics;  Laterality: Right;    EYE SURGERY Bilateral    cataract   FINGER ARTHROPLASTY Right 03/23/2020   Procedure: Right index finger metacarpal phalangeal arthroplasty with repair as necessary and centralization of the extensor tendon and right middle finger proximal interphalangeal arthroplasty with repair as necessary;  Surgeon: Dominica Severin, MD;  Location: [facility];  Service: Orthopedics;  Laterality: Right;   FOOT SURGERY Left    HAND SURGERY Left    ORIF SHOULDER FRACTURE Left    REVERSE SHOULDER ARTHROPLASTY Right 05/10/2023   Procedure: REVERSE SHOULDER ARTHROPLASTY;  Surgeon: Cammy Copa, MD;  Location: [facility] OR;  Service: Orthopedics;  Laterality: Right;   TONSILLECTOMY     Social History   Occupational History   Not on file  Tobacco Use   Smoking status: Never   Smokeless tobacco: Never  Vaping Use   Vaping status: Never Used  Substance and Sexual Activity   Alcohol use: Yes    Alcohol/week: 5.0 standard drinks of alcohol    Types: 5 Glasses of wine per week   Drug use: Never   Sexual activity: Not on file

## 2023-06-13 ENCOUNTER — Other Ambulatory Visit: Payer: Self-pay

## 2023-06-13 ENCOUNTER — Other Ambulatory Visit (INDEPENDENT_AMBULATORY_CARE_PROVIDER_SITE_OTHER): Payer: Medicare Other

## 2023-06-13 ENCOUNTER — Encounter: Payer: Self-pay | Admitting: Surgical

## 2023-06-13 ENCOUNTER — Ambulatory Visit (INDEPENDENT_AMBULATORY_CARE_PROVIDER_SITE_OTHER): Payer: Medicare Other | Admitting: Surgical

## 2023-06-13 DIAGNOSIS — M19012 Primary osteoarthritis, left shoulder: Secondary | ICD-10-CM

## 2023-06-13 MED ORDER — BUPIVACAINE HCL 0.25 % IJ SOLN
9.0000 mL | INTRAMUSCULAR | Status: AC | PRN
  Administered 2023-06-13: 9 mL via INTRA_ARTICULAR

## 2023-06-13 MED ORDER — LIDOCAINE HCL 1 % IJ SOLN
5.0000 mL | INTRAMUSCULAR | Status: AC | PRN
  Administered 2023-06-13: 5 mL

## 2023-06-13 MED ORDER — METHYLPREDNISOLONE ACETATE 40 MG/ML IJ SUSP
40.0000 mg | INTRAMUSCULAR | Status: AC | PRN
  Administered 2023-06-13: 40 mg via INTRA_ARTICULAR

## 2023-06-13 NOTE — Progress Notes (Signed)
Post-Op Visit Note   Patient: Bailey Petersen           Date of Birth: [birth year]           MRN: [id number] Visit Date: 06/13/2023 PCP: Lorelei Pont, DO   Assessment & Plan:  Chief Complaint:  Chief Complaint  Patient presents with   Right Shoulder - Routine Post Op    05/10/23 RT RSA   Visit Diagnoses:  1. Arthritis of left shoulder region     Plan: Bailey Petersen is a 83 y.o. female who presents s/p right reverse shoulder arthroplasty on 05/10/2023.  Patient is doing well and pain is overall controlled.  Using CPM machine as instructed and she is up to about 120 degrees..  Denies any chest pain, SOB, fevers, chills.  No complaint of any instability symptoms.  She started physical therapy last week and this has been going well.  She has another therapy session on Friday..    On exam, patient has range of motion 30 degrees X rotation, 75 degrees abduction, 120 degrees forward elevation passively.  Intact EPL, FPL, finger abduction, finger adduction, pronation/supination, bicep, tricep, deltoid of operative extremity.  Axillary nerve intact with deltoid firing.  Incision is healing well without evidence of infection or dehiscence.  Was radial pulse of the operative extremity.  Plan is discontinue sling.  Okay to lift up to 5 pounds with the operative arm.  Did with PT and follow-up for final check with Dr. August Saucer in 6 weeks.  Regarding the left shoulder, she has history of ORIF by Dr. Carola Frost about 10 years ago according to her.  Her left shoulder has been bothering her significantly over the last several months.  She denies any recent fall or injury.  She has pain especially at night that keeps her up and keeps her from sleeping.  No fevers or chills.  On exam, patient has 35 degrees X rotation, 80 degrees abduction, 150 degrees forward elevation passively and actively.  Intact rotator cuff strength of supra, infra, subscap rated 5/5.  Axillary nerve is intact with deltoid firing.  2+ radial  pulse of the left upper extremity.  No cellulitis or significant skin changes noted.  Well-healed incision.  Plan regarding the left shoulder is injection today into the glenohumeral joint.  Tolerated procedure well.  She will let Dr. August Saucer know how her shoulder is feeling at her next appointment.  If no significant improvement, she is interested in shoulder surgery on her left shoulder sometime around September.    Procedure Note  Patient: Bailey Petersen             Date of Birth: 15-Mar-1941           MRN: [id number]             Visit Date: 06/13/2023  Procedures: Visit Diagnoses:  1. Arthritis of left shoulder region     Large Joint Inj: L glenohumeral on 06/13/2023 10:46 AM Details: 22 G 3.5 in needle, ultrasound-guided posterior approach Medications: 5 mL lidocaine 1 %; 9 mL bupivacaine 0.25 %; 40 mg methylPREDNISolone acetate 40 MG/ML Outcome: tolerated well, no immediate complications Procedure, treatment alternatives, risks and benefits explained, specific risks discussed. Consent was given by the patient. Patient was prepped and draped in the usual sterile fashion.         Follow-Up Instructions: Return in about 6 weeks (around 07/25/2023).   Orders:  Orders Placed This Encounter  Procedures   XR Shoulder Left  US Guided Needle Placement - No Linked Charges   No orders of the defined types were placed in this encounter.   Imaging: XR Shoulder Left Result Date: 06/13/2023 AP, Scap Y, axillary views of left shoulder reviewed.  Mild joint space narrowing noted of the glenohumeral joint with inferior humeral head osteophyte and osteophytic lipping of the glenoid noted.  No fracture or dislocation.  There is hardware that is present from prior ORIF with no significant evidence of loosening of hardware.   PMFS History: Patient Active Problem List   Diagnosis Date Noted   Arthritis of right shoulder region 05/14/2023   S/P reverse total shoulder arthroplasty, right  05/10/2023   Past Medical History:  Diagnosis Date   Arthritis    RA   Complication of anesthesia    GERD (gastroesophageal reflux disease)    History of hiatal hernia    IBS (irritable bowel syndrome)    PONV (postoperative nausea and vomiting)    Rheumatoid arthritis (HCC)     Family History  Problem Relation Age of Onset   Rheumatic fever Mother     Past Surgical History:  Procedure Laterality Date   ABDOMINAL HYSTERECTOMY     BICEPT TENODESIS Right 05/10/2023   Procedure: BICEPS TENODESIS;  Surgeon: Cammy Copa, MD;  Location: [facility] OR;  Service: Orthopedics;  Laterality: Right;   EYE SURGERY Bilateral    cataract   FINGER ARTHROPLASTY Right 03/23/2020   Procedure: Right index finger metacarpal phalangeal arthroplasty with repair as necessary and centralization of the extensor tendon and right middle finger proximal interphalangeal arthroplasty with repair as necessary;  Surgeon: Dominica Severin, MD;  Location: [facility];  Service: Orthopedics;  Laterality: Right;   FOOT SURGERY Left    HAND SURGERY Left    ORIF SHOULDER FRACTURE Left    REVERSE SHOULDER ARTHROPLASTY Right 05/10/2023   Procedure: REVERSE SHOULDER ARTHROPLASTY;  Surgeon: Cammy Copa, MD;  Location: [facility] OR;  Service: Orthopedics;  Laterality: Right;   TONSILLECTOMY     Social History   Occupational History   Not on file  Tobacco Use   Smoking status: Never   Smokeless tobacco: Never  Vaping Use   Vaping status: Never Used  Substance and Sexual Activity   Alcohol use: Yes    Alcohol/week: 5.0 standard drinks of alcohol    Types: 5 Glasses of wine per week   Drug use: Never   Sexual activity: Not on file

## 2023-06-23 ENCOUNTER — Inpatient Hospital Stay (HOSPITAL_COMMUNITY): Admit: 2023-06-23 | Payer: Medicare Other | Admitting: Internal Medicine

## 2023-06-23 ENCOUNTER — Encounter (HOSPITAL_COMMUNITY): Payer: Self-pay

## 2023-06-24 ENCOUNTER — Other Ambulatory Visit: Payer: Self-pay

## 2023-06-24 ENCOUNTER — Encounter (HOSPITAL_COMMUNITY): Payer: Self-pay | Admitting: Orthopaedic Surgery

## 2023-06-24 ENCOUNTER — Inpatient Hospital Stay (HOSPITAL_COMMUNITY): Payer: Medicare Other

## 2023-06-24 ENCOUNTER — Inpatient Hospital Stay (HOSPITAL_COMMUNITY): Payer: Medicare Other | Admitting: Certified Registered"

## 2023-06-24 ENCOUNTER — Encounter (HOSPITAL_COMMUNITY)
Admission: RE | Disposition: A | Discharge status: Rehabilitation Facility | Payer: Self-pay | Attending: Internal Medicine

## 2023-06-24 ENCOUNTER — Inpatient Hospital Stay (HOSPITAL_COMMUNITY)
Admission: RE | Admit: 2023-06-24 | Discharge: 2023-06-26 | DRG: 481 | Disposition: A | Discharge status: Rehabilitation Facility | Payer: Medicare Other | Source: Other Acute Inpatient Hospital | Attending: Internal Medicine | Admitting: Internal Medicine

## 2023-06-24 DIAGNOSIS — Z882 Allergy status to sulfonamides status: Secondary | ICD-10-CM

## 2023-06-24 DIAGNOSIS — Z79899 Other long term (current) drug therapy: Secondary | ICD-10-CM

## 2023-06-24 DIAGNOSIS — Z96611 Presence of right artificial shoulder joint: Secondary | ICD-10-CM | POA: Diagnosis present

## 2023-06-24 DIAGNOSIS — Z9181 History of falling: Secondary | ICD-10-CM

## 2023-06-24 DIAGNOSIS — S72141A Displaced intertrochanteric fracture of right femur, initial encounter for closed fracture: Secondary | ICD-10-CM | POA: Diagnosis not present

## 2023-06-24 DIAGNOSIS — R0609 Other forms of dyspnea: Secondary | ICD-10-CM | POA: Diagnosis present

## 2023-06-24 DIAGNOSIS — Z7982 Long term (current) use of aspirin: Secondary | ICD-10-CM

## 2023-06-24 DIAGNOSIS — M069 Rheumatoid arthritis, unspecified: Secondary | ICD-10-CM

## 2023-06-24 DIAGNOSIS — M159 Polyosteoarthritis, unspecified: Secondary | ICD-10-CM | POA: Diagnosis present

## 2023-06-24 DIAGNOSIS — S7291XS Unspecified fracture of right femur, sequela: Secondary | ICD-10-CM | POA: Diagnosis not present

## 2023-06-24 DIAGNOSIS — D62 Acute posthemorrhagic anemia: Secondary | ICD-10-CM | POA: Diagnosis not present

## 2023-06-24 DIAGNOSIS — K219 Gastro-esophageal reflux disease without esophagitis: Secondary | ICD-10-CM | POA: Diagnosis present

## 2023-06-24 DIAGNOSIS — Z90710 Acquired absence of both cervix and uterus: Secondary | ICD-10-CM

## 2023-06-24 DIAGNOSIS — Z880 Allergy status to penicillin: Secondary | ICD-10-CM | POA: Diagnosis not present

## 2023-06-24 DIAGNOSIS — M25551 Pain in right hip: Secondary | ICD-10-CM | POA: Diagnosis present

## 2023-06-24 DIAGNOSIS — S72141G Displaced intertrochanteric fracture of right femur, subsequent encounter for closed fracture with delayed healing: Secondary | ICD-10-CM | POA: Diagnosis not present

## 2023-06-24 DIAGNOSIS — S72101D Unspecified trochanteric fracture of right femur, subsequent encounter for closed fracture with routine healing: Secondary | ICD-10-CM | POA: Diagnosis not present

## 2023-06-24 DIAGNOSIS — Z885 Allergy status to narcotic agent status: Secondary | ICD-10-CM | POA: Diagnosis not present

## 2023-06-24 DIAGNOSIS — M80051A Age-related osteoporosis with current pathological fracture, right femur, initial encounter for fracture: Secondary | ICD-10-CM | POA: Diagnosis present

## 2023-06-24 DIAGNOSIS — Z888 Allergy status to other drugs, medicaments and biological substances status: Secondary | ICD-10-CM | POA: Diagnosis not present

## 2023-06-24 DIAGNOSIS — K5900 Constipation, unspecified: Secondary | ICD-10-CM | POA: Diagnosis not present

## 2023-06-24 DIAGNOSIS — Z7409 Other reduced mobility: Secondary | ICD-10-CM | POA: Diagnosis present

## 2023-06-24 HISTORY — PX: INTRAMEDULLARY (IM) NAIL INTERTROCHANTERIC: SHX5875

## 2023-06-24 LAB — CBC
HCT: 27.8 % — ABNORMAL LOW (ref 36.0–46.0)
Hemoglobin: 8.6 g/dL — ABNORMAL LOW (ref 12.0–15.0)
MCH: 28.2 pg (ref 26.0–34.0)
MCHC: 30.9 g/dL (ref 30.0–36.0)
MCV: 91.1 fL (ref 80.0–100.0)
Platelets: 176 10*3/uL (ref 150–400)
RBC: 3.05 MIL/uL — ABNORMAL LOW (ref 3.87–5.11)
RDW: 15.9 % — ABNORMAL HIGH (ref 11.5–15.5)
WBC: 8.2 10*3/uL (ref 4.0–10.5)
nRBC: 0 % (ref 0.0–0.2)

## 2023-06-24 LAB — COMPREHENSIVE METABOLIC PANEL
ALT: 9 U/L (ref 0–44)
AST: 23 U/L (ref 15–41)
Albumin: 2.7 g/dL — ABNORMAL LOW (ref 3.5–5.0)
Alkaline Phosphatase: 44 U/L (ref 38–126)
Anion gap: 9 (ref 5–15)
BUN: 16 mg/dL (ref 8–23)
CO2: 24 mmol/L (ref 22–32)
Calcium: 8.2 mg/dL — ABNORMAL LOW (ref 8.9–10.3)
Chloride: 104 mmol/L (ref 98–111)
Creatinine, Ser: 0.78 mg/dL (ref 0.44–1.00)
GFR, Estimated: >60 mL/min (ref >60)
Glucose, Bld: 233 mg/dL — ABNORMAL HIGH (ref 70–99)
Potassium: 3.6 mmol/L (ref 3.5–5.1)
Sodium: 137 mmol/L (ref 135–145)
Total Bilirubin: 0.4 mg/dL (ref 0.0–1.2)
Total Protein: 5.7 g/dL — ABNORMAL LOW (ref 6.5–8.1)

## 2023-06-24 LAB — SURGICAL PCR SCREEN
MRSA, PCR: NEGATIVE
Staphylococcus aureus: NEGATIVE

## 2023-06-24 LAB — PROTIME-INR
INR: 1 (ref 0.8–1.2)
Prothrombin Time: 13.4 s (ref 11.4–15.2)

## 2023-06-24 LAB — APTT: aPTT: 26 s (ref 24–36)

## 2023-06-24 SURGERY — FIXATION, FRACTURE, INTERTROCHANTERIC, WITH INTRAMEDULLARY ROD
Anesthesia: General | Site: Hip | Laterality: Right

## 2023-06-24 MED ORDER — MORPHINE SULFATE (PF) 2 MG/ML IV SOLN: 2.0000 mg | INTRAVENOUS | Status: DC | PRN

## 2023-06-24 MED ORDER — ACETAMINOPHEN 325 MG PO TABS
650.0000 mg | ORAL_TABLET | Freq: Four times a day (QID) | ORAL | Status: DC | PRN
  Administered 2023-06-25: 650 mg via ORAL
  Filled 2023-06-24: qty 2

## 2023-06-24 MED ORDER — ONDANSETRON HCL 4 MG/2ML IJ SOLN
INTRAMUSCULAR | Status: AC
  Filled 2023-06-24: qty 2

## 2023-06-24 MED ORDER — VITAMIN D 25 MCG (1000 UNIT) PO TABS
1000.0000 [IU] | ORAL_TABLET | Freq: Every day | ORAL | Status: DC
  Administered 2023-06-24 – 2023-06-26 (×3): 1000 [IU] via ORAL
  Filled 2023-06-24 (×3): qty 1

## 2023-06-24 MED ORDER — PROPOFOL 10 MG/ML IV BOLUS
INTRAVENOUS | Status: AC
  Filled 2023-06-24: qty 20

## 2023-06-24 MED ORDER — ACETAMINOPHEN 650 MG RE SUPP: 650.0000 mg | Freq: Four times a day (QID) | RECTAL | Status: DC | PRN

## 2023-06-24 MED ORDER — POLYETHYLENE GLYCOL 3350 17 G PO PACK: 17.0000 g | PACK | Freq: Every day | ORAL | Status: DC | PRN

## 2023-06-24 MED ORDER — LIDOCAINE 2% (20 MG/ML) 5 ML SYRINGE
INTRAMUSCULAR | Status: AC
  Filled 2023-06-24: qty 5

## 2023-06-24 MED ORDER — DOCUSATE SODIUM 100 MG PO CAPS
100.0000 mg | ORAL_CAPSULE | Freq: Two times a day (BID) | ORAL | Status: DC
  Administered 2023-06-24 – 2023-06-26 (×4): 100 mg via ORAL
  Filled 2023-06-24 (×5): qty 1

## 2023-06-24 MED ORDER — CHLORHEXIDINE GLUCONATE 0.12 % MT SOLN
OROMUCOSAL | Status: AC
  Filled 2023-06-24: qty 15

## 2023-06-24 MED ORDER — SODIUM CHLORIDE 0.9% FLUSH
3.0000 mL | Freq: Two times a day (BID) | INTRAVENOUS | Status: DC
  Administered 2023-06-24 – 2023-06-26 (×4): 3 mL via INTRAVENOUS

## 2023-06-24 MED ORDER — TRANEXAMIC ACID-NACL 1000-0.7 MG/100ML-% IV SOLN: 1000.0000 mg | INTRAVENOUS | Status: DC

## 2023-06-24 MED ORDER — SODIUM CHLORIDE 0.9 % IV SOLN: INTRAVENOUS | Status: DC

## 2023-06-24 MED ORDER — ACETAMINOPHEN 325 MG PO TABS
325.0000 mg | ORAL_TABLET | Freq: Four times a day (QID) | ORAL | Status: DC | PRN
  Filled 2023-06-24: qty 2

## 2023-06-24 MED ORDER — PHENYLEPHRINE 80 MCG/ML (10ML) SYRINGE FOR IV PUSH (FOR BLOOD PRESSURE SUPPORT)
PREFILLED_SYRINGE | INTRAVENOUS | Status: AC
  Filled 2023-06-24: qty 10

## 2023-06-24 MED ORDER — PROPOFOL 500 MG/50ML IV EMUL
INTRAVENOUS | Status: DC | PRN
  Administered 2023-06-24: 80 ug/kg/min via INTRAVENOUS
  Administered 2023-06-24: 50 ug/kg/min via INTRAVENOUS

## 2023-06-24 MED ORDER — TRANEXAMIC ACID-NACL 1000-0.7 MG/100ML-% IV SOLN
INTRAVENOUS | Status: AC
  Filled 2023-06-24: qty 100

## 2023-06-24 MED ORDER — FENTANYL CITRATE (PF) 250 MCG/5ML IJ SOLN
INTRAMUSCULAR | Status: AC
Start: 2023-06-24 — End: ?
  Filled 2023-06-24: qty 5

## 2023-06-24 MED ORDER — DEXAMETHASONE SODIUM PHOSPHATE 10 MG/ML IJ SOLN
INTRAMUSCULAR | Status: AC
  Filled 2023-06-24: qty 1

## 2023-06-24 MED ORDER — MEPERIDINE HCL 25 MG/ML IJ SOLN: 6.2500 mg | INTRAMUSCULAR | Status: DC | PRN

## 2023-06-24 MED ORDER — ONDANSETRON HCL 4 MG PO TABS
4.0000 mg | ORAL_TABLET | Freq: Four times a day (QID) | ORAL | Status: DC | PRN
  Filled 2023-06-24: qty 1

## 2023-06-24 MED ORDER — ASPIRIN 325 MG PO TABS
325.0000 mg | ORAL_TABLET | Freq: Every day | ORAL | Status: DC
  Administered 2023-06-24 – 2023-06-26 (×3): 325 mg via ORAL
  Filled 2023-06-24 (×3): qty 1

## 2023-06-24 MED ORDER — ORAL CARE MOUTH RINSE: 15.0000 mL | Freq: Once | OROMUCOSAL | Status: DC

## 2023-06-24 MED ORDER — CEFAZOLIN SODIUM-DEXTROSE 2-4 GM/100ML-% IV SOLN
2.0000 g | INTRAVENOUS | Status: DC
  Administered 2023-06-24: 2 g via INTRAVENOUS

## 2023-06-24 MED ORDER — ROCURONIUM BROMIDE 10 MG/ML (PF) SYRINGE
PREFILLED_SYRINGE | INTRAVENOUS | Status: AC
  Filled 2023-06-24: qty 10

## 2023-06-24 MED ORDER — ACETAMINOPHEN 500 MG PO TABS
ORAL_TABLET | ORAL | Status: AC
Start: 2023-06-24 — End: 2023-06-24
  Filled 2023-06-24: qty 2

## 2023-06-24 MED ORDER — CEFAZOLIN SODIUM-DEXTROSE 2-4 GM/100ML-% IV SOLN: 2.0000 g | Freq: Once | INTRAVENOUS | Status: DC

## 2023-06-24 MED ORDER — SUGAMMADEX SODIUM 200 MG/2ML IV SOLN
INTRAVENOUS | Status: AC
  Filled 2023-06-24: qty 2

## 2023-06-24 MED ORDER — ONDANSETRON HCL 4 MG/2ML IJ SOLN
INTRAMUSCULAR | Status: DC | PRN
  Administered 2023-06-24: 4 mg via INTRAVENOUS

## 2023-06-24 MED ORDER — CHLORHEXIDINE GLUCONATE 0.12 % MT SOLN: 15.0000 mL | Freq: Once | OROMUCOSAL | Status: DC

## 2023-06-24 MED ORDER — ROCURONIUM BROMIDE 10 MG/ML (PF) SYRINGE
PREFILLED_SYRINGE | INTRAVENOUS | Status: DC | PRN
Start: 2023-06-24 — End: 2023-06-24
  Administered 2023-06-24: 60 mg via INTRAVENOUS

## 2023-06-24 MED ORDER — FOLIC ACID 1 MG PO TABS
1.0000 mg | ORAL_TABLET | Freq: Every day | ORAL | Status: DC
  Administered 2023-06-24 – 2023-06-26 (×3): 1 mg via ORAL
  Filled 2023-06-24 (×3): qty 1

## 2023-06-24 MED ORDER — SUCCINYLCHOLINE CHLORIDE 200 MG/10ML IV SOSY
PREFILLED_SYRINGE | INTRAVENOUS | Status: AC
  Filled 2023-06-24: qty 10

## 2023-06-24 MED ORDER — LACTATED RINGERS IV SOLN: INTRAVENOUS | Status: DC

## 2023-06-24 MED ORDER — CHLORHEXIDINE GLUCONATE 4 % EX SOLN: 60.0000 mL | Freq: Once | CUTANEOUS | Status: DC

## 2023-06-24 MED ORDER — ACETAMINOPHEN 325 MG PO TABS
650.0000 mg | ORAL_TABLET | ORAL | Status: DC
Start: 2023-06-24 — End: 2023-06-27
  Administered 2023-06-24 – 2023-06-26 (×7): 650 mg via ORAL
  Filled 2023-06-24 (×8): qty 2

## 2023-06-24 MED ORDER — OXYCODONE HCL 5 MG/5ML PO SOLN: 5.0000 mg | Freq: Once | ORAL | Status: DC | PRN

## 2023-06-24 MED ORDER — MIDAZOLAM HCL 2 MG/2ML IJ SOLN: 0.5000 mg | Freq: Once | INTRAMUSCULAR | Status: DC | PRN

## 2023-06-24 MED ORDER — FENTANYL CITRATE (PF) 250 MCG/5ML IJ SOLN
INTRAMUSCULAR | Status: DC | PRN
Start: 2023-06-24 — End: 2023-06-24
  Administered 2023-06-24 (×2): 50 ug via INTRAVENOUS

## 2023-06-24 MED ORDER — PHENYLEPHRINE 80 MCG/ML (10ML) SYRINGE FOR IV PUSH (FOR BLOOD PRESSURE SUPPORT)
PREFILLED_SYRINGE | INTRAVENOUS | Status: DC | PRN
Start: 2023-06-24 — End: 2023-06-24
  Administered 2023-06-24 (×5): 80 ug via INTRAVENOUS
  Administered 2023-06-24 (×2): 160 ug via INTRAVENOUS

## 2023-06-24 MED ORDER — CEFAZOLIN SODIUM-DEXTROSE 2-4 GM/100ML-% IV SOLN
INTRAVENOUS | Status: AC
  Filled 2023-06-24: qty 100

## 2023-06-24 MED ORDER — SULFASALAZINE 500 MG PO TABS
1500.0000 mg | ORAL_TABLET | Freq: Every day | ORAL | Status: DC
  Administered 2023-06-24 – 2023-06-26 (×3): 1500 mg via ORAL
  Filled 2023-06-24 (×3): qty 3

## 2023-06-24 MED ORDER — CELECOXIB 100 MG PO CAPS
100.0000 mg | ORAL_CAPSULE | Freq: Two times a day (BID) | ORAL | Status: DC | PRN
Start: 2023-06-24 — End: 2023-06-26
  Administered 2023-06-24 – 2023-06-25 (×2): 100 mg via ORAL
  Filled 2023-06-24 (×3): qty 1

## 2023-06-24 MED ORDER — SUGAMMADEX SODIUM 200 MG/2ML IV SOLN
INTRAVENOUS | Status: DC | PRN
  Administered 2023-06-24: 125 mg via INTRAVENOUS

## 2023-06-24 MED ORDER — PROPOFOL 1000 MG/100ML IV EMUL
INTRAVENOUS | Status: AC
  Filled 2023-06-24: qty 100

## 2023-06-24 MED ORDER — OXYCODONE HCL 5 MG PO TABS
10.0000 mg | ORAL_TABLET | ORAL | Status: DC | PRN
  Filled 2023-06-24: qty 3

## 2023-06-24 MED ORDER — 0.9 % SODIUM CHLORIDE (POUR BTL) OPTIME
TOPICAL | Status: DC | PRN
  Administered 2023-06-24: 1000 mL

## 2023-06-24 MED ORDER — OXYCODONE HCL 5 MG PO TABS
5.0000 mg | ORAL_TABLET | ORAL | Status: DC | PRN
  Filled 2023-06-24: qty 2

## 2023-06-24 MED ORDER — ACETAMINOPHEN 500 MG PO TABS
1000.0000 mg | ORAL_TABLET | Freq: Four times a day (QID) | ORAL | Status: DC
  Administered 2023-06-24: 1000 mg via ORAL
  Filled 2023-06-24: qty 2

## 2023-06-24 MED ORDER — LIDOCAINE 2% (20 MG/ML) 5 ML SYRINGE
INTRAMUSCULAR | Status: DC | PRN
  Administered 2023-06-24: 40 mg via INTRAVENOUS

## 2023-06-24 MED ORDER — HYDROMORPHONE HCL 1 MG/ML IJ SOLN
0.5000 mg | INTRAMUSCULAR | Status: DC | PRN
  Filled 2023-06-24: qty 1

## 2023-06-24 MED ORDER — FENTANYL CITRATE (PF) 100 MCG/2ML IJ SOLN: 25.0000 ug | INTRAMUSCULAR | Status: DC | PRN

## 2023-06-24 MED ORDER — OXYCODONE HCL 5 MG PO TABS: 5.0000 mg | ORAL_TABLET | Freq: Once | ORAL | Status: DC | PRN

## 2023-06-24 MED ORDER — ENOXAPARIN SODIUM 40 MG/0.4ML IJ SOSY
40.0000 mg | PREFILLED_SYRINGE | Freq: Every day | INTRAMUSCULAR | Status: DC
  Administered 2023-06-24 – 2023-06-25 (×2): 40 mg via SUBCUTANEOUS
  Filled 2023-06-24 (×2): qty 0.4

## 2023-06-24 MED ORDER — TRANEXAMIC ACID-NACL 1000-0.7 MG/100ML-% IV SOLN
1000.0000 mg | INTRAVENOUS | Status: DC
  Administered 2023-06-24: 1000 mg via INTRAVENOUS

## 2023-06-24 MED ORDER — ONDANSETRON HCL 4 MG/2ML IJ SOLN
4.0000 mg | Freq: Four times a day (QID) | INTRAMUSCULAR | Status: DC | PRN
  Filled 2023-06-24: qty 2

## 2023-06-24 SURGICAL SUPPLY — 34 items
BAG COUNTER SPONGE SURGICOUNT (BAG) IMPLANT
BIT DRILL 4.3MMS DISTAL GRDTED (BIT) IMPLANT
CHLORAPREP W/TINT 26 (MISCELLANEOUS) ×1 IMPLANT
COVER PERINEAL POST (MISCELLANEOUS) ×1 IMPLANT
COVER SURGICAL LIGHT HANDLE (MISCELLANEOUS) ×1 IMPLANT
DRAPE C-ARMOR (DRAPES) ×1 IMPLANT
DRAPE STERI IOBAN 125X83 (DRAPES) ×1 IMPLANT
DRAPE U-SHAPE 47X51 STRL (DRAPES) ×2 IMPLANT
DRESSING MEPILEX FLEX 4X4 (GAUZE/BANDAGES/DRESSINGS) ×2 IMPLANT
DRILL 4.3MMS DISTAL GRADUATED (BIT) ×3 IMPLANT
DRSG MEPILEX FLEX 4X4 (GAUZE/BANDAGES/DRESSINGS) ×3 IMPLANT
DRSG MEPILEX POST OP 4X8 (GAUZE/BANDAGES/DRESSINGS) IMPLANT
DRSG TEGADERM 4X4.75 (GAUZE/BANDAGES/DRESSINGS) IMPLANT
ELECT REM PT RETURN 15FT ADLT (MISCELLANEOUS) ×1 IMPLANT
GAUZE SPONGE 4X4 12PLY STRL (GAUZE/BANDAGES/DRESSINGS) IMPLANT
GAUZE XEROFORM 5X9 LF (GAUZE/BANDAGES/DRESSINGS) ×1 IMPLANT
GLOVE BIO SURGEON STRL SZ 6 (GLOVE) ×1 IMPLANT
GLOVE BIO SURGEON STRL SZ7.5 (GLOVE) ×1 IMPLANT
GLOVE BIOGEL PI IND STRL 6.5 (GLOVE) ×1 IMPLANT
GLOVE BIOGEL PI IND STRL 8 (GLOVE) ×1 IMPLANT
GOWN STRL REUS W/ TWL LRG LVL3 (GOWN DISPOSABLE) ×2 IMPLANT
GUIDEPIN VERSANAIL DSP 3.2X444 (ORTHOPEDIC DISPOSABLE SUPPLIES) IMPLANT
GUIDEWIRE BALL NOSE 100CM (WIRE) IMPLANT
KIT BASIN OR (CUSTOM PROCEDURE TRAY) ×1 IMPLANT
KIT TURNOVER KIT A (KITS) IMPLANT
NAIL IM AFFIXUS HIP 9X360 125D (Nail) IMPLANT
NS IRRIG 1000ML POUR BTL (IV SOLUTION) ×1 IMPLANT
PACK GENERAL/GYN (CUSTOM PROCEDURE TRAY) ×1 IMPLANT
SCREW BONE CORTICAL 5.0X50 (Screw) IMPLANT
SCREW CANN THRD AFF 10.5X100 (Screw) IMPLANT
STAPLER VISISTAT 35W (STAPLE) ×1 IMPLANT
SUT VIC AB 0 CT1 36 (SUTURE) ×1 IMPLANT
SUT VIC AB 1 CT1 36 (SUTURE) ×1 IMPLANT
SUT VIC AB 2-0 CT1 TAPERPNT 27 (SUTURE) ×1 IMPLANT

## 2023-06-24 NOTE — Anesthesia Preprocedure Evaluation (Addendum)
 Anesthesia Evaluation  Patient identified by MRN, date of birth, ID band Patient awake    Reviewed: Allergy & Precautions, NPO status , [name] Chart, lab work & pertinent test results  History of Anesthesia Complications (+) PONV  Airway Mallampati: I  TM Distance: >3 FB Neck ROM: Full    Dental  (+) Caps, Dental Advisory Given   Pulmonary neg pulmonary ROS   breath sounds clear to auscultation       Cardiovascular negative cardio ROS  Rhythm:Regular Rate:Normal  '23 stress: The study is normal. The study is low risk.   No ST deviation was noted.   Left ventricular function is normal. Nuclear stress EF: 75 %. The left ventricular ejection fraction is hyperdynamic (>65%). End diastolic cavity size is normal.    Neuro/Psych negative neurological ROS     GI/Hepatic Neg liver ROS,GERD  Medicated and Controlled,,  Endo/Other  negative endocrine ROS    Renal/GU negative Renal ROS     Musculoskeletal  (+) Arthritis , Rheumatoid disorders,    Abdominal   Peds  Hematology negative hematology ROS (+)   Anesthesia Other Findings   Reproductive/Obstetrics                             Anesthesia Physical Anesthesia Plan  ASA: 2  Anesthesia Plan: General   Post-op Pain Management: Tylenol PO (pre-op)*   Induction: Intravenous  PONV Risk Score and Plan: 4 or greater and Ondansetron, Dexamethasone, Treatment may vary due to age or medical condition and Propofol infusion  Airway Management Planned: Oral ETT  Additional Equipment: None  Intra-op Plan:   Post-operative Plan: Extubation in OR  Informed Consent: I have reviewed the patients History and Physical, chart, labs and discussed the procedure including the risks, benefits and alternatives for the proposed anesthesia with the patient or authorized representative who has indicated his/her understanding and acceptance.     Dental  advisory given  Plan Discussed with: CRNA and Surgeon  Anesthesia Plan Comments:         Anesthesia Quick Evaluation

## 2023-06-24 NOTE — Assessment & Plan Note (Signed)
 Due to mechanical fall.  Suffered on 520-second 2025.  Patient is s/p cephalomedullary nailing today.  I will order Lovenox for DVT prophylaxis.  Aspirin as per surgical service.  For pain control we will order standing acetaminophen.  Patient otherwise prefers as needed agents only therefore patient ordered for as needed Celebrex twice daily as well as morphine 2 mg as needed for pain control.  Continuous pulse oximetry for next 24 hours to monitor for any hypoxemia due to opiate use.  Bowel regimen ordered

## 2023-06-24 NOTE — H&P (Signed)
 ORTHOPAEDIC CONSULTATION   Chief Complaint: Right hip pain  HPI: [name] is a 83 y.o. female who presents with right hip pain after fall from standing.  This was mechanical fall and tripped.  She is status post right total shoulder arthroplasty with Dr. August Saucer several months prior for which she is doing well.  Denies any acute shoulder pain.  Community ambulator without assistive devices  Past Medical History:  Diagnosis Date   Arthritis    RA   Complication of anesthesia    GERD (gastroesophageal reflux disease)    History of hiatal hernia    IBS (irritable bowel syndrome)    PONV (postoperative nausea and vomiting)    Rheumatoid arthritis (HCC)    Past Surgical History:  Procedure Laterality Date   ABDOMINAL HYSTERECTOMY     BICEPT TENODESIS Right 05/10/2023   Procedure: BICEPS TENODESIS;  Surgeon: Cammy Copa, MD;  Location: [facility] OR;  Service: Orthopedics;  Laterality: Right;   EYE SURGERY Bilateral    cataract   FINGER ARTHROPLASTY Right 03/23/2020   Procedure: Right index finger metacarpal phalangeal arthroplasty with repair as necessary and centralization of the extensor tendon and right middle finger proximal interphalangeal arthroplasty with repair as necessary;  Surgeon: Dominica Severin, MD;  Location: [facility];  Service: Orthopedics;  Laterality: Right;   FOOT SURGERY Left    HAND SURGERY Left    ORIF SHOULDER FRACTURE Left    REVERSE SHOULDER ARTHROPLASTY Right 05/10/2023   Procedure: REVERSE SHOULDER ARTHROPLASTY;  Surgeon: Cammy Copa, MD;  Location: [facility] OR;  Service: Orthopedics;  Laterality: Right;   TONSILLECTOMY     Social History   Socioeconomic History   Marital status: Married    Spouse name: Not on file   Number of children: 1   Years of education: Not on file   Highest education level: Not on file  Occupational History   Not on file  Tobacco Use   Smoking status: Never   Smokeless tobacco: Never  Vaping Use    Vaping status: Never Used  Substance and Sexual Activity   Alcohol use: Yes    Alcohol/week: 5.0 standard drinks of alcohol    Types: 5 Glasses of wine per week   Drug use: Never   Sexual activity: Not on file  Other Topics Concern   Not on file  Social History Narrative   Not on file   Social Drivers of Health   Financial Resource Strain: Not on file  Food Insecurity: Not on file  Transportation Needs: Not on file  Physical Activity: Not on file  Stress: Not on file  Social Connections: Not on file   Family History  Problem Relation Age of Onset   Rheumatic fever Mother    - negative except otherwise stated in the family history section Allergies  Allergen Reactions   Penicillins Swelling    reddness   Sulfa Antibiotics    Prior to Admission medications   Medication Sig Start Date End Date Taking? Authorizing Provider  acetaminophen (TYLENOL) 500 MG tablet Take 1 tablet (500 mg total) by mouth every 6 (six) hours. 05/11/23   Cammy Copa, MD  Ascorbic Acid (VITAMIN C GUMMIES PO) Take 2 each by mouth daily.    [provider]  aspirin EC 81 MG tablet Take 1 tablet (81 mg total) by mouth daily. Swallow whole. 05/11/23   Cammy Copa, MD  cetirizine (ZYRTEC) 10 MG tablet Take 10 mg by mouth daily  as needed for allergies.    [provider]  Cholecalciferol (VITAMIN D3 PO) Take 1 tablet by mouth daily.    [provider]  diclofenac Sodium (VOLTAREN ARTHRITIS PAIN) 1 % GEL Apply 2 g topically daily as needed (pain).    [provider]  docusate sodium (COLACE) 100 MG capsule Take 1 capsule (100 mg total) by mouth 2 (two) times daily. 05/11/23   Cammy Copa, MD  folic acid (FOLVITE) 1 MG tablet Take 1 mg by mouth daily.    [provider]  glucosamine-chondroitin 500-400 MG tablet Take 1 tablet by mouth daily.    [provider]  HYDROcodone-acetaminophen (NORCO/VICODIN) 5-325 MG tablet Take 1-2 tablets by  mouth every 4 (four) hours as needed for moderate pain (pain score 4-6) or severe pain (pain score 7-10). 05/11/23   Cammy Copa, MD  methocarbamol (ROBAXIN) 500 MG tablet Take 1 tablet (500 mg total) by mouth every 6 (six) hours as needed for muscle spasms. 05/11/23   Cammy Copa, MD  naproxen (NAPROSYN) 500 MG tablet Take 500 mg by mouth daily.    [provider]  ondansetron (ZOFRAN) 4 MG tablet Take 1 tablet (4 mg total) by mouth every 6 (six) hours as needed for nausea. 05/11/23   Cammy Copa, MD  sulfaSALAzine (AZULFIDINE) 500 MG tablet Take 1,500 mg by mouth daily.    [provider]  traMADol (ULTRAM) 50 MG tablet Take 1 tablet (50 mg total) by mouth every 6 (six) hours as needed. 05/15/23   Cammy Copa, MD  Turmeric 500 MG CAPS Take 500 mg by mouth daily.    [provider]   No results found.   Positive ROS: All other systems have been reviewed and were otherwise negative with the exception of those mentioned in the HPI and as above.  Physical Exam: General: No acute distress Cardiovascular: No pedal edema Respiratory: No cyanosis, no use of accessory musculature GI: No organomegaly, abdomen is soft and non-tender Skin: No lesions in the area of chief complaint Neurologic: Sensation intact distally Psychiatric: Patient is at baseline mood and affect Lymphatic: No axillary or cervical lymphadenopathy  MUSCULOSKELETAL:  Right hip is shortened externally rotated.  Distal neurosensory exam is intact with 2+ dorsalis pedis pulse on the right foot and all dermatomes intact in the right foot  Independent Imaging Review: X-ray AP pelvis, right femur: Displaced right peritrochanteric hip fracture  Assessment: 83 year old female with a right peritrochanteric hip fracture after a fall from standing.  At this time I did discuss the risks and benefits of surgical intervention and nailing.  I did discuss that this would allow for  earlier mobilization.  I did discuss the specific complications associated with this procedure.  After discussion and planning she is elected for right hip cephalomedullary nailing  Plan: Plan for right hip cephalomedullary nailing   After a lengthy discussion of treatment options, including risks, benefits, alternatives, complications of surgical and nonsurgical conservative options, the patient elected surgical repair.   The patient  is aware of the material risks  and complications including, but not limited to injury to adjacent structures, neurovascular injury, infection, numbness, bleeding, implant failure, thermal burns, stiffness, persistent pain, failure to heal, disease transmission from allograft, need for further surgery, dislocation, anesthetic risks, blood clots, risks of death,and others. The probabilities of surgical success and failure discussed with patient given their particular co-morbidities.The time and nature of expected rehabilitation and recovery was discussed.The patient's questions  were all answered preoperatively.  No barriers to understanding were noted. I explained the natural history of the disease process and Rx rationale.  I explained to the patient what I considered to be reasonable expectations given their personal situation.  The final treatment plan was arrived at through a shared patient decision making process model.   Thank you for the consult and the opportunity to see Ms. Alba Destine, MD [facility] 7:27 AM

## 2023-06-24 NOTE — Assessment & Plan Note (Signed)
 Reportd to me by patient. C.w. salfasalazine.

## 2023-06-24 NOTE — Discharge Instructions (Signed)
 Discharge Instructions    Attending Surgeon: Huel Cote, MD Office Phone Number: [phone]   Diagnosis and Procedures:    Surgeries Performed: Right hip nailing  Discharge Plan:    Diet: Resume usual diet. Begin with light or bland foods.  Drink plenty of fluids.  Activity:  Weight bearing as tolerated right leg. You are advised to go home directly from the hospital or surgical center. Restrict your activities.  GENERAL INSTRUCTIONS: 1.  Please apply ice to your wound to help with swelling and inflammation. This will improve your comfort and your overall recovery following surgery.     2. Please call Dr. Serena Croissant office at [phone] with questions Monday-Friday during business hours. If no one answers, please leave a message and someone should get back to the patient within 24 hours. For emergencies please call 911 or proceed to the emergency room.   3. Patient to notify surgical team if experiences any of the following: Bowel/Bladder dysfunction, uncontrolled pain, nerve/muscle weakness, incision with increased drainage or redness, nausea/vomiting and Fever greater than 101.0 F.  Be alert for signs of infection including redness, streaking, odor, fever or chills. Be alert for excessive pain or bleeding and notify your surgeon immediately.  WOUND INSTRUCTIONS:   Leave your dressing, cast, or splint in place until your post operative visit.  Keep it clean and dry.  Always keep the incision clean and dry until the staples/sutures are removed. If there is no drainage from the incision you should keep it open to air. If there is drainage from the incision you must keep it covered at all times until the drainage stops  Do not soak in a bath tub, hot tub, pool, lake or other body of water until 21 days after your surgery and your incision is completely dry and healed.  If you have removable sutures (or staples) they must be removed 10-14 days (unless otherwise  instructed) from the day of your surgery.     1)  Elevate the extremity as much as possible.  2)  Keep the dressing clean and dry.  3)  Please call us if the dressing becomes wet or dirty.  4)  If you are experiencing worsening pain or worsening swelling, please call.     MEDICATIONS: Resume all previous home medications at the previous prescribed dose and frequency unless otherwise noted Start taking the  pain medications on an as-needed basis as prescribed  Please taper down pain medication over the next week following surgery.  Ideally you should not require a refill of any narcotic pain medication.  Take pain medication with food to minimize nausea. In addition to the prescribed pain medication, you may take over-the-counter pain relievers such as Tylenol.  Do NOT take additional tylenol if your pain medication already has tylenol in it.  Aspirin 325mg  daily per instructions on bottle. Narcotic policy: Per [facility] clinic policy, our goal is ensure optimal postoperative pain control with a multimodal pain management strategy. For all [facility] patients, our goal is to wean post-operative narcotic medications by 6 weeks post-operatively, and many times sooner. If this is not possible due to utilization of pain medication prior to surgery, your [facility] doctor will support your acute post-operative pain control for the first 6 weeks postoperatively, with a plan to transition you back to your primary pain team following that. Cyndia Skeeters will work to ensure a Therapist, occupational.       FOLLOWUP INSTRUCTIONS: 1. Follow up at the Physical Therapy Clinic  3-4 days following surgery. This appointment should be scheduled unless other arrangements have been made.The Physical Therapy scheduling number is [phone] if an appointment has not already been arranged.  2. Contact Dr. Serena Croissant office during office hours at [phone] or the practice after hours line at [phone] for non-emergencies.  For medical emergencies call 911.   Discharge Location: Home

## 2023-06-24 NOTE — Transfer of Care (Signed)
 Immediate Anesthesia Transfer of Care Note  Patient: [facility] Petersen  Procedure(s) Performed: INTRAMEDULLARY (IM) NAIL INTERTROCHANTERIC (Right: Hip)  Patient Location: PACU  Anesthesia Type:General  Level of Consciousness: awake, alert , oriented, and patient cooperative  Airway & Oxygen Therapy: Patient Spontanous Breathing  Post-op Assessment: Report given to RN and Post -op Vital signs reviewed and stable  Post vital signs: Reviewed and stable  Last Vitals:  Vitals Value Taken Time  BP 114/56 06/24/23 0933  Temp    Pulse 87 06/24/23 0936  Resp 20 06/24/23 0936  SpO2 93 % 06/24/23 0936  Vitals shown include unfiled device data.  Last Pain:  Vitals:   06/24/23 0704  TempSrc: Oral         Complications: No notable events documented.

## 2023-06-24 NOTE — H&P (Addendum)
 History and Physical    Patient: Bailey Petersen XBJ:[id number] DOB: 04-20-1941 DOA: 06/24/2023 DOS: the patient was seen and examined on 06/24/2023 PCP: [facility] Pont, DO  Patient coming from: Home> outside [facility]> accepted by my colleage to [place]>went directly to OR> 6n09  Chief Complaint: No chief complaint on file.  HPI: [facility] Petersen is a 83 y.o. female with medical history significant of right shoulder total arthroplasty several months prior.  Patient was in her usual state of health till yesterday when she was standing on a stool to do some errands at home.  Unfortunately she lost balance and fell down on her right lower extremity followed by severe pain in the right thigh.  Patient was initially evaluated at an outside [facility].  Found to have right femoral fracture.  Patient was accepted to [facility] in consultation with orthopedic surgery.  Patient actually arrived to [facility] earlier this morning.  Taken straight to the OR by orthopedic surgery.  At this time patient is s/p Right hip cephulmedullary nail   Patient reports her pain level is 2/10, however when she was helped to the bathroom, her pain was 8/10.  Patient otherwise offers no complaints. Review of Systems: As mentioned in the history of present illness. All other systems reviewed and are negative. Past Medical History:  Diagnosis Date   Arthritis    RA   Complication of anesthesia    GERD (gastroesophageal reflux disease)    History of hiatal hernia    IBS (irritable bowel syndrome)    PONV (postoperative nausea and vomiting)    Rheumatoid arthritis (HCC)    Past Surgical History:  Procedure Laterality Date   ABDOMINAL HYSTERECTOMY     BICEPT TENODESIS Right 05/10/2023   Procedure: BICEPS TENODESIS;  Surgeon: Cammy Copa, MD;  Location: [facility] OR;  Service: Orthopedics;  Laterality: Right;   EYE SURGERY Bilateral    cataract   FINGER ARTHROPLASTY Right 03/23/2020   Procedure: Right index finger  metacarpal phalangeal arthroplasty with repair as necessary and centralization of the extensor tendon and right middle finger proximal interphalangeal arthroplasty with repair as necessary;  Surgeon: Dominica Severin, MD;  Location: [facility];  Service: Orthopedics;  Laterality: Right;   FOOT SURGERY Left    HAND SURGERY Left    ORIF SHOULDER FRACTURE Left    REVERSE SHOULDER ARTHROPLASTY Right 05/10/2023   Procedure: REVERSE SHOULDER ARTHROPLASTY;  Surgeon: Cammy Copa, MD;  Location: [facility] OR;  Service: Orthopedics;  Laterality: Right;   TONSILLECTOMY     Social History:  reports that she has never smoked. She has never used smokeless tobacco. She reports current alcohol use of about 5.0 standard drinks of alcohol per week. She reports that she does not use drugs.  Allergies  Allergen Reactions   Oxycodone Swelling    Tolerates morphine   Penicillins Swelling    reddness   Sulfa Antibiotics   Patient advised me that she has a severe allergic reaction to oxycodone  Family History  Problem Relation Age of Onset   Rheumatic fever Mother     Prior to Admission medications   Medication Sig Start Date End Date Taking? Authorizing Provider  acetaminophen (TYLENOL) 500 MG tablet Take 1 tablet (500 mg total) by mouth every 6 (six) hours. 05/11/23  Yes Cammy Copa, MD  Ascorbic Acid (VITAMIN C GUMMIES PO) Take 2 each by mouth daily.   Yes [provider]  cetirizine (ZYRTEC) 10 MG tablet Take 10  mg by mouth daily as needed for allergies.   Yes [provider]  Cholecalciferol (VITAMIN D3 PO) Take 1 tablet by mouth daily.   Yes [provider]  folic acid (FOLVITE) 1 MG tablet Take 1 mg by mouth daily.   Yes [provider]  glucosamine-chondroitin 500-400 MG tablet Take 1 tablet by mouth daily.   Yes [provider]  naproxen (NAPROSYN) 500 MG tablet Take 500 mg by mouth daily as needed for mild pain (pain score 1-3).   Yes  [provider]  sulfaSALAzine (AZULFIDINE) 500 MG tablet Take 1,500 mg by mouth daily.   Yes [provider]  Turmeric 500 MG CAPS Take 500 mg by mouth daily.   Yes [provider]  HYDROcodone-acetaminophen (NORCO/VICODIN) 5-325 MG tablet Take 1-2 tablets by mouth every 4 (four) hours as needed for moderate pain (pain score 4-6) or severe pain (pain score 7-10). Patient not taking: Reported on 06/24/2023 05/11/23   Cammy Copa, MD  methocarbamol (ROBAXIN) 500 MG tablet Take 1 tablet (500 mg total) by mouth every 6 (six) hours as needed for muscle spasms. Patient not taking: Reported on 06/24/2023 05/11/23   Cammy Copa, MD    Physical Exam: Vitals:   06/24/23 1030 06/24/23 1100 06/24/23 1130 06/24/23 1200  BP: 131/63 (!) 114/59 116/64 112/62  Pulse: 87 83 84 80  Resp: 14 11 (!) 24 10  Temp:      TempSrc:      SpO2: 93% 96% 98% 96%  Weight:      Height:       General: Patient is restful, however seems fully coherent, and gives good account of her presenting illness Patient husband, daughter and granddaughter at bedside.  For the entirety of this encounter. Respiratory exam: Bilateral intravesicular Cardiovascular exam S1-S2 normal Abdomen all quadrant soft nontender Extremities warm without edema.  Distal function intact. Data Reviewed:  Labs on Admission:  Results for orders placed or performed during the hospital encounter of 06/24/23 (from the past 24 hours)  Surgical pcr screen     Status: None   Collection Time: 06/24/23  7:24 AM   Specimen: Nasal Mucosa; Nasal Swab  Result Value Ref Range   MRSA, PCR NEGATIVE NEGATIVE   Staphylococcus aureus NEGATIVE NEGATIVE   Basic Metabolic Panel: No results for input(s): "NA", "K", "CL", "CO2", "GLUCOSE", "BUN", "CREATININE", "CALCIUM", "MG", "PHOS" in the last 168 hours. Liver Function Tests: No results for input(s): "AST", "ALT", "ALKPHOS", "BILITOT", "PROT", "ALBUMIN" in the last 168  hours. No results for input(s): "LIPASE", "AMYLASE" in the last 168 hours. No results for input(s): "AMMONIA" in the last 168 hours. CBC: No results for input(s): "WBC", "NEUTROABS", "HGB", "HCT", "MCV", "PLT" in the last 168 hours. Cardiac Enzymes: No results for input(s): "CKTOTAL", "CKMB", "CKMBINDEX", "TROPONINIHS" in the last 168 hours.  BNP (last 3 results) No results for input(s): "PROBNP" in the last 8760 hours. CBG: No results for input(s): "GLUCAP" in the last 168 hours.  Radiological Exams on Admission:  DG FEMUR, MIN 2 VIEWS RIGHT Result Date: 06/24/2023 CLINICAL DATA:  [id number] Surgery, elective [id number] EXAM: RIGHT FEMUR 2 VIEWS COMPARISON:  X-ray 06/24/2023 FINDINGS: Eleven C-arm fluoroscopic images were obtained intraoperatively and submitted for post operative interpretation. Images demonstrate placement of ORIF hardware traversing proximal right femur fracture with improved alignment. 142 seconds of fluoroscopy time utilized. Radiation dose: 11.98 mGy. Please see the performing provider's procedural report for further detail. IMPRESSION: Intraoperative fluoroscopic guidance for proximal right femur  ORIF. Electronically Signed   By: Duanne Guess D.O.   On: 06/24/2023 13:13   DG C-Arm 1-60 Min-No Report Result Date: 06/24/2023 Fluoroscopy was utilized by the requesting physician.  No radiographic interpretation.   DG Pelvis 1-2 Views Result Date: 06/24/2023 CLINICAL DATA:  Hip pain EXAM: PELVIS - 1-2 VIEW COMPARISON:  None Available. FINDINGS: Bones are diffusely demineralized. Comminuted fracture of the proximal femur noted with varus angulation. SI joints and symphysis pubis unremarkable. IMPRESSION: Comminuted fracture of the proximal femur with varus angulation. Electronically Signed   By: Kennith Center M.D.   On: 06/24/2023 07:48     No intake/output data recorded. Total I/O In: 1300 [I.V.:1100; IV Piggyback:200] Out: 100 [Blood:100]      Assessment and  Plan: * Closed displaced intertrochanteric fracture of right femur (HCC) Due to mechanical fall.  Suffered on 520-second 2025.  Patient is s/p cephalomedullary nailing today.  I will order Lovenox for DVT prophylaxis.  Aspirin as per surgical service.  For pain control we will order standing acetaminophen.  Patient otherwise prefers as needed agents only therefore patient ordered for as needed Celebrex twice daily as well as morphine 2 mg as needed for pain control.  Continuous pulse oximetry for next 24 hours to monitor for any hypoxemia due to opiate use.  Bowel regimen ordered  Rheumatoid arthritis (HCC) Reportd to me by patient. C.w. salfasalazine.    D/w Dr. Steward Drone. Thank you for allowing [place] to take over the care of your patient.    Advance Care Planning: Patient advised to me and corroborated by her husband that she would want to be DO NOT RESUSCITATE.  Other medical interventions such as intubation are okay.  Consults: orhtopedics.  Family Communication: at bedside. All questions answered.  Severity of Illness: The appropriate patient status for this patient is INPATIENT. Inpatient status is judged to be reasonable and necessary in order to provide the required intensity of service to ensure the patient's safety. The patient's presenting symptoms, physical exam findings, and initial radiographic and laboratory data in the context of their chronic comorbidities is felt to place them at high risk for further clinical deterioration. Furthermore, it is not anticipated that the patient will be medically stable for discharge from the hospital within 2 midnights of admission.   * I certify that at the point of admission it is my clinical judgment that the patient will require inpatient hospital care spanning beyond 2 midnights from the point of admission due to high intensity of service, high risk for further deterioration and high frequency of surveillance required.*  Author: Nolberto Hanlon,  MD 06/24/2023 2:23 PM  For on call review [email or web address].

## 2023-06-24 NOTE — Brief Op Note (Signed)
   Brief Op Note  Date of Surgery: 06/24/2023  Preoperative Diagnosis: RIGHT INTERTROCHANTERIC HIP FRACTURE  Postoperative Diagnosis: same  Procedure: Procedure(s): INTRAMEDULLARY (IM) NAIL INTERTROCHANTERIC  Implants: Implant Name Type Inv. Item Serial No. Manufacturer Lot No. LRB No. Used Action  NAIL IM AFFIXUS HIP [id number] [id number] - [id number] Nail NAIL IM AFFIXUS HIP [id number] 125D  [employer](ORTH,TRAU,BIO,SG) [id number] Right 1 Implanted  SCREW CANN THRD AFF 10.5X100 - [id number] Screw SCREW CANN THRD AFF 10.5X100  [employer](ORTH,TRAU,BIO,SG) [id number] Right 1 Implanted  SCREW BONE CORTICAL 5.0X50 - [id number] Screw SCREW BONE CORTICAL 5.0X50  [employer](ORTH,TRAU,BIO,SG) [id number] Right 1 Implanted    Surgeons: Surgeon(s): Huel Cote, MD  Anesthesia: Choice    Estimated Blood Loss: See anesthesia record  Complications: None  Condition to PACU: Stable  Benancio Deeds, MD 06/24/2023 9:25 AM

## 2023-06-24 NOTE — Anesthesia Postprocedure Evaluation (Signed)
 Anesthesia Post Note  Patient: [facility] Petersen  Procedure(s) Performed: INTRAMEDULLARY (IM) NAIL INTERTROCHANTERIC (Right: Hip)     Patient location during evaluation: PACU Anesthesia Type: General Level of consciousness: awake and alert, patient cooperative and oriented Pain management: pain level controlled Vital Signs Assessment: post-procedure vital signs reviewed and stable Respiratory status: spontaneous breathing, nonlabored ventilation and respiratory function stable Cardiovascular status: blood pressure returned to baseline and stable Postop Assessment: no apparent nausea or vomiting Anesthetic complications: no   No notable events documented.  Last Vitals:  Vitals:   06/24/23 0933 06/24/23 0945  BP: (!) 114/56 (!) 121/57  Pulse: 83 76  Resp: 16 17  Temp: 36.6 C   SpO2: 97% 95%    Last Pain:  Vitals:   06/24/23 0933  TempSrc:   PainSc: 0-No pain                 Jodey Burbano,E. [place] Thresher

## 2023-06-24 NOTE — Anesthesia Procedure Notes (Signed)
 Procedure Name: Intubation Date/Time: 06/24/2023 8:15 AM  Performed by: Sonda Primes, CRNAPre-anesthesia Checklist: Patient identified, Emergency Drugs available, Suction available and Patient being monitored Patient Re-evaluated:Patient Re-evaluated prior to induction Oxygen Delivery Method: Circle system utilized Preoxygenation: Pre-oxygenation with 100% oxygen Induction Type: IV induction Ventilation: Mask ventilation without difficulty Laryngoscope Size: Glidescope and 3 Grade View: Grade I Tube type: Oral Tube size: 7.0 mm Number of attempts: 1 Airway Equipment and Method: Stylet and Oral airway Placement Confirmation: ETT inserted through vocal cords under direct vision, positive ETCO2 and breath sounds checked- equal and bilateral Secured at: 21 cm Tube secured with: Tape Dental Injury: Teeth and Oropharynx as per pre-operative assessment

## 2023-06-24 NOTE — Op Note (Signed)
 Date of Surgery: 06/24/2023  INDICATIONS: Bailey Petersen is a 83 y.o.-year-old female with right hip subtrochanteric hip fracture.  The risk and benefits of the procedure were discussed in detail and documented in the pre-operative evaluation.   PREOPERATIVE DIAGNOSIS: 1. Right hip subtrochanteric hip fracture  POSTOPERATIVE DIAGNOSIS: Same.  PROCEDURE: 1. Right hip cephulmedullary nail  SURGEON: Benancio Deeds MD  ASSISTANT: Ardeen Fillers, ATC  ANESTHESIA:  general  IV FLUIDS AND URINE: See anesthesia record.  ANTIBIOTICS: Ancef  ESTIMATED BLOOD LOSS: 100 mL.  IMPLANTS:  Implant Name Type Inv. Item Serial No. Manufacturer Lot No. LRB No. Used Action  NAIL IM AFFIXUS HIP 9X360 [id number] - [id number] Nail NAIL IM AFFIXUS HIP [id number] 125D  [employer](ORTH,TRAU,BIO,SG) [id number] Right 1 Implanted  SCREW CANN THRD AFF 10.5X100 - [id number] Screw SCREW CANN THRD AFF 10.5X100  [employer](ORTH,TRAU,BIO,SG) [id number] Right 1 Implanted  SCREW BONE CORTICAL 5.0X50 - [id number] Screw SCREW BONE CORTICAL 5.0X50  [employer]ON(ORTH,TRAU,BIO,SG) [id number] Right 1 Implanted    DRAINS: None  CULTURES: None  COMPLICATIONS: none  DESCRIPTION OF PROCEDURE:   The patient's chart/medical history was reviewed and decision was made to administer peri-operative trans-exemic acid.  I have reviewed the patient's history and given the presence of a fragility fracture, I have deemed the necessity of a osteoporosis management referral or confirmed that the patient is currently enrolled in a osteoporosis treatment program.   The patient was identified in the preoperative holding area and the correct site was marked according universal protocol with nursing, and was subsequently taken back to the operating room.  Anesthesia was induced.  Antibiotics were given 1 hour prior to skin incision.  The patient was placed on the Hana table.  The traction post was placed.  Gross traction and internal rotation was able to  provide the best reduction.  The patient was subsequently prepped and draped in the usual sterile fashion.   Final timeout was performed.  A posterolateral approach to the greater trochanter was used.  This was done 3 cm proximal to the greater trochanter.  10 blade was used to incise through skin and IT band.  Blunt dissection was performed down to the level of the greater trochanter.  The pin was placed under direct fluoroscopic visualization at the center of the greater trochanter at the tip.  This was malleted into place down to the level of the lesser trochanter.  Opening reamer was then used.  A ball-tipped guidewire was then place to distal metaphyseal bone in the femur.  This was passed across the fracture site.  The ball wire was then measured and the appropriate size nail size long nail in 11mm was taken.  Size 12.5 reamer was used over the guidewire.  Nail was introduced.  The cephalomedullary wire was placed.  Again measurement was taken after confirming center center on the AP lateral fluoroscopy.  The appropriate size screw was then placed into the neck component of the femur.  The screw was tightened and then backed off one quarter term to allow for compression.  Distal interlocks were then placed.  This was done during perfect circle technique.  15 blade was used to incise through skin and IT band.  Drill was then used bicortically.  Screw sizes were measured and then 2 screws were placed both in static fashion.  The jig was removed.  The wounds were thoroughly irrigated.  Final fluoroscopy was confirmed good reduction on AP and laterals.  Wounds were closed in layers of  0 Vicryl 2-0 Vicryl and staples.  An Aquacel dressing was placed. All counts were correct at the end of the case. The patient was awoken and taken to the PACU without complication.      POSTOPERATIVE PLAN: She will be weight bearing as tolerated right hip. She will be seen by PT postop. She will be on aspirin for DVT  prophylaxis. She will likely need discharge to SNF.  Benancio Deeds, MD 9:26 AM

## 2023-06-25 ENCOUNTER — Encounter (HOSPITAL_COMMUNITY): Payer: Self-pay | Admitting: Orthopaedic Surgery

## 2023-06-25 DIAGNOSIS — S72141G Displaced intertrochanteric fracture of right femur, subsequent encounter for closed fracture with delayed healing: Secondary | ICD-10-CM | POA: Diagnosis not present

## 2023-06-25 LAB — BASIC METABOLIC PANEL
Anion gap: 7 (ref 5–15)
BUN: 14 mg/dL (ref 8–23)
CO2: 25 mmol/L (ref 22–32)
Calcium: 8.6 mg/dL — ABNORMAL LOW (ref 8.9–10.3)
Chloride: 106 mmol/L (ref 98–111)
Creatinine, Ser: 0.94 mg/dL (ref 0.44–1.00)
GFR, Estimated: >60 mL/min (ref >60)
Glucose, Bld: 84 mg/dL (ref 70–99)
Potassium: 3.8 mmol/L (ref 3.5–5.1)
Sodium: 138 mmol/L (ref 135–145)

## 2023-06-25 LAB — PROTIME-INR
INR: 1 (ref 0.8–1.2)
Prothrombin Time: 13.5 s (ref 11.4–15.2)

## 2023-06-25 LAB — APTT: aPTT: 32 s (ref 24–36)

## 2023-06-25 MED ORDER — HYDROCODONE-ACETAMINOPHEN 5-325 MG PO TABS
1.0000 | ORAL_TABLET | ORAL | 0 refills | Status: DC | PRN
Start: 2023-06-25 — End: 2023-07-04

## 2023-06-25 NOTE — Progress Notes (Signed)
   06/25/23 1329  TOC Brief Assessment  Insurance and Status Reviewed  Patient has primary care physician Yes  Home environment has been reviewed spouse  Prior level of function: independent  Prior/Current Home Services No current home services  Social Drivers of Health Review SDOH reviewed no interventions necessary  Transition of care needs no transition of care needs at this time (see note)     Inpatient rehab following.      Transition of Care Department [facility]) has reviewed patient and  will continue to monitor patient advancement through interdisciplinary progression rounds. If new patient transition needs arise, please place a TOC consult.

## 2023-06-25 NOTE — Progress Notes (Signed)
 Transition of Care [facility]) - CAGE-AID Screening   Patient Details  Name: Bailey Petersen MRN: [id number] Date of Birth: [birth year]  Transition of Care [facility]) CM/SW Contact:    Leota Sauers, RN Phone Number: 06/25/2023, 9:28 PM   Clinical Narrative:  Patient endorses some alcohol use, denies use of illicit substances. Resources not given at this time.  CAGE-AID Screening:    Have You Ever Felt You Ought to Cut Down on Your Drinking or Drug Use?: No Have People Annoyed You By Critizing Your Drinking Or Drug Use?: No Have You Felt Bad Or Guilty About Your Drinking Or Drug Use?: No Have You Ever Had a Drink or Used Drugs First Thing In The Morning to Steady Your Nerves or to Get Rid of a Hangover?: No CAGE-AID Score: 0  Substance Abuse Education Offered: No

## 2023-06-25 NOTE — Evaluation (Signed)
 Occupational Therapy Evaluation Patient Details Name: Bailey Petersen MRN: [id number] DOB: [birth year] Today's Date: 06/25/2023   History of Present Illness   Pt is an 83 y.o. female who presents 06/24/2023 s/p fall at home, sustaining a R intertrochanteric hip fx. She is now s/p right hip cephulmedullary nail and is WBAT on the RLE. 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     Clinical Impressions Patient is s/p right hip cephulmedullary nail surgery resulting in functional limitations due to the deficits listed below (see OT Problem List). Prior to admit, pt was living at home with her husband, independent with all ADL tasks and functional mobility. She is currently attending outpatient therapy for Right shoulder rehab and has no restrictions.  Patient will benefit from acute skilled OT to increase their safety and independence with ADL and functional mobility for ADL to allow facilitate discharge. Patient will benefit from intensive inpatient follow-up therapy, >3 hours/day. OT will continue to follow pt acutely.        If plan is discharge home, recommend the following:   A little help with walking and/or transfers;A lot of help with bathing/dressing/bathroom;Assist for transportation;Help with stairs or ramp for entrance;Assistance with cooking/housework     Functional Status Assessment   Patient has had a recent decline in their functional status and demonstrates the ability to make significant improvements in function in a reasonable and predictable amount of time.     Equipment Recommendations   None recommended by OT     Recommendations for Other Services   Rehab consult     Precautions/Restrictions   Precautions Precautions: Fall Recall of Precautions/Restrictions: Intact Restrictions Weight Bearing Restrictions Per Provider Order: Yes RLE Weight Bearing Per Provider Order:  Weight bearing as tolerated     Mobility Bed Mobility Overal bed mobility: Needs Assistance Bed Mobility: Supine to Sit     Supine to sit: Min assist     General bed mobility comments: Assist for RLE advancement towards EOB. Increased time and effort required however pt was able to elevate trunk to full sitting position and scoot out fully to EOB without assist.    Transfers Overall transfer level: Needs assistance Equipment used: Rolling walker (2 wheels) Transfers: Sit to/from Stand Sit to Stand: Contact guard assist           General transfer comment: VC's for hand placement on seated surface for safety. Light assist to power up to full stand.      Balance Overall balance assessment: Needs assistance Sitting-balance support: Feet supported, No upper extremity supported Sitting balance-Leahy Scale: Fair     Standing balance support: Bilateral upper extremity supported, During functional activity, Reliant on assistive device for balance Standing balance-Leahy Scale: Poor      ADL either performed or assessed with clinical judgement   ADL Overall ADL's : Needs assistance/impaired Eating/Feeding: Independent;Bed level   Grooming: Oral care;Wash/dry hands;Supervision/safety;Standing;Cueing for safety   Upper Body Bathing: Set up;Sitting   Lower Body Bathing: Maximal assistance;Sit to/from stand   Upper Body Dressing : Set up;Sitting   Lower Body Dressing: Total assistance;Bed level   Toilet Transfer: Minimal assistance;Cueing for sequencing;Cueing for safety;Ambulation;Comfort height toilet;BSC/3in1;Grab bars;Rolling walker (2 wheels)   Toileting- Clothing Manipulation and Hygiene: Minimal assistance;Sit to/from stand               Vision Baseline Vision/History: 1 Wears glasses Ability to See in Adequate Light: 0 Adequate Patient Visual  Report: No change from baseline Vision Assessment?: Wears glasses for reading     Perception Perception: Not  tested       Praxis Praxis: Not tested       Pertinent Vitals/Pain Pain Assessment Pain Assessment: Faces Faces Pain Scale: Hurts little more Pain Location: R hip Pain Descriptors / Indicators: Operative site guarding, Sore Pain Intervention(s): Limited activity within patient's tolerance, Monitored during session, Repositioned, Ice applied     Extremity/Trunk Assessment Upper Extremity Assessment Upper Extremity Assessment: Right hand dominant;Generalized weakness   Lower Extremity Assessment Lower Extremity Assessment: Defer to PT evaluation RLE Deficits / Details: Acute pain, decreased strength and AROM consistent with pre-op diagnosis and subsequent surgery. RLE: Unable to fully assess due to pain   Cervical / Trunk Assessment Cervical / Trunk Assessment: Normal   Communication Communication Communication: No apparent difficulties   Cognition Arousal: Alert Behavior During Therapy: WFL for tasks assessed/performed Cognition: No apparent impairments     Following commands: Intact       Cueing  General Comments   Cueing Techniques: Verbal cues;Gestural cues              Home Living Family/patient expects to be discharged to:: Private residence Living Arrangements: Spouse/significant other Available Help at Discharge: Family;Available 24 hours/day Type of Home: House Home Access: Stairs to enter [employer] of Steps: 1 Entrance Stairs-Rails: None Home Layout: Two level;Able to live on main level with bedroom/bathroom Alternate Level Stairs-Number of Steps: 14 Alternate Level Stairs-Rails: Left Bathroom Shower/Tub: Tub/shower unit;Walk-in shower (Walk in most often)   Bathroom Toilet: Handicapped height Bathroom Accessibility: Yes   Home Equipment: Shower seat - built in;Hand held shower head;Grab bars - tub/shower;Rolling Environmental consultant (2 wheels);BSC/3in1;Cane - single point          Prior Functioning/Environment Prior Level of Function :  Independent/Modified Independent;Driving     OT Problem List: Decreased strength;Impaired balance (sitting and/or standing);Decreased coordination;Decreased knowledge of use of DME or AE;Decreased knowledge of precautions;Pain   OT Treatment/Interventions: Self-care/ADL training;Therapeutic exercise;DME and/or AE instruction;Manual therapy;Modalities;Therapeutic activities;Patient/family education;Balance training      OT Goals(Current goals can be found in the care plan section)   Acute Rehab OT Goals Patient Stated Goal: to go home OT Goal Formulation: With patient Time For Goal Achievement: 07/09/23 Potential to Achieve Goals: Good   OT Frequency:  Min 1X/week    Co-evaluation PT/OT/SLP Co-Evaluation/Treatment: Yes Reason for Co-Treatment: For patient/therapist safety;To address functional/ADL transfers PT goals addressed during session: Mobility/safety with mobility;Balance;Proper use of DME;Strengthening/ROM OT goals addressed during session: ADL's and self-care;Proper use of Adaptive equipment and DME;Strengthening/ROM      AM-PAC OT "6 Clicks" Daily Activity     Outcome Measure Help from another person eating meals?: None Help from another person taking care of personal grooming?: None Help from another person toileting, which includes using toliet, bedpan, or urinal?: A Little Help from another person bathing (including washing, rinsing, drying)?: A Lot Help from another person to put on and taking off regular upper body clothing?: None Help from another person to put on and taking off regular lower body clothing?: Total 6 Click Score: 18   End of Session Equipment Utilized During Treatment: Gait belt;Rolling walker (2 wheels) Nurse Communication: Mobility status  Activity Tolerance: Patient tolerated treatment well;Patient limited by pain Patient left: in chair;with call bell/phone within reach;with chair alarm set  OT Visit Diagnosis: Unsteadiness on feet  (R26.81);Muscle weakness (generalized) (M62.81);History of falling (Z91.81);Pain Pain - Right/Left: Right Pain - part of  body: Leg                Time: 1610-9604 OT Time Calculation (min): 27 min Charges:  OT General Charges $OT Visit: 1 Visit OT Evaluation $OT Eval Moderate Complexity: 1 Mod  [employer], OTR/L,CBIS  Supplemental OT - [facility] and [facility] Secure Chat Preferred    Ralene Gasparyan, Charisse March 06/25/2023, 12:04 PM

## 2023-06-25 NOTE — Hospital Course (Signed)
 83 y.o. female with medical history significant of right shoulder total arthroplasty several months prior.  Patient was in her usual state of health till yesterday when she was standing on a stool to do some errands at home.  Unfortunately she lost balance and fell down on her right lower extremity followed by severe pain in the right thigh.  Patient was initially evaluated at an outside ER.  Found to have right femoral fracture.  Patient was accepted to [facility] in consultation with orthopedic surgery.  Patient actually arrived to [facility] earlier this morning.  Taken straight to the OR by orthopedic surgery.  At this time patient is s/p Right hip cephulmedullary nail

## 2023-06-25 NOTE — Progress Notes (Signed)
   Subjective:  Patient reports pain as mild.  Tolerating diet, voiding.  Pending mobilization with physical therapy  Objective:   VITALS:   Vitals:   06/24/23 1200 06/24/23 1904 06/24/23 2353 06/25/23 0432  BP: 112/62 109/60 (!) 99/59 (!) 107/57  Pulse: 80 78 81 84  Resp: 10 17 14 15   Temp:  97.9 F (36.6 C) 98 F (36.7 C) 97.9 F (36.6 C)  TempSrc:  Oral Oral Oral  SpO2: 96% 92% 94% 96%  Weight:      Height:       Right hip dressings are clean dry and intact.  Sensation is intact to light touch throughout.  Able to flex at the right hip.  Fires tibialis anterior and gastrocsoleus.  2+ dorsalis pedis pulse  Lab Results  Component Value Date   WBC 8.2 06/24/2023   HGB 8.6 (L) 06/24/2023   HCT 27.8 (L) 06/24/2023   MCV 91.1 06/24/2023   PLT 176 06/24/2023     Assessment/Plan:  1 Day Post-Op status post right hip cephalomedullary nailing doing well.  She will mobilize with physical therapy today  - Expected postop acute blood loss anemia - will monitor for symptoms - Patient to work with PT to optimize mobilization safely - DVT ppx - SCDs, ambulation, aspirin 325 daily - WBAT operative extremity - Pain control - multimodal pain management, ATC acetaminophen in conjunction with as needed narcotic (oxycodone), although this should be minimized with other modalities  - Discharge planning pending CM, appreciate coordination     Shaelee Forni 06/25/2023, 7:11 AM

## 2023-06-25 NOTE — Progress Notes (Signed)

## 2023-06-25 NOTE — Plan of Care (Signed)
  Problem: Education: Goal: Knowledge of General Education information will improve Description: Including pain rating scale, medication(s)/side effects and non-pharmacologic comfort measures 06/25/2023 0256 by Roby Lofts, RN Outcome: Progressing 06/25/2023 0256 by Roby Lofts, RN Outcome: Progressing   Problem: Health Behavior/Discharge Planning: Goal: Ability to manage health-related needs will improve 06/25/2023 0256 by Roby Lofts, RN Outcome: Progressing 06/25/2023 0256 by Roby Lofts, RN Outcome: Progressing   Problem: Clinical Measurements: Goal: Ability to maintain clinical measurements within normal limits will improve 06/25/2023 0256 by Roby Lofts, RN Outcome: Progressing 06/25/2023 0256 by Roby Lofts, RN Outcome: Progressing Goal: Will remain free from infection 06/25/2023 0256 by Roby Lofts, RN Outcome: Progressing 06/25/2023 0256 by Roby Lofts, RN Outcome: Progressing Goal: Diagnostic test results will improve 06/25/2023 0256 by Roby Lofts, RN Outcome: Progressing 06/25/2023 0256 by Roby Lofts, RN Outcome: Progressing Goal: Respiratory complications will improve 06/25/2023 0256 by Roby Lofts, RN Outcome: Progressing 06/25/2023 0256 by Roby Lofts, RN Outcome: Progressing Goal: Cardiovascular complication will be avoided 06/25/2023 0256 by Roby Lofts, RN Outcome: Progressing 06/25/2023 0256 by Roby Lofts, RN Outcome: Progressing   Problem: Activity: Goal: Risk for activity intolerance will decrease 06/25/2023 0256 by Roby Lofts, RN Outcome: Progressing 06/25/2023 0256 by Roby Lofts, RN Outcome: Progressing   Problem: Nutrition: Goal: Adequate nutrition will be maintained 06/25/2023 0256 by Roby Lofts, RN Outcome: Progressing 06/25/2023 0256 by Roby Lofts, RN Outcome: Progressing   Problem: Coping: Goal: Level of anxiety will decrease 06/25/2023 0256 by Roby Lofts, RN Outcome: Progressing 06/25/2023 0256 by Roby Lofts, RN Outcome: Progressing   Problem: Elimination: Goal: Will not experience complications related to bowel motility 06/25/2023 0256 by Roby Lofts, RN Outcome: Progressing 06/25/2023 0256 by Roby Lofts, RN Outcome: Progressing Goal: Will not experience complications related to urinary retention 06/25/2023 0256 by Roby Lofts, RN Outcome: Progressing 06/25/2023 0256 by Roby Lofts, RN Outcome: Progressing   Problem: Pain Managment: Goal: General experience of comfort will improve and/or be controlled 06/25/2023 0256 by Roby Lofts, RN Outcome: Progressing 06/25/2023 0256 by Roby Lofts, RN Outcome: Progressing   Problem: Safety: Goal: Ability to remain free from injury will improve 06/25/2023 0256 by Roby Lofts, RN Outcome: Progressing 06/25/2023 0256 by Roby Lofts, RN Outcome: Progressing   Problem: Skin Integrity: Goal: Risk for impaired skin integrity will decrease 06/25/2023 0256 by Roby Lofts, RN Outcome: Progressing 06/25/2023 0256 by Roby Lofts, RN Outcome: Progressing

## 2023-06-25 NOTE — Evaluation (Signed)
 Physical Therapy Evaluation  Patient Details Name: Bailey Petersen MRN: [id number] DOB: May 30, 1940 Today's Date: 06/25/2023  History of Present Illness  Pt is an 83 y/o female who presents 06/24/2023 s/p fall at home, sustaining a R intertrochanteric hip fx. She is now s/p right hip cephulmedullary nail and is WBAT on the RLE. 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  Clinical Impression  Pt admitted with above diagnosis. Pt currently with functional limitations due to the deficits listed below (see PT Problem List). At the time of PT eval pt was able to perform transfers and ambulation with gross min assist to CGA and RW for support. Anticipate pt will progress well, however at this time recommend post-acute rehab >3 hours/day to maximize functional independence and safety. Of note, pt with recent TSA. Pt will benefit from acute skilled PT to increase their independence and safety with mobility to allow discharge.           If plan is discharge home, recommend the following: A little help with walking and/or transfers;A little help with bathing/dressing/bathroom;Assistance with cooking/housework;Assist for transportation;Help with stairs or ramp for entrance   Can travel by private vehicle        Equipment Recommendations None recommended by PT (Pt needs to double check whether she has a walker)  Recommendations for Other Services  Rehab consult    Functional Status Assessment Patient has had a recent decline in their functional status and demonstrates the ability to make significant improvements in function in a reasonable and predictable amount of time.     Precautions / Restrictions Precautions Precautions: Fall Recall of Precautions/Restrictions: Intact Restrictions Weight Bearing Restrictions Per Provider Order: Yes RLE Weight Bearing Per Provider Order: Weight bearing as tolerated       Mobility  Bed Mobility Overal bed mobility: Needs Assistance Bed Mobility: Supine to Sit     Supine to sit: Min assist     General bed mobility comments: Assist for RLE advancement towards EOB. Increased time and effort required however pt was able to elevate trunk to full sitting position and scoot out fully to EOB without assist.    Transfers Overall transfer level: Needs assistance Equipment used: Rolling walker (2 wheels) Transfers: Sit to/from Stand Sit to Stand: Contact guard assist           General transfer comment: VC's for hand placement on seated surface for safety. Light assist to power up to full stand.    Ambulation/Gait Ambulation/Gait assistance: Min assist Gait Distance (Feet): 20 Feet (10 to bathroom, 10 back to chair) Assistive device: Rolling walker (2 wheels) Gait Pattern/deviations: Step-to pattern, Step-through pattern, Decreased stride length, Trunk flexed Gait velocity: Decreased Gait velocity interpretation: <1.31 ft/sec, indicative of household ambulator   General Gait Details: VC's for improved posture, closer walker proximity and forward gaze. Occasional balance support required.  Stairs            Wheelchair Mobility     Tilt Bed    Modified Rankin (Stroke Patients Only)       Balance Overall balance assessment: Needs assistance Sitting-balance support: Feet supported, No upper extremity supported Sitting balance-Leahy Scale: Fair     Standing balance support: Bilateral upper extremity supported, During functional activity, Reliant on assistive device for balance Standing balance-Leahy Scale: Poor  Pertinent Vitals/Pain Pain Assessment Pain Assessment: Faces Faces Pain Scale: Hurts little more Pain Location: R hip Pain Descriptors / Indicators: Operative site guarding, Sore Pain Intervention(s): Limited activity within patient's tolerance, Monitored during session,  Repositioned    Home Living Family/patient expects to be discharged to:: Private residence Living Arrangements: Spouse/significant other Available Help at Discharge: Family;Available 24 hours/day Type of Home: House Home Access: Stairs to enter Entrance Stairs-Rails: None Entrance Stairs-Number of Steps: 1 Alternate Level Stairs-Number of Steps: 14 Home Layout: Two level;Able to live on main level with bedroom/bathroom Home Equipment: Shower seat - built in;Hand held shower head;Grab bars - tub/shower;Rolling Environmental consultant (2 wheels);BSC/3in1;Cane - single point      Prior Function Prior Level of Function : Independent/Modified Independent;Driving                     Extremity/Trunk Assessment   Upper Extremity Assessment Upper Extremity Assessment: Defer to OT evaluation    Lower Extremity Assessment Lower Extremity Assessment: RLE deficits/detail RLE Deficits / Details: Acute pain, decreased strength and AROM consistent with pre-op diagnosis and subsequent surgery. RLE: Unable to fully assess due to pain    Cervical / Trunk Assessment Cervical / Trunk Assessment: Normal  Communication   Communication Communication: No apparent difficulties    Cognition Arousal: Alert Behavior During Therapy: WFL for tasks assessed/performed   PT - Cognitive impairments: No apparent impairments                         Following commands: Intact       Cueing Cueing Techniques: Verbal cues, Gestural cues     General Comments      Exercises General Exercises - Lower Extremity Ankle Circles/Pumps: 10 reps Quad Sets: 10 reps   Assessment/Plan    PT Assessment Patient needs continued PT services  PT Problem List Decreased strength;Decreased range of motion;Decreased activity tolerance;Decreased balance;Decreased mobility;Decreased knowledge of use of DME;Decreased safety awareness;Decreased knowledge of precautions;Pain       PT Treatment Interventions DME  instruction;Gait training;Stair training;Functional mobility training;Therapeutic activities;Therapeutic exercise;Balance training;Patient/family education    PT Goals (Current goals can be found in the Care Plan section)  Acute Rehab PT Goals Patient Stated Goal: Home at d/c, be able to walk down the aisle at her granddaughter's wedding PT Goal Formulation: With patient Time For Goal Achievement: 07/09/23 Potential to Achieve Goals: Good    Frequency Min 1X/week     Co-evaluation PT/OT/SLP Co-Evaluation/Treatment: Yes Reason for Co-Treatment: For patient/therapist safety;To address functional/ADL transfers PT goals addressed during session: Mobility/safety with mobility;Balance;Proper use of DME;Strengthening/ROM         AM-PAC PT "6 Clicks" Mobility  Outcome Measure Help needed turning from your back to your side while in a flat bed without using bedrails?: A Little Help needed moving from lying on your back to sitting on the side of a flat bed without using bedrails?: A Little Help needed moving to and from a bed to a chair (including a wheelchair)?: A Little Help needed standing up from a chair using your arms (e.g., wheelchair or bedside chair)?: A Little Help needed to walk in hospital room?: A Little Help needed climbing 3-5 steps with a railing? : A Lot 6 Click Score: 17    End of Session Equipment Utilized During Treatment: Gait belt Activity Tolerance: Patient tolerated treatment well Patient left: in chair;with call bell/phone within reach;with chair alarm set Nurse Communication: Mobility status PT Visit Diagnosis: Unsteadiness on  feet (R26.81);Pain Pain - Right/Left: Right Pain - part of body: Hip    Time: 1020-1045 PT Time Calculation (min) (ACUTE ONLY): 25 min   Charges:   PT Evaluation $PT Eval Moderate Complexity: 1 Mod   PT General Charges $$ ACUTE PT VISIT: 1 Visit         Conni Slipper, PT, DPT Acute Rehabilitation Services Secure Chat  Preferred Office: [phone]   Marylynn Pearson 06/25/2023, 11:10 AM

## 2023-06-25 NOTE — Progress Notes (Signed)
  Progress Note   Patient: Bailey Petersen ZOX:[id number] DOB: November 10, 1940 DOA: 06/24/2023     1 DOS: the patient was seen and examined on 06/25/2023   Brief hospital course: 83 y.o. female with medical history significant of right shoulder total arthroplasty several months prior.  Patient was in her usual state of health till yesterday when she was standing on a stool to do some errands at home.  Unfortunately she lost balance and fell down on her right lower extremity followed by severe pain in the right thigh.  Patient was initially evaluated at an outside ER.  Found to have right femoral fracture.  Patient was accepted to [facility] in consultation with orthopedic surgery.  Patient actually arrived to [facility] earlier this morning.  Taken straight to the OR by orthopedic surgery.  At this time patient is s/p Right hip cephulmedullary nail   Assessment and Plan: Closed displaced intertrochanteric fracture of right femur [facility]) -Orthopedic Surgery following, femur xray reviewed Secondary to mechanical fall.  Patient is s/p cephalomedullary nailing on 2/23.  -Therapy recs for possible CIR, will f/u on recs -continue analgesia as needed   Rheumatoid arthritis (HCC) -Cont with salfasalazine. -seems stable at this time      Subjective: Without complaints. Doing well since surgery yesterday  Physical Exam: Vitals:   06/25/23 0432 06/25/23 0736 06/25/23 1154 06/25/23 1541  BP: (!) 107/57 (!) 113/59 101/72 (!) 114/59  Pulse: 84 86 87 (!) 102  Resp: 15 17 17 17   Temp: 97.9 F (36.6 C) 98.3 F (36.8 C) 98.4 F (36.9 C) 98.2 F (36.8 C)  TempSrc: Oral Oral Oral Oral  SpO2: 96% 97% 95% 100%  Weight:      Height:       General exam: Awake, laying in bed, in nad Respiratory system: Normal respiratory effort, no wheezing Cardiovascular system: regular rate, s1, s2 Gastrointestinal system: Soft, nondistended, positive BS Central nervous system: CN2-12 grossly intact, strength  intact Extremities: Perfused, no clubbing Skin: Normal skin turgor, no notable skin lesions seen Psychiatry: Mood normal // no visual hallucinations   Data Reviewed: Labs reviewed: Na 138, K 3.8, Cr 0.94  Family Communication: Pt in room, family at bedside  Disposition: Status is: Inpatient Remains inpatient appropriate because: severity of illness  Planned Discharge Destination: Rehab     Author: Rickey Barbara, MD 06/25/2023 6:12 PM  For on call review [email or web address].

## 2023-06-26 ENCOUNTER — Other Ambulatory Visit: Payer: Self-pay

## 2023-06-26 ENCOUNTER — Encounter (HOSPITAL_COMMUNITY): Payer: Self-pay | Admitting: Orthopaedic Surgery

## 2023-06-26 ENCOUNTER — Inpatient Hospital Stay (HOSPITAL_COMMUNITY)
Admission: AD | Admit: 2023-06-26 | Discharge: 2023-07-04 | DRG: 560 | Disposition: A | Discharge status: Home / Self Care | Payer: Medicare Other | Source: Intra-hospital | Attending: Physical Medicine and Rehabilitation | Admitting: Physical Medicine and Rehabilitation

## 2023-06-26 DIAGNOSIS — D62 Acute posthemorrhagic anemia: Secondary | ICD-10-CM | POA: Diagnosis present

## 2023-06-26 DIAGNOSIS — Z79899 Other long term (current) drug therapy: Secondary | ICD-10-CM | POA: Diagnosis not present

## 2023-06-26 DIAGNOSIS — M069 Rheumatoid arthritis, unspecified: Secondary | ICD-10-CM | POA: Diagnosis present

## 2023-06-26 DIAGNOSIS — Z882 Allergy status to sulfonamides status: Secondary | ICD-10-CM

## 2023-06-26 DIAGNOSIS — S72101D Unspecified trochanteric fracture of right femur, subsequent encounter for closed fracture with routine healing: Principal | ICD-10-CM

## 2023-06-26 DIAGNOSIS — Z96611 Presence of right artificial shoulder joint: Secondary | ICD-10-CM | POA: Diagnosis present

## 2023-06-26 DIAGNOSIS — S7291XS Unspecified fracture of right femur, sequela: Principal | ICD-10-CM

## 2023-06-26 DIAGNOSIS — M159 Polyosteoarthritis, unspecified: Secondary | ICD-10-CM | POA: Diagnosis present

## 2023-06-26 DIAGNOSIS — Z888 Allergy status to other drugs, medicaments and biological substances status: Secondary | ICD-10-CM | POA: Diagnosis not present

## 2023-06-26 DIAGNOSIS — R0609 Other forms of dyspnea: Secondary | ICD-10-CM | POA: Diagnosis present

## 2023-06-26 DIAGNOSIS — G8918 Other acute postprocedural pain: Secondary | ICD-10-CM | POA: Insufficient documentation

## 2023-06-26 DIAGNOSIS — K5900 Constipation, unspecified: Secondary | ICD-10-CM | POA: Diagnosis present

## 2023-06-26 DIAGNOSIS — Z880 Allergy status to penicillin: Secondary | ICD-10-CM

## 2023-06-26 DIAGNOSIS — Z885 Allergy status to narcotic agent status: Secondary | ICD-10-CM

## 2023-06-26 DIAGNOSIS — Z7409 Other reduced mobility: Secondary | ICD-10-CM | POA: Diagnosis present

## 2023-06-26 DIAGNOSIS — S72141A Displaced intertrochanteric fracture of right femur, initial encounter for closed fracture: Secondary | ICD-10-CM | POA: Diagnosis not present

## 2023-06-26 DIAGNOSIS — K219 Gastro-esophageal reflux disease without esophagitis: Secondary | ICD-10-CM | POA: Diagnosis present

## 2023-06-26 DIAGNOSIS — Z90710 Acquired absence of both cervix and uterus: Secondary | ICD-10-CM

## 2023-06-26 LAB — COMPREHENSIVE METABOLIC PANEL
ALT: 10 U/L (ref 0–44)
AST: 26 U/L (ref 15–41)
Albumin: 2.9 g/dL — ABNORMAL LOW (ref 3.5–5.0)
Alkaline Phosphatase: 47 U/L (ref 38–126)
Anion gap: 7 (ref 5–15)
BUN: 14 mg/dL (ref 8–23)
CO2: 23 mmol/L (ref 22–32)
Calcium: 8.1 mg/dL — ABNORMAL LOW (ref 8.9–10.3)
Chloride: 109 mmol/L (ref 98–111)
Creatinine, Ser: 0.72 mg/dL (ref 0.44–1.00)
GFR, Estimated: >60 mL/min (ref >60)
Glucose, Bld: 149 mg/dL — ABNORMAL HIGH (ref 70–99)
Potassium: 3.5 mmol/L (ref 3.5–5.1)
Sodium: 139 mmol/L (ref 135–145)
Total Bilirubin: 0.3 mg/dL (ref 0.0–1.2)
Total Protein: 6.1 g/dL — ABNORMAL LOW (ref 6.5–8.1)

## 2023-06-26 LAB — CBC
HCT: 27.2 % — ABNORMAL LOW (ref 36.0–46.0)
Hemoglobin: 8.3 g/dL — ABNORMAL LOW (ref 12.0–15.0)
MCH: 27.9 pg (ref 26.0–34.0)
MCHC: 30.5 g/dL (ref 30.0–36.0)
MCV: 91.3 fL (ref 80.0–100.0)
Platelets: 193 10*3/uL (ref 150–400)
RBC: 2.98 MIL/uL — ABNORMAL LOW (ref 3.87–5.11)
RDW: 16.1 % — ABNORMAL HIGH (ref 11.5–15.5)
WBC: 7.2 10*3/uL (ref 4.0–10.5)
nRBC: 0 % (ref 0.0–0.2)

## 2023-06-26 MED ORDER — GUAIFENESIN-DM 100-10 MG/5ML PO SYRP: 5.0000 mL | ORAL_SOLUTION | Freq: Four times a day (QID) | ORAL | Status: DC | PRN

## 2023-06-26 MED ORDER — NAPROXEN 250 MG PO TABS
500.0000 mg | ORAL_TABLET | Freq: Every day | ORAL | Status: DC | PRN
Start: 2023-06-26 — End: 2023-06-26
  Filled 2023-06-26: qty 2

## 2023-06-26 MED ORDER — PROCHLORPERAZINE MALEATE 5 MG PO TABS: 5.0000 mg | ORAL_TABLET | Freq: Four times a day (QID) | ORAL | Status: DC | PRN

## 2023-06-26 MED ORDER — FOLIC ACID 1 MG PO TABS
1.0000 mg | ORAL_TABLET | Freq: Every day | ORAL | Status: DC
  Administered 2023-06-27 – 2023-07-04 (×8): 1 mg via ORAL
  Filled 2023-06-26 (×8): qty 1

## 2023-06-26 MED ORDER — DOCUSATE SODIUM 100 MG PO CAPS
100.0000 mg | ORAL_CAPSULE | Freq: Two times a day (BID) | ORAL | Status: DC
  Administered 2023-06-27 – 2023-07-04 (×15): 100 mg via ORAL
  Filled 2023-06-26 (×16): qty 1

## 2023-06-26 MED ORDER — TRAZODONE HCL 50 MG PO TABS: 25.0000 mg | ORAL_TABLET | Freq: Every evening | ORAL | Status: DC | PRN

## 2023-06-26 MED ORDER — VITAMIN D 25 MCG (1000 UNIT) PO TABS
1000.0000 [IU] | ORAL_TABLET | Freq: Every day | ORAL | Status: DC
  Administered 2023-06-27: 1000 [IU] via ORAL
  Filled 2023-06-26: qty 1

## 2023-06-26 MED ORDER — BISACODYL 10 MG RE SUPP: 10.0000 mg | Freq: Every day | RECTAL | Status: DC | PRN

## 2023-06-26 MED ORDER — FLEET ENEMA RE ENEM: 1.0000 | ENEMA | Freq: Once | RECTAL | Status: DC | PRN

## 2023-06-26 MED ORDER — DIPHENHYDRAMINE HCL 25 MG PO CAPS: 25.0000 mg | ORAL_CAPSULE | Freq: Four times a day (QID) | ORAL | Status: DC | PRN

## 2023-06-26 MED ORDER — ACETAMINOPHEN 325 MG PO TABS
650.0000 mg | ORAL_TABLET | Freq: Three times a day (TID) | ORAL | Status: DC
  Administered 2023-06-26 – 2023-07-04 (×31): 650 mg via ORAL
  Filled 2023-06-26 (×30): qty 2

## 2023-06-26 MED ORDER — ALUM & MAG HYDROXIDE-SIMETH 200-200-20 MG/5ML PO SUSP: 30.0000 mL | ORAL | Status: DC | PRN

## 2023-06-26 MED ORDER — PROCHLORPERAZINE 25 MG RE SUPP
12.5000 mg | Freq: Four times a day (QID) | RECTAL | Status: DC | PRN
  Filled 2023-06-26: qty 1

## 2023-06-26 MED ORDER — MELATONIN 5 MG PO TABS: 5.0000 mg | ORAL_TABLET | Freq: Every evening | ORAL | Status: DC | PRN

## 2023-06-26 MED ORDER — MORPHINE SULFATE 15 MG PO TABS: 15.0000 mg | ORAL_TABLET | ORAL | Status: DC | PRN

## 2023-06-26 MED ORDER — ASPIRIN 325 MG PO TABS: 325.0000 mg | ORAL_TABLET | Freq: Every day | ORAL | Status: AC

## 2023-06-26 MED ORDER — ACETAMINOPHEN 325 MG PO TABS
325.0000 mg | ORAL_TABLET | ORAL | Status: DC | PRN
  Filled 2023-06-26: qty 2

## 2023-06-26 MED ORDER — ENOXAPARIN SODIUM 40 MG/0.4ML IJ SOSY
40.0000 mg | PREFILLED_SYRINGE | INTRAMUSCULAR | Status: DC
  Administered 2023-06-26 – 2023-07-03 (×8): 40 mg via SUBCUTANEOUS
  Filled 2023-06-26 (×8): qty 0.4

## 2023-06-26 MED ORDER — DICLOFENAC SODIUM 1 % EX GEL
2.0000 g | Freq: Four times a day (QID) | CUTANEOUS | Status: DC
  Administered 2023-06-26 – 2023-07-03 (×25): 2 g via TOPICAL
  Filled 2023-06-26: qty 100

## 2023-06-26 MED ORDER — ASPIRIN 81 MG PO TBEC: 81.0000 mg | DELAYED_RELEASE_TABLET | Freq: Every day | ORAL | Status: DC

## 2023-06-26 MED ORDER — ASPIRIN 81 MG PO TBEC
81.0000 mg | DELAYED_RELEASE_TABLET | Freq: Two times a day (BID) | ORAL | Status: DC | PRN
  Administered 2023-06-30: 81 mg via ORAL
  Filled 2023-06-26: qty 1

## 2023-06-26 MED ORDER — PROCHLORPERAZINE EDISYLATE 10 MG/2ML IJ SOLN: 5.0000 mg | Freq: Four times a day (QID) | INTRAMUSCULAR | Status: DC | PRN

## 2023-06-26 MED ORDER — TRAMADOL HCL 50 MG PO TABS
50.0000 mg | ORAL_TABLET | Freq: Four times a day (QID) | ORAL | Status: DC | PRN
  Administered 2023-06-26 – 2023-07-04 (×13): 50 mg via ORAL
  Filled 2023-06-26 (×13): qty 1

## 2023-06-26 MED ORDER — HYDROCODONE-ACETAMINOPHEN 5-325 MG PO TABS: 1.0000 | ORAL_TABLET | ORAL | Status: DC | PRN

## 2023-06-26 MED ORDER — SULFASALAZINE 500 MG PO TABS
1500.0000 mg | ORAL_TABLET | Freq: Every day | ORAL | Status: DC
  Administered 2023-06-27 – 2023-07-04 (×8): 1500 mg via ORAL
  Filled 2023-06-26 (×9): qty 3

## 2023-06-26 NOTE — Progress Notes (Signed)
 PMR Admission Coordinator Pre-Admission Assessment   Patient: Bailey Petersen is an 83 y.o., female MRN: [id number] DOB: 1940-08-16 Height: 5\' 3"  (160 cm) Weight: 59 kg   Insurance Information HMO:     PPO:      PCP:      IPA:      80/20: yes     OTHER:  PRIMARY: Medicare A and B      Policy#: [id number]   Subscriber:  CM Name:       Phone#:      Fax#:  Pre-Cert#: verified Health and safety inspector:  Benefits:  Phone #:      Name:  Eff. Date: Part a 04/01/2007 and Part B 10/29/2009    Deduct: $1632      Out of Pocket Max: n/a      Life Max: n/a CIR: 100%      SNF: 20 full days Outpatient:      Co-Pay:  Home Health: 100%      Co-Pay:  DME:      Co-Pay:  Providers:  SECONDARY: [employer]      Policy#:      Phone#:    Artist:       Phone#:    The Data processing manager" for patients in Inpatient Rehabilitation Facilities with attached "Privacy Act Statement-Health Care Records" was provided and verbally reviewed with: Patient   Emergency Contact Information Contact Information       Name Relation Home Work Mobile    [place] Spouse [phone]   [phone]         Other Contacts   None on File        Current Medical History  Patient Admitting Diagnosis: Hip fracture  History of Present Illness: Pt is an 83 y.o. 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 Pt. Presented to the [facility] ED on 06/24/23 Patient was initially evaluated at an outside ER and  Found to have closed displaced intertrochanteric fracture of right femur . Patient was accepted to [facility] in consultation with orthopedic surgery on 06/24/23. She Undwerwent ephalomedullary nailing on 06/24/23. Pt. Was seen by PT/OT and they recommended CIR to assist return to PLOF.    [name] medical record from [facility] has been reviewed by the  rehabilitation admission coordinator and physician.   Past Medical History      Past Medical History:  Diagnosis Date   Arthritis      RA   Complication of anesthesia     GERD (gastroesophageal reflux disease)     History of hiatal hernia     IBS (irritable bowel syndrome)     PONV (postoperative nausea and vomiting)     Rheumatoid arthritis (HCC)            Has the patient had major surgery during 100 days prior to admission? Yes   Family History   family history includes Rheumatic fever in her mother.   Current Medications  Current Medications    Current Facility-Administered Medications:    acetaminophen (TYLENOL) tablet 650 mg, 650 mg, Oral, Q6H PRN, 650 mg at 06/25/23 0953 **OR** acetaminophen (TYLENOL) suppository 650 mg, 650 mg, Rectal, Q6H PRN, Nolberto Hanlon, MD   acetaminophen (TYLENOL) tablet 650 mg, 650 mg, Oral, Q4H, Nolberto Hanlon, MD, 650 mg at 06/26/23 0515   aspirin tablet 325 mg,  325 mg, Oral, Daily, Huel Cote, MD, 325 mg at 06/26/23 2956   celecoxib (CELEBREX) capsule 100 mg, 100 mg, Oral, BID PRN, Nolberto Hanlon, MD, 100 mg at 06/25/23 2130   cholecalciferol (VITAMIN D3) 25 MCG (1000 UNIT) tablet 1,000 Units, 1,000 Units, Oral, Daily, Huel Cote, MD, 1,000 Units at 06/26/23 0717   docusate sodium (COLACE) capsule 100 mg, 100 mg, Oral, BID, Huel Cote, MD, 100 mg at 06/26/23 0718   enoxaparin (LOVENOX) injection 40 mg, 40 mg, Subcutaneous, QHS, Nolberto Hanlon, MD, 40 mg at 06/25/23 2115   folic acid (FOLVITE) tablet 1 mg, 1 mg, Oral, Daily, Huel Cote, MD, 1 mg at 06/26/23 8657   morphine (PF) 2 MG/ML injection 2 mg, 2 mg, Intravenous, Q1H PRN, Nolberto Hanlon, MD   ondansetron (ZOFRAN) tablet 4 mg, 4 mg, Oral, Q6H PRN **OR** ondansetron (ZOFRAN) injection 4 mg, 4 mg, Intravenous, Q6H PRN, Huel Cote, MD   polyethylene glycol (MIRALAX / GLYCOLAX) packet 17 g, 17 g, Oral, Daily PRN, Nolberto Hanlon, MD   sodium chloride flush (NS) 0.9 % injection 3 mL, 3  mL, Intravenous, Q12H, Nolberto Hanlon, MD, 3 mL at 06/26/23 8469   sulfaSALAzine (AZULFIDINE) tablet 1,500 mg, 1,500 mg, Oral, Daily, Nolberto Hanlon, MD, 1,500 mg at 06/26/23 6295     Patients Current Diet:  Diet Order                  Diet regular Room service appropriate? Yes; Fluid consistency: Thin  Diet effective now                         Precautions / Restrictions Precautions Precautions: Fall Restrictions Weight Bearing Restrictions Per Provider Order: Yes RLE Weight Bearing Per Provider Order: Weight bearing as tolerated    Has the patient had 2 or more falls or a fall with injury in the past year? Yes   Prior Activity Level Community (5-7x/wk): Pt. active in the community PTA   Prior Functional Level Self Care: Did the patient need help bathing, dressing, using the toilet or eating? Independent   Indoor Mobility: Did the patient need assistance with walking from room to room (with or without device)? Independent   Stairs: Did the patient need assistance with internal or external stairs (with or without device)? Independent   Functional Cognition: Did the patient need help planning regular tasks such as shopping or remembering to take medications? Independent   Patient Information Are you of Hispanic, Latino/a,or Spanish origin?: A. No, not of Hispanic, Latino/a, or Spanish origin What is your race?: A. White Do you need or want an interpreter to communicate with a doctor or health care staff?: 0. No   [name] Response To:  Health Literacy and Transportation Is the patient able to respond to health literacy and transportation needs?: Yes Health Literacy - How often do you need to have someone help you when you read instructions, pamphlets, or other written material from your doctor or pharmacy?: Never In the past 12 months, has lack of transportation kept you from medical appointments or from getting medications?: No In the past 12 months, has lack of  transportation kept you from meetings, work, or from getting things needed for daily living?: No   Journalist, newspaper / Equipment Home Equipment: Shower seat - built in, Higher education careers adviser held shower head, Grab bars - tub/shower, Agricultural consultant (2 wheels), BSC/3in1, Cane - single point   Prior Device Use: Indicate devices/aids used by the patient  prior to current illness, exacerbation or injury? None of the above   Current Functional Level Cognition   Orientation Level: Oriented X4    Extremity Assessment (includes Sensation/Coordination)   Upper Extremity Assessment: Right hand dominant, Generalized weakness  Lower Extremity Assessment: Defer to PT evaluation RLE Deficits / Details: Acute pain, decreased strength and AROM consistent with pre-op diagnosis and subsequent surgery. RLE: Unable to fully assess due to pain     ADLs   Overall ADL's : Needs assistance/impaired Eating/Feeding: Independent, Bed level Grooming: Oral care, Wash/dry hands, Supervision/safety, Standing, Cueing for safety Upper Body Bathing: Set up, Sitting Lower Body Bathing: Maximal assistance, Sit to/from stand Upper Body Dressing : Set up, Sitting Lower Body Dressing: Total assistance, Bed level Toilet Transfer: Minimal assistance, Cueing for sequencing, Cueing for safety, Ambulation, Comfort height toilet, BSC/3in1, Grab bars, Rolling walker (2 wheels) Toileting- Clothing Manipulation and Hygiene: Minimal assistance, Sit to/from stand     Mobility   Overal bed mobility: Needs Assistance Bed Mobility: Supine to Sit Supine to sit: Min assist General bed mobility comments: Assist for RLE advancement towards EOB. Increased time and effort required however pt was able to elevate trunk to full sitting position and scoot out fully to EOB without assist.     Transfers   Overall transfer level: Needs assistance Equipment used: Rolling walker (2 wheels) Transfers: Sit to/from Stand Sit to Stand: Contact guard assist, Min  assist General transfer comment: VC's for hand placement on seated surface for safety. Light assist to power up to full stand.     Ambulation / Gait / Stairs / Wheelchair Mobility   Ambulation/Gait Ambulation/Gait assistance: Editor, commissioning (Feet): 40 Feet Assistive device: Rolling walker (2 wheels) Gait Pattern/deviations: Step-to pattern, Step-through pattern, Decreased stride length, Trunk flexed General Gait Details: VC's for improved posture, closer walker proximity and forward gaze. Occasional balance support required. Gait velocity: Decreased Gait velocity interpretation: <1.31 ft/sec, indicative of household ambulator     Posture / Balance Balance Overall balance assessment: Needs assistance Sitting-balance support: Feet supported, No upper extremity supported Sitting balance-Leahy Scale: Fair Standing balance support: Bilateral upper extremity supported, During functional activity, Reliant on assistive device for balance Standing balance-Leahy Scale: Poor     Special needs/care consideration Special service needs none     Previous Home Environment (from acute therapy documentation) Living Arrangements: Spouse/significant other Available Help at Discharge: Family, Available 24 hours/day Type of Home: House Home Layout: Two level, Able to live on main level with bedroom/bathroom Alternate Level Stairs-Rails: Left Alternate Level Stairs-Number of Steps: 14 Home Access: Stairs to enter Entrance Stairs-Rails: None Entrance Stairs-Number of Steps: 1 Bathroom Shower/Tub: Tub/shower unit, Walk-in shower (Walk in most often) Bathroom Toilet: Handicapped height Bathroom Accessibility: Yes Home Care Services: No   Discharge Living Setting Plans for Discharge Living Setting: [name] home Type of Home at Discharge: House Discharge Home Layout: Two level, Able to live on main level with bedroom/bathroom Alternate Level Stairs-Rails: Left Alternate Level Stairs-Number  of Steps: 14 Discharge Home Access: Stairs to enter Entrance Stairs-Rails: None Entrance Stairs-Number of Steps: 1 Discharge Bathroom Shower/Tub: Tub/shower unit Discharge Bathroom Toilet: Handicapped height Discharge Bathroom Accessibility: Yes How Accessible: Accessible via walker, Accessible via wheelchair Does the patient have any problems obtaining your medications?: No   Social/Family/Support Systems Patient Roles: Spouse Contact Information: [phone] Anticipated Caregiver: Mary Sella Anticipated Caregiver's Contact Information: 24/7 Ability/Limitations of Caregiver: Min A Caregiver Availability: 24/7 Discharge Plan Discussed with Primary Caregiver: Yes Is Caregiver In Agreement  with Plan?: Yes Does Caregiver/Family have Issues with Lodging/Transportation while Pt is in Rehab?: No   Goals Patient/Family Goal for Rehab: PT/OT Supervision to Mod I Expected length of stay: 7-10 days Pt/Family Agrees to Admission and willing to participate: Yes Program Orientation Provided & Reviewed with Pt/Caregiver Including Roles  & Responsibilities: Yes   Decrease burden of Care through IP rehab admission: not anticipated   Possible need for SNF placement upon discharge: not anticipated   Patient Condition: I have reviewed medical records from [facility], spoken with CM, and patient and spouse. I met with patient at the bedside for inpatient rehabilitation assessment.  Patient will benefit from ongoing PT and OT, can actively participate in 3 hours of therapy a day 5 days of the week, and can make measurable gains during the admission.  Patient will also benefit from the coordinated team approach during an Inpatient Acute Rehabilitation admission.  The patient will receive intensive therapy as well as Rehabilitation physician, nursing, social worker, and care management interventions.  Due to safety, skin/wound care, disease management, medication administration, pain  management, and patient education the patient requires 24 hour a day rehabilitation nursing.  The patient is currently Min A  with mobility and basic ADLs.  Discharge setting and therapy post discharge at home with home health is anticipated.  Patient has agreed to participate in the Acute Inpatient Rehabilitation Program and will admit today.   Preadmission Screen Completed By:  Jeronimo Greaves, 06/26/2023 12:02 PM ______________________________________________________________________   Discussed status with Dr. Berline Chough  on 06/26/23 at 900 and received approval for admission today.   Admission Coordinator:  Jeronimo Greaves, CCC-SLP, time 1211/Date 06/26/23    Assessment/Plan: Diagnosis: R hip/Femoral f/x s/p nail- WBAT Does the need for close, 24 hr/day Medical supervision in concert with the patient's rehab needs make it unreasonable for this patient to be served in a less intensive setting? Yes Co-Morbidities requiring supervision/potential complications: recent R shoulder replacement, 05/10/23- RA, and IBS, post op pain Due to bladder management, bowel management, safety, skin/wound care, disease management, medication administration, pain management, and patient education, does the patient require 24 hr/day rehab nursing? Yes Does the patient require coordinated care of a physician, rehab nurse, PT, OT, and SLP to address physical and functional deficits in the context of the above medical diagnosis(es)? Yes Addressing deficits in the following areas: balance, endurance, locomotion, strength, transferring, bowel/bladder control, bathing, dressing, feeding, grooming, and toileting Can the patient actively participate in an intensive therapy program of at least 3 hrs of therapy 5 days a week? Yes The potential for patient to make measurable gains while on inpatient rehab is good Anticipated functional outcomes upon discharge from inpatient rehab: modified independent and supervision PT, modified  independent and supervision OT, n/a SLP Estimated rehab length of stay to reach the above functional goals is: 7-10 days  Anticipated discharge destination: Home 10. Overall Rehab/Functional Prognosis: good     MD Signature:

## 2023-06-26 NOTE — H&P (Signed)
 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 She had immediate onset of right thigh pain and was found to have externally rotated right hip due to displaced right peritrochanteric femur Fx. She was evaluated by Dr. Steward Drone and underwent Cephalomedullary  nailing of right femur. Post op WBAT and on Lovenox for DVT prophylaxis. Post op noted to have ABLA as well as boderline low BP. PT/OT has been working with patient who continues to be limited by weakness, right knee pain and flexed posture. She was independent PTA and currently requires min assist with ADLs and mobility. CIR recommended due to functional decline.   Independent and active PTA. She was still going for PT for right shoulder after arthroplasty. 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   LBM this AM- pain 0/10 at rest, but up to 8/10 with walking.  R knee has buckled and given out.   Review of Systems  Constitutional:  Negative for chills and fever.  HENT:  Negative for hearing loss.   Eyes:  Negative for blurred vision and double vision.  Respiratory:  Negative for cough and shortness of breath.   Cardiovascular:  Positive for leg swelling. Negative for chest pain and palpitations.  Gastrointestinal:  Positive for heartburn. Negative for constipation and nausea.  Genitourinary:  Negative for dysuria.  Musculoskeletal:  Positive for back pain, joint pain, myalgias and neck pain.  Neurological:  Positive for weakness. Negative for dizziness and headaches.  Psychiatric/Behavioral:  Negative for memory loss. The patient does not have insomnia.   All other systems reviewed and are negative.    Past Medical History:  Diagnosis Date   Arthritis    RA   Complication of anesthesia    GERD  (gastroesophageal reflux disease)    History of hiatal hernia    IBS (irritable bowel syndrome)    PONV (postoperative nausea and vomiting)    Rheumatoid arthritis (HCC)     Past Surgical History:  Procedure Laterality Date   ABDOMINAL HYSTERECTOMY     BICEPT TENODESIS Right 05/10/2023   Procedure: BICEPS TENODESIS;  Surgeon: Cammy Copa, MD;  Location: [facility] OR;  Service: Orthopedics;  Laterality: Right;   EYE SURGERY Bilateral    cataract   FINGER ARTHROPLASTY Right 03/23/2020   Procedure: Right index finger metacarpal phalangeal arthroplasty with repair as necessary and centralization of the extensor tendon and right middle finger proximal interphalangeal arthroplasty with repair as necessary;  Surgeon: Dominica Severin, MD;  Location: [facility];  Service: Orthopedics;  Laterality: Right;   FOOT SURGERY Left    HAND SURGERY Left    INTRAMEDULLARY (IM) NAIL INTERTROCHANTERIC Right 06/24/2023   Procedure: INTRAMEDULLARY (IM) NAIL INTERTROCHANTERIC;  Surgeon: Huel Cote, MD;  Location: [facility] OR;  Service: Orthopedics;  Laterality: Right;   ORIF SHOULDER FRACTURE Left    REVERSE SHOULDER ARTHROPLASTY Right 05/10/2023   Procedure: REVERSE SHOULDER ARTHROPLASTY;  Surgeon: Cammy Copa, MD;  Location: [facility] OR;  Service: Orthopedics;  Laterality: Right;   TONSILLECTOMY      Family History  Problem Relation Age of Onset   Rheumatic fever Mother     Social History: Married. 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  reports that she has never  smoked. She has never used smokeless tobacco. She reports current alcohol use of about 5.0 standard drinks of alcohol per week. She reports that she does not use drugs.   Allergies  Allergen Reactions   Oxycodone Swelling    Tolerates morphine   Penicillins Swelling    reddness   Sulfa Antibiotics     Medications Prior to Admission  Medication Sig Dispense Refill   acetaminophen  (TYLENOL) 500 MG tablet Take 1 tablet (500 mg total) by mouth every 6 (six) hours. 30 tablet 0   Ascorbic Acid (VITAMIN C GUMMIES PO) Take 2 each by mouth daily.     cetirizine (ZYRTEC) 10 MG tablet Take 10 mg by mouth daily as needed for allergies.     Cholecalciferol (VITAMIN D3 PO) Take 1 tablet by mouth daily.     folic acid (FOLVITE) 1 MG tablet Take 1 mg by mouth daily.     glucosamine-chondroitin 500-400 MG tablet Take 1 tablet by mouth daily.     naproxen (NAPROSYN) 500 MG tablet Take 500 mg by mouth daily as needed for mild pain (pain score 1-3).     sulfaSALAzine (AZULFIDINE) 500 MG tablet Take 1,500 mg by mouth daily.     Turmeric 500 MG CAPS Take 500 mg by mouth daily.     methocarbamol (ROBAXIN) 500 MG tablet Take 1 tablet (500 mg total) by mouth every 6 (six) hours as needed for muscle spasms. (Patient not taking: Reported on 06/24/2023) 30 tablet 0   [DISCONTINUED] HYDROcodone-acetaminophen (NORCO/VICODIN) 5-325 MG tablet Take 1-2 tablets by mouth every 4 (four) hours as needed for moderate pain (pain score 4-6) or severe pain (pain score 7-10). (Patient not taking: Reported on 06/24/2023) 30 tablet 0     Home: Home Living Family/patient expects to be discharged to:: Private residence Living Arrangements: Spouse/significant other Available Help at Discharge: Family, Available 24 hours/day Type of Home: House Home Access: Stairs to enter [employer] of Steps: 1 Entrance Stairs-Rails: None Home Layout: Two level, Able to live on main level with bedroom/bathroom Alternate Level Stairs-Number of Steps: 14 Alternate Level Stairs-Rails: Left Bathroom Shower/Tub: Tub/shower unit, Walk-in shower (Walk in most often) Bathroom Toilet: Handicapped height Bathroom Accessibility: Yes Home Equipment: Shower seat - built in, Higher education careers adviser held shower head, Grab bars - tub/shower, Agricultural consultant (2 wheels), BSC/3in1, Cane - single point   Functional History: Prior Function Prior Level  of Function : Independent/Modified Independent, Driving  Functional Status:  Mobility: Bed Mobility Overal bed mobility: Needs Assistance Bed Mobility: Supine to Sit Supine to sit: Min assist General bed mobility comments: Assist for RLE advancement towards EOB. Increased time and effort required however pt was able to elevate trunk to full sitting position and scoot out fully to EOB without assist. Transfers Overall transfer level: Needs assistance Equipment used: Rolling walker (2 wheels) Transfers: Sit to/from Stand Sit to Stand: Contact guard assist, Min assist General transfer comment: VC's for hand placement on seated surface for safety. Light assist to power up to full stand. Ambulation/Gait Ambulation/Gait assistance: Min assist Gait Distance (Feet): 40 Feet Assistive device: Rolling walker (2 wheels) Gait Pattern/deviations: Step-to pattern, Step-through pattern, Decreased stride length, Trunk flexed General Gait Details: VC's for improved posture, closer walker proximity and forward gaze. Occasional balance support required. Gait velocity: Decreased Gait velocity interpretation: <1.31 ft/sec, indicative of household ambulator    ADL: ADL Overall ADL's : Needs assistance/impaired Eating/Feeding: Independent, Bed level Grooming: Oral care, Wash/dry hands, Supervision/safety, Standing, Cueing for safety Upper Body  Bathing: Set up, Sitting Lower Body Bathing: Maximal assistance, Sit to/from stand Upper Body Dressing : Set up, Sitting Lower Body Dressing: Total assistance, Bed level Toilet Transfer: Minimal assistance, Cueing for sequencing, Cueing for safety, Ambulation, Comfort height toilet, BSC/3in1, Grab bars, Rolling walker (2 wheels) Toileting- Clothing Manipulation and Hygiene: Minimal assistance, Sit to/from stand  Cognition: Cognition Orientation Level: Oriented X4 Cognition Arousal: Alert Behavior During Therapy: WFL for tasks assessed/performed   Blood  pressure 106/63, pulse 87, temperature 98.8 F (37.1 C), temperature source Oral, resp. rate 17, height 5\' 3"  (1.6 m), weight 59 kg, SpO2 95%. Physical Exam Vitals and nursing note reviewed.  Constitutional:      Appearance: Normal appearance. She is normal weight.     Comments: Pt small woman; supine in bed; in dark when saw her; awake, alert, appropriate, NAD  HENT:     Head: Normocephalic and atraumatic.     Right Ear: External ear normal.     Left Ear: External ear normal.     Nose: Nose normal. No congestion.     Mouth/Throat:     Mouth: Mucous membranes are dry.     Pharynx: Oropharynx is clear. No oropharyngeal exudate.  Eyes:     General:        Right eye: No discharge.        Left eye: No discharge.     Extraocular Movements: Extraocular movements intact.  Cardiovascular:     Rate and Rhythm: Normal rate and regular rhythm.     Heart sounds: Normal heart sounds. No murmur heard.    No gallop.  Pulmonary:     Effort: Pulmonary effort is normal. No respiratory distress.     Breath sounds: Normal breath sounds. No wheezing, rhonchi or rales.  Abdominal:     General: Bowel sounds are normal. There is no distension.     Palpations: Abdomen is soft.     Tenderness: There is no abdominal tenderness.  Musculoskeletal:     Cervical back: Neck supple. No tenderness.     Comments: Edema right hip/right thigh. Multiple dressings right thigh to the knee.   UE strength 5/5 except Grip and FA 5-/5 B/L LLE 5/5 in HF, KE, KF, DF and PF RLE- HF and KE 2-/5 limited due to pain; distally 5/5  Skin:    General: Skin is warm and dry.     Comments: Dressings on R thigh laterally- moderate edema  Neurological:     Mental Status: She is alert and oriented to person, place, and time.     Comments: Intact to light touch in all 4 extremities   Psychiatric:        Mood and Affect: Mood normal.        Behavior: Behavior normal.     Results for orders placed or performed during the  hospital encounter of 06/24/23 (from the past 48 hours)  CBC     Status: Abnormal   Collection Time: 06/24/23  4:17 PM  Result Value Ref Range   WBC 8.2 4.0 - 10.5 K/uL   RBC 3.05 (L) 3.87 - 5.11 MIL/uL   Hemoglobin 8.6 (L) 12.0 - 15.0 g/dL   HCT 16.1 (L) 09.6 - 04.5 %   MCV 91.1 80.0 - 100.0 fL   MCH 28.2 26.0 - 34.0 pg   MCHC 30.9 30.0 - 36.0 g/dL   RDW 40.9 (H) 81.1 - 91.4 %   Platelets 176 150 - 400 K/uL   nRBC 0.0 0.0 - 0.2 %  Comment: Performed at [facility], [address], [place], [place] [address]  Comprehensive metabolic panel     Status: Abnormal   Collection Time: 06/24/23  4:17 PM  Result Value Ref Range   Sodium 137 135 - 145 mmol/L   Potassium 3.6 3.5 - 5.1 mmol/L   Chloride 104 98 - 111 mmol/L   CO2 24 22 - 32 mmol/L   Glucose, Bld 233 (H) 70 - 99 mg/dL    Comment: Glucose reference range applies only to samples taken after fasting for at least 8 hours.   BUN 16 8 - 23 mg/dL   Creatinine, Ser 6.04 0.44 - 1.00 mg/dL   Calcium 8.2 (L) 8.9 - 10.3 mg/dL   Total Protein 5.7 (L) 6.5 - 8.1 g/dL   Albumin 2.7 (L) 3.5 - 5.0 g/dL   AST 23 15 - 41 U/L   ALT 9 0 - 44 U/L   Alkaline Phosphatase 44 38 - 126 U/L   Total Bilirubin 0.4 0.0 - 1.2 mg/dL   GFR, Estimated >54 >09 mL/min    Comment: (NOTE) Calculated using the CKD-EPI Creatinine Equation (2021)    Anion gap 9 5 - 15    Comment: Performed at [facility], [address], [place], [place] [address]  Protime-INR     Status: None   Collection Time: 06/24/23  4:17 PM  Result Value Ref Range   Prothrombin Time 13.4 11.4 - 15.2 seconds   INR 1.0 0.8 - 1.2    Comment: (NOTE) INR goal varies based on device and disease states. Performed at [facility], [address] [place], [place] [address]   APTT     Status: None   Collection Time: 06/24/23  4:17 PM  Result Value Ref Range   aPTT 26 24 - 36 seconds    Comment: Performed at [facility], [address] [place], [place]  [address]  Basic metabolic panel     Status: Abnormal   Collection Time: 06/25/23  6:59 AM  Result Value Ref Range   Sodium 138 135 - 145 mmol/L   Potassium 3.8 3.5 - 5.1 mmol/L   Chloride 106 98 - 111 mmol/L   CO2 25 22 - 32 mmol/L   Glucose, Bld 84 70 - 99 mg/dL    Comment: Glucose reference range applies only to samples taken after fasting for at least 8 hours.   BUN 14 8 - 23 mg/dL   Creatinine, Ser 0.86 0.44 - 1.00 mg/dL   Calcium 8.6 (L) 8.9 - 10.3 mg/dL   GFR, Estimated >57 >84 mL/min    Comment: (NOTE) Calculated using the CKD-EPI Creatinine Equation (2021)    Anion gap 7 5 - 15    Comment: Performed at [facility], [address], [place], [place] [address]  APTT     Status: None   Collection Time: 06/25/23  6:59 AM  Result Value Ref Range   aPTT 32 24 - 36 seconds    Comment: Performed at [facility], [address] [place], [place] [address]  Protime-INR     Status: None   Collection Time: 06/25/23  6:59 AM  Result Value Ref Range   Prothrombin Time 13.5 11.4 - 15.2 seconds   INR 1.0 0.8 - 1.2    Comment: (NOTE) INR goal varies based on device and disease states. Performed at [facility], [address], [place], [place] [address]  Comprehensive metabolic panel     Status: Abnormal   Collection Time: 06/26/23  7:51 AM  Result Value Ref Range   Sodium 139 135 - 145 mmol/L   Potassium 3.5 3.5 - 5.1 mmol/L   Chloride 109 98 - 111 mmol/L   CO2 23 22 - 32 mmol/L   Glucose, Bld 149 (H) 70 - 99 mg/dL    Comment: Glucose reference range applies only to samples taken after fasting for at least 8 hours.   BUN 14 8 - 23 mg/dL   Creatinine, Ser 1.61 0.44 - 1.00 mg/dL   Calcium 8.1 (L) 8.9 - 10.3 mg/dL   Total Protein 6.1 (L) 6.5 - 8.1 g/dL   Albumin 2.9 (L) 3.5 - 5.0 g/dL   AST 26 15 - 41 U/L   ALT 10 0 - 44 U/L   Alkaline Phosphatase 47 38 - 126 U/L   Total Bilirubin 0.3 0.0 - 1.2 mg/dL   GFR, Estimated >09 >60 mL/min    Comment:  (NOTE) Calculated using the CKD-EPI Creatinine Equation (2021)    Anion gap 7 5 - 15    Comment: Performed at [facility], [address] [place], [place] [address]  CBC     Status: Abnormal   Collection Time: 06/26/23  7:51 AM  Result Value Ref Range   WBC 7.2 4.0 - 10.5 K/uL   RBC 2.98 (L) 3.87 - 5.11 MIL/uL   Hemoglobin 8.3 (L) 12.0 - 15.0 g/dL   HCT 81.1 (L) 91.4 - 78.2 %   MCV 91.3 80.0 - 100.0 fL   MCH 27.9 26.0 - 34.0 pg   MCHC 30.5 30.0 - 36.0 g/dL   RDW 95.6 (H) 21.3 - 08.6 %   Platelets 193 150 - 400 K/uL   nRBC 0.0 0.0 - 0.2 %    Comment: Performed at [facility], [address] [place], [place] [address]   No results found.    Blood pressure 106/63, pulse 87, temperature 98.8 F (37.1 C), temperature source Oral, resp. rate 17, height 5\' 3"  (1.6 m), weight 59 kg, SpO2 95%.  Medical Problem List and Plan: 1. Functional deficits secondary to L femoral fx s/p cephalomedullary nail- with WBAT  -patient may  shower- cover incision  -ELOS/Goals: 7-10 days mod I to supervision  Admit to CIR 2.  Antithrombotics: -DVT/anticoagulation:  Pharmaceutical: Lovenox  -antiplatelet therapy: ASA 81 mg prn mild for pain. .  3. Pain Management:  MSIR prn for severe pain and tramadol for moderate pain prn. ain.  4. Mood/Behavior/Sleep: LCSW to follow for evaluation and support.   -antipsychotic agents: N/A 5. Neuropsych/cognition: This patient is capable of making decisions on her own behalf. 6. Skin/Wound Care: Routine pressure relief measures monitor incision for healing.  7. Fluids/Electrolytes/Nutrition: Monitor I/O. Check CMET in am 8. Right femur Fx s/p IM nail: WBAT.  RA: Managed with sulfasalazine with folic acid daily. Followed by Dr. Alver Fisher  --d/c Naprosyn prn --was causing GI distress. Voltaren gel prn.   ABLA: Recheck CBC in am.        Jacquelynn Cree, PA-C 06/26/2023   I have personally performed a face to face diagnostic evaluation of this  patient and formulated the key components of the plan.  Additionally, I have personally reviewed laboratory data, imaging studies, as well as relevant notes and concur with the physician assistant's documentation above.   The patient's status has not changed from the original H&P.  Any changes in documentation  from the acute care chart have been noted above.

## 2023-06-26 NOTE — Progress Notes (Signed)
 Physical Therapy Treatment  Patient Details Name: Bailey Petersen MRN: [id number] DOB: 08/03/1940 Today's Date: 06/26/2023   History of Present Illness Pt is an 83 y.o. female who presents 06/24/2023 s/p fall at home, sustaining a R intertrochanteric hip fx. She is now s/p right hip cephulmedullary nail and is WBAT on the RLE. 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    PT Comments  Pt progressing towards physical therapy goals. Was able to perform transfers and ambulation with gross min assist to CGA and RW for support. Pt endorses increased pain on medial aspect of R knee. Ice pack placed after session with RLE propped on pillow for comfort. Will continue to follow and progress as able per POC.     If plan is discharge home, recommend the following: A little help with walking and/or transfers;A little help with bathing/dressing/bathroom;Assistance with cooking/housework;Assist for transportation;Help with stairs or ramp for entrance   Can travel by private vehicle        Equipment Recommendations  None recommended by PT (Pt needs to double check whether she has a walker)    Recommendations for Other Services Rehab consult     Precautions / Restrictions Precautions Precautions: Fall Recall of Precautions/Restrictions: Intact Restrictions Weight Bearing Restrictions Per Provider Order: Yes RLE Weight Bearing Per Provider Order: Weight bearing as tolerated     Mobility  Bed Mobility Overal bed mobility: Needs Assistance Bed Mobility: Supine to Sit     Supine to sit: Min assist     General bed mobility comments: Assist for RLE advancement towards EOB. Increased time and effort required however pt was able to elevate trunk to full sitting position and scoot out fully to EOB without assist.    Transfers Overall transfer level: Needs assistance Equipment used: Rolling walker (2  wheels) Transfers: Sit to/from Stand Sit to Stand: Contact guard assist, Min assist           General transfer comment: VC's for hand placement on seated surface for safety. Light assist to power up to full stand.    Ambulation/Gait Ambulation/Gait assistance: Min assist Gait Distance (Feet): 40 Feet Assistive device: Rolling walker (2 wheels) Gait Pattern/deviations: Step-to pattern, Step-through pattern, Decreased stride length, Trunk flexed Gait velocity: Decreased Gait velocity interpretation: <1.31 ft/sec, indicative of household ambulator   General Gait Details: VC's for improved posture, closer walker proximity and forward gaze. Occasional balance support required.   Stairs             Wheelchair Mobility     Tilt Bed    Modified Rankin (Stroke Patients Only)       Balance Overall balance assessment: Needs assistance Sitting-balance support: Feet supported, No upper extremity supported Sitting balance-Leahy Scale: Fair     Standing balance support: Bilateral upper extremity supported, During functional activity, Reliant on assistive device for balance Standing balance-Leahy Scale: Poor                              Communication Communication Communication: No apparent difficulties  Cognition Arousal: Alert Behavior During Therapy: WFL for tasks assessed/performed   PT - Cognitive impairments: No apparent impairments                         Following commands: Intact      Cueing Cueing Techniques: Verbal cues, Gestural cues  Exercises General Exercises - Lower Extremity Long Arc Quad: 20 reps Hip Flexion/Marching: 10 reps    General Comments        Pertinent Vitals/Pain Pain Assessment Pain Assessment: Faces Faces Pain Scale: Hurts little more Pain Location: R hip Pain Descriptors / Indicators: Operative site guarding, Sore Pain Intervention(s): Limited activity within patient's tolerance, Monitored during  session, Repositioned    Home Living Family/patient expects to be discharged to:: Private residence Living Arrangements: Spouse/significant other Available Help at Discharge: Family;Available 24 hours/day Type of Home: House Home Access: Stairs to enter Entrance Stairs-Rails: None Entrance Stairs-Number of Steps: 1 Alternate Level Stairs-Number of Steps: 14 Home Layout: Two level;Able to live on main level with bedroom/bathroom Home Equipment: Shower seat - built in;Hand held shower head;Grab bars - tub/shower;Rolling Environmental consultant (2 wheels);BSC/3in1;Cane - single point      Prior Function            PT Goals (current goals can now be found in the care plan section) Acute Rehab PT Goals Patient Stated Goal: Home at d/c, be able to walk down the aisle at her granddaughter's wedding PT Goal Formulation: With patient Time For Goal Achievement: 07/09/23 Potential to Achieve Goals: Good Progress towards PT goals: Progressing toward goals    Frequency    Min 1X/week      PT Plan      Co-evaluation              AM-PAC PT "6 Clicks" Mobility   Outcome Measure  Help needed turning from your back to your side while in a flat bed without using bedrails?: A Little Help needed moving from lying on your back to sitting on the side of a flat bed without using bedrails?: A Little Help needed moving to and from a bed to a chair (including a wheelchair)?: A Little Help needed standing up from a chair using your arms (e.g., wheelchair or bedside chair)?: A Little Help needed to walk in hospital room?: A Little Help needed climbing 3-5 steps with a railing? : A Lot 6 Click Score: 17    End of Session Equipment Utilized During Treatment: Gait belt Activity Tolerance: Patient tolerated treatment well Patient left: in chair;with call bell/phone within reach;with chair alarm set Nurse Communication: Mobility status PT Visit Diagnosis: Unsteadiness on feet (R26.81);Pain Pain -  Right/Left: Right Pain - part of body: Hip     Time: 1610-9604 PT Time Calculation (min) (ACUTE ONLY): 22 min  Charges:    $Gait Training: 8-22 mins PT General Charges $$ ACUTE PT VISIT: 1 Visit                     Conni Slipper, PT, DPT Acute Rehabilitation Services Secure Chat Preferred Office: [phone]    Marylynn Pearson 06/26/2023, 11:33 AM

## 2023-06-26 NOTE — Plan of Care (Signed)
  Problem: Consults Goal: RH GENERAL PATIENT EDUCATION Description: See Patient Education module for education specifics. Outcome: Progressing   Problem: RH BOWEL ELIMINATION Goal: RH STG MANAGE BOWEL WITH ASSISTANCE Description: STG Manage Bowel with toileting Assistance. Outcome: Progressing Goal: RH STG MANAGE BOWEL W/MEDICATION W/ASSISTANCE Description: STG Manage Bowel with Medication with mod I Assistance. Outcome: Progressing   Problem: RH SAFETY Goal: RH STG ADHERE TO SAFETY PRECAUTIONS W/ASSISTANCE/DEVICE Description: STG Adhere to Safety Precautions With cues Assistance/Device. Outcome: Progressing   Problem: RH PAIN MANAGEMENT Goal: RH STG PAIN MANAGED AT OR BELOW PT'S PAIN GOAL Description: Pain < 4 with prns Outcome: Progressing   Problem: RH KNOWLEDGE DEFICIT GENERAL Goal: RH STG INCREASE KNOWLEDGE OF SELF CARE AFTER HOSPITALIZATION Description: Patient and spouse will be able to manage care at discharge using educational resources independently Outcome: Progressing

## 2023-06-26 NOTE — PMR Pre-admission (Signed)
 PMR Admission Coordinator Pre-Admission Assessment  Patient: Bailey Petersen is an 83 y.o., female MRN: [id number] DOB: Sep 28, 1940 Height: 5\' 3"  (160 cm) Weight: 59 kg  Insurance Information HMO:     PPO:      PCP:      IPA:      80/20: yes     OTHER:  PRIMARY: Medicare A and B      Policy#: [id number]   Subscriber:  CM Name:       Phone#:      Fax#:  Pre-Cert#: verified Health and safety inspector:  Benefits:  Phone #:      Name:  Eff. Date: Part a 04/01/2007 and Part B 10/29/2009    Deduct: $1632      Out of Pocket Max: n/a      Life Max: n/a CIR: 100%      SNF: 20 full days Outpatient:      Co-Pay:  Home Health: 100%      Co-Pay:  DME:      Co-Pay:  Providers:  SECONDARY: [employer]      Policy#:      Phone#:   Artist:       Phone#:   The Data processing manager" for patients in Inpatient Rehabilitation Facilities with attached "Privacy Act Statement-Health Care Records" was provided and verbally reviewed with: Patient  Emergency Contact Information Contact Information     Name Relation Home Work Mobile   [place] Spouse [phone]  [phone]      Other Contacts   None on File     Current Medical History  Patient Admitting Diagnosis: Hip fracture  History of Present Illness: Pt is an 83 y.o. 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 Pt. Presented to the [facility] ED on 06/24/23 Patient was initially evaluated at an outside ER and  Found to have closed displaced intertrochanteric fracture of right femur . Patient was accepted to [facility] in consultation with orthopedic surgery on 06/24/23. She Undwerwent ephalomedullary nailing on 06/24/23. Pt. Was seen by PT/OT and they recommended CIR to assist return to PLOF.     [name] medical record from [facility] has been reviewed by the rehabilitation  admission coordinator and physician.  Past Medical History  Past Medical History:  Diagnosis Date   Arthritis    RA   Complication of anesthesia    GERD (gastroesophageal reflux disease)    History of hiatal hernia    IBS (irritable bowel syndrome)    PONV (postoperative nausea and vomiting)    Rheumatoid arthritis (HCC)     Has the patient had major surgery during 100 days prior to admission? Yes  Family History   family history includes Rheumatic fever in her mother.  Current Medications  Current Facility-Administered Medications:    acetaminophen (TYLENOL) tablet 650 mg, 650 mg, Oral, Q6H PRN, 650 mg at 06/25/23 0953 **OR** acetaminophen (TYLENOL) suppository 650 mg, 650 mg, Rectal, Q6H PRN, Nolberto Hanlon, MD   acetaminophen (TYLENOL) tablet 650 mg, 650 mg, Oral, Q4H, Nolberto Hanlon, MD, 650 mg at 06/26/23 0515   aspirin tablet 325 mg, 325 mg, Oral, Daily, Huel Cote, MD, 325 mg at 06/26/23 2841   celecoxib (CELEBREX) capsule 100 mg, 100 mg, Oral, BID PRN, Nolberto Hanlon, MD, 100 mg at 06/25/23 0951   cholecalciferol (VITAMIN D3) 25 MCG (1000  UNIT) tablet 1,000 Units, 1,000 Units, Oral, Daily, Huel Cote, MD, 1,000 Units at 06/26/23 0717   docusate sodium (COLACE) capsule 100 mg, 100 mg, Oral, BID, Huel Cote, MD, 100 mg at 06/26/23 0718   enoxaparin (LOVENOX) injection 40 mg, 40 mg, Subcutaneous, QHS, Nolberto Hanlon, MD, 40 mg at 06/25/23 2115   folic acid (FOLVITE) tablet 1 mg, 1 mg, Oral, Daily, Huel Cote, MD, 1 mg at 06/26/23 1610   morphine (PF) 2 MG/ML injection 2 mg, 2 mg, Intravenous, Q1H PRN, Nolberto Hanlon, MD   ondansetron (ZOFRAN) tablet 4 mg, 4 mg, Oral, Q6H PRN **OR** ondansetron (ZOFRAN) injection 4 mg, 4 mg, Intravenous, Q6H PRN, Huel Cote, MD   polyethylene glycol (MIRALAX / GLYCOLAX) packet 17 g, 17 g, Oral, Daily PRN, Nolberto Hanlon, MD   sodium chloride flush (NS) 0.9 % injection 3 mL, 3 mL, Intravenous, Q12H, Nolberto Hanlon, MD, 3 mL at 06/26/23 9604    sulfaSALAzine (AZULFIDINE) tablet 1,500 mg, 1,500 mg, Oral, Daily, Nolberto Hanlon, MD, 1,500 mg at 06/26/23 5409  Patients Current Diet:  Diet Order             Diet regular Room service appropriate? Yes; Fluid consistency: Thin  Diet effective now                   Precautions / Restrictions Precautions Precautions: Fall Restrictions Weight Bearing Restrictions Per Provider Order: Yes RLE Weight Bearing Per Provider Order: Weight bearing as tolerated   Has the patient had 2 or more falls or a fall with injury in the past year? Yes  Prior Activity Level Community (5-7x/wk): Pt. active in the community PTA  Prior Functional Level Self Care: Did the patient need help bathing, dressing, using the toilet or eating? Independent  Indoor Mobility: Did the patient need assistance with walking from room to room (with or without device)? Independent  Stairs: Did the patient need assistance with internal or external stairs (with or without device)? Independent  Functional Cognition: Did the patient need help planning regular tasks such as shopping or remembering to take medications? Independent  Patient Information Are you of Hispanic, Latino/a,or Spanish origin?: A. No, not of Hispanic, Latino/a, or Spanish origin What is your race?: A. White Do you need or want an interpreter to communicate with a doctor or health care staff?: 0. No  [name] Response To:  Health Literacy and Transportation Is the patient able to respond to health literacy and transportation needs?: Yes Health Literacy - How often do you need to have someone help you when you read instructions, pamphlets, or other written material from your doctor or pharmacy?: Never In the past 12 months, has lack of transportation kept you from medical appointments or from getting medications?: No In the past 12 months, has lack of transportation kept you from meetings, work, or from getting things needed for daily living?:  No  Journalist, newspaper / Equipment Home Equipment: Shower seat - built in, Higher education careers adviser held shower head, Grab bars - tub/shower, Agricultural consultant (2 wheels), BSC/3in1, Cane - single point  Prior Device Use: Indicate devices/aids used by the patient prior to current illness, exacerbation or injury? None of the above  Current Functional Level Cognition  Orientation Level: Oriented X4    Extremity Assessment (includes Sensation/Coordination)  Upper Extremity Assessment: Right hand dominant, Generalized weakness  Lower Extremity Assessment: Defer to PT evaluation RLE Deficits / Details: Acute pain, decreased strength and AROM consistent with pre-op diagnosis and subsequent surgery. RLE: Unable  to fully assess due to pain    ADLs  Overall ADL's : Needs assistance/impaired Eating/Feeding: Independent, Bed level Grooming: Oral care, Wash/dry hands, Supervision/safety, Standing, Cueing for safety Upper Body Bathing: Set up, Sitting Lower Body Bathing: Maximal assistance, Sit to/from stand Upper Body Dressing : Set up, Sitting Lower Body Dressing: Total assistance, Bed level Toilet Transfer: Minimal assistance, Cueing for sequencing, Cueing for safety, Ambulation, Comfort height toilet, BSC/3in1, Grab bars, Rolling walker (2 wheels) Toileting- Clothing Manipulation and Hygiene: Minimal assistance, Sit to/from stand    Mobility  Overal bed mobility: Needs Assistance Bed Mobility: Supine to Sit Supine to sit: Min assist General bed mobility comments: Assist for RLE advancement towards EOB. Increased time and effort required however pt was able to elevate trunk to full sitting position and scoot out fully to EOB without assist.    Transfers  Overall transfer level: Needs assistance Equipment used: Rolling walker (2 wheels) Transfers: Sit to/from Stand Sit to Stand: Contact guard assist, Min assist General transfer comment: VC's for hand placement on seated surface for safety. Light assist to  power up to full stand.    Ambulation / Gait / Stairs / Wheelchair Mobility  Ambulation/Gait Ambulation/Gait assistance: Editor, commissioning (Feet): 40 Feet Assistive device: Rolling walker (2 wheels) Gait Pattern/deviations: Step-to pattern, Step-through pattern, Decreased stride length, Trunk flexed General Gait Details: VC's for improved posture, closer walker proximity and forward gaze. Occasional balance support required. Gait velocity: Decreased Gait velocity interpretation: <1.31 ft/sec, indicative of household ambulator    Posture / Balance Balance Overall balance assessment: Needs assistance Sitting-balance support: Feet supported, No upper extremity supported Sitting balance-Leahy Scale: Fair Standing balance support: Bilateral upper extremity supported, During functional activity, Reliant on assistive device for balance Standing balance-Leahy Scale: Poor    Special needs/care consideration Special service needs none    Previous Home Environment (from acute therapy documentation) Living Arrangements: Spouse/significant other Available Help at Discharge: Family, Available 24 hours/day Type of Home: House Home Layout: Two level, Able to live on main level with bedroom/bathroom Alternate Level Stairs-Rails: Left Alternate Level Stairs-Number of Steps: 14 Home Access: Stairs to enter Entrance Stairs-Rails: None Entrance Stairs-Number of Steps: 1 Bathroom Shower/Tub: Tub/shower unit, Walk-in shower (Walk in most often) Bathroom Toilet: Handicapped height Bathroom Accessibility: Yes Home Care Services: No  Discharge Living Setting Plans for Discharge Living Setting: [name] home Type of Home at Discharge: House Discharge Home Layout: Two level, Able to live on main level with bedroom/bathroom Alternate Level Stairs-Rails: Left Alternate Level Stairs-Number of Steps: 14 Discharge Home Access: Stairs to enter Entrance Stairs-Rails: None Entrance Stairs-Number of  Steps: 1 Discharge Bathroom Shower/Tub: Tub/shower unit Discharge Bathroom Toilet: Handicapped height Discharge Bathroom Accessibility: Yes How Accessible: Accessible via walker, Accessible via wheelchair Does the patient have any problems obtaining your medications?: No  Social/Family/Support Systems Patient Roles: Spouse Contact Information: [phone] Anticipated Caregiver: Mary Sella Anticipated Caregiver's Contact Information: 24/7 Ability/Limitations of Caregiver: Min A Caregiver Availability: 24/7 Discharge Plan Discussed with Primary Caregiver: Yes Is Caregiver In Agreement with Plan?: Yes Does Caregiver/Family have Issues with Lodging/Transportation while Pt is in Rehab?: No  Goals Patient/Family Goal for Rehab: PT/OT Supervision to Mod I Expected length of stay: 7-10 days Pt/Family Agrees to Admission and willing to participate: Yes Program Orientation Provided & Reviewed with Pt/Caregiver Including Roles  & Responsibilities: Yes  Decrease burden of Care through IP rehab admission: not anticipated  Possible need for SNF placement upon discharge: not anticipated  Patient Condition: I have  reviewed medical records from [facility], spoken with CM, and patient and spouse. I met with patient at the bedside for inpatient rehabilitation assessment.  Patient will benefit from ongoing PT and OT, can actively participate in 3 hours of therapy a day 5 days of the week, and can make measurable gains during the admission.  Patient will also benefit from the coordinated team approach during an Inpatient Acute Rehabilitation admission.  The patient will receive intensive therapy as well as Rehabilitation physician, nursing, social worker, and care management interventions.  Due to safety, skin/wound care, disease management, medication administration, pain management, and patient education the patient requires 24 hour a day rehabilitation nursing.  The patient is currently  Min A  with mobility and basic ADLs.  Discharge setting and therapy post discharge at home with home health is anticipated.  Patient has agreed to participate in the Acute Inpatient Rehabilitation Program and will admit today.  Preadmission Screen Completed By:  Jeronimo Greaves, 06/26/2023 12:02 PM ______________________________________________________________________   Discussed status with Dr. Berline Chough  on 06/26/23 at 900 and received approval for admission today.  Admission Coordinator:  Jeronimo Greaves, CCC-SLP, time 1211/Date 06/26/23   Assessment/Plan: Diagnosis: R hip/Femoral f/x s/p nail- WBAT Does the need for close, 24 hr/day Medical supervision in concert with the patient's rehab needs make it unreasonable for this patient to be served in a less intensive setting? Yes Co-Morbidities requiring supervision/potential complications: recent R shoulder replacement, 05/10/23- RA, and IBS, post op pain Due to bladder management, bowel management, safety, skin/wound care, disease management, medication administration, pain management, and patient education, does the patient require 24 hr/day rehab nursing? Yes Does the patient require coordinated care of a physician, rehab nurse, PT, OT, and SLP to address physical and functional deficits in the context of the above medical diagnosis(es)? Yes Addressing deficits in the following areas: balance, endurance, locomotion, strength, transferring, bowel/bladder control, bathing, dressing, feeding, grooming, and toileting Can the patient actively participate in an intensive therapy program of at least 3 hrs of therapy 5 days a week? Yes The potential for patient to make measurable gains while on inpatient rehab is good Anticipated functional outcomes upon discharge from inpatient rehab: modified independent and supervision PT, modified independent and supervision OT, n/a SLP Estimated rehab length of stay to reach the above functional goals is: 7-10 days   Anticipated discharge destination: Home 10. Overall Rehab/Functional Prognosis: good   MD Signature:

## 2023-06-26 NOTE — Progress Notes (Signed)
 Inpatient Rehabilitation Admission Medication Review by a Pharmacist  A complete drug regimen review was completed for this patient to identify any potential clinically significant medication issues.  High Risk Drug Classes Is patient taking? Indication by Medication  Antipsychotic Yes, as an intravenous medication PRN Prochlorperazine (PO, PR or IV) - nausea  Anticoagulant Yes Enoxaparin - VTE prophylaxis  Antibiotic No   Opioid Yes PRN Morphine IR - severe pain PRN Tramadol - moderate pain  Antiplatelet Yes Aspirin 325 mg daily - VTE prophylaxis Aspirin 81 mg BID prn - mild pain  Hypoglycemics/insulin No   Vasoactive Medication No   Chemotherapy No   Other Yes Acetaminophen (scheduled) - pain control Diclofenac gel - topical pain relief Sulfasalazine - rheumatoid arthritis Vitamin D, folate - supplements Docusate - laxative  PRNs: Maalox -indigestion Diphenhydramine - itching Guaifenesin-dextromethorphan - cough Melatonin - sleep Fleets enema - constipation     Type of Medication Issue Identified Description of Issue Recommendation(s)  Drug Interaction(s) (clinically significant)     Duplicate Therapy  On Enoxaparin for VTE prophylaxis. Aspirin 325 mg daily added post-op for same (with SCDs and ambulation). Consider stopping Aspirin 325 mg daily.  Allergy     No Medication Administration End Date     Incorrect Dose     Additional Drug Therapy Needed     Significant med changes from prior encounter (inform family/care partners about these prior to discharge). New Aspirin 325 mg, Diclofenac gel.   Other  DC summary indicates plan to resume Vitamin C, prn Cetirizine, Methocarbamol (reported not taking), turmeric and Glucosamine-chondroitin. Consider resuming prn medications if clinically warranted while in CIR, or resume at discharge. Resume at supplements at discharge. Herbal supplements typically held while in the hospital.    Clinically significant medication  issues were identified that warrant physician communication and completion of prescribed/recommended actions by midnight of the next day:  No  Pharmacist comments:  - Could consider adding GI prophylaxis since taking Sulfasalazine daily for RA, and Aspirin 325 mg daily and 81 mg BID prn mild pain added.  If Aspirin 325 mg daily discontinued, could monitor PRN Aspirin use and add GI prophylaxis if using frequently.  Time spent performing this drug regimen review (minutes):  20   Dennie Fetters, [place] 06/26/2023 4:36 PM

## 2023-06-26 NOTE — H&P (Signed)
 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 She had immediate onset of right thigh pain and was found to have externally rotated right hip due to displaced right peritrochanteric femur Fx. She was evaluated by Dr. Steward Drone and underwent Cephalomedullary  nailing of right femur. Post op WBAT and on Lovenox for DVT prophylaxis. Post op noted to have ABLA as well as boderline low BP. PT/OT has been working with Bailey Petersen who continues to be limited by weakness, right knee pain and flexed posture. She was independent PTA and currently requires min assist with ADLs and mobility. CIR recommended due to functional decline.    Independent and active PTA. She was still going for PT for right shoulder after arthroplasty. 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    LBM this AM- pain 0/10 at rest, but up to 8/10 with walking.  R knee has buckled and given out.    Review of Systems  Constitutional:  Negative for chills and fever.  HENT:  Negative for hearing loss.   Eyes:  Negative for blurred vision and double vision.  Respiratory:  Negative for cough and shortness of breath.   Cardiovascular:  Positive for leg swelling. Negative for chest pain and palpitations.  Gastrointestinal:  Positive for heartburn. Negative for constipation and nausea.  Genitourinary:  Negative for dysuria.  Musculoskeletal:  Positive for back pain, joint pain, myalgias and neck pain.  Neurological:  Positive for weakness. Negative for dizziness and headaches.  Psychiatric/Behavioral:  Negative for memory loss. The Bailey Petersen does not have insomnia.   All other systems reviewed and are negative.           Past Medical History:  Diagnosis Date   Arthritis      RA   Complication of anesthesia      GERD (gastroesophageal reflux disease)     History of hiatal hernia     IBS (irritable bowel syndrome)     PONV (postoperative nausea and vomiting)     Rheumatoid arthritis (HCC)                 Past Surgical History:  Procedure Laterality Date   ABDOMINAL HYSTERECTOMY       BICEPT TENODESIS Right 05/10/2023    Procedure: BICEPS TENODESIS;  Surgeon: Cammy Copa, MD;  Location: [facility] OR;  Service: Orthopedics;  Laterality: Right;   EYE SURGERY Bilateral      cataract   FINGER ARTHROPLASTY Right 03/23/2020    Procedure: Right index finger metacarpal phalangeal arthroplasty with repair as necessary and centralization of the extensor tendon and right middle finger proximal interphalangeal arthroplasty with repair as necessary;  Surgeon: Dominica Severin, MD;  Location:  SURGERY CENTER;  Service: Orthopedics;  Laterality: Right;   FOOT SURGERY Left     HAND SURGERY Left     INTRAMEDULLARY (IM) NAIL INTERTROCHANTERIC Right 06/24/2023    Procedure: INTRAMEDULLARY (IM) NAIL INTERTROCHANTERIC;  Surgeon: Huel Cote, MD;  Location: [facility] OR;  Service: Orthopedics;  Laterality: Right;   ORIF SHOULDER FRACTURE Left     REVERSE SHOULDER ARTHROPLASTY Right 05/10/2023    Procedure: REVERSE SHOULDER ARTHROPLASTY;  Surgeon: Cammy Copa, MD;  Location: [facility] OR;  Service: Orthopedics;  Laterality: Right;   TONSILLECTOMY  Family History  Problem Relation Age of Onset   Rheumatic fever Mother            Social History: Married. 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  reports that she has never smoked. She has never used smokeless tobacco. She reports current alcohol use of about 5.0 standard drinks of alcohol per week. She reports that she does not use drugs.          Allergies  Allergen Reactions   Oxycodone Swelling      Tolerates morphine   Penicillins Swelling      reddness   Sulfa Antibiotics               Medications Prior to Admission  Medication Sig Dispense Refill   acetaminophen (TYLENOL) 500 MG tablet Take 1 tablet (500 mg total) by mouth every 6 (six) hours. 30 tablet 0   Ascorbic Acid (VITAMIN C GUMMIES PO) Take 2 each by mouth daily.       cetirizine (ZYRTEC) 10 MG tablet Take 10 mg by mouth daily as needed for allergies.       Cholecalciferol (VITAMIN D3 PO) Take 1 tablet by mouth daily.       folic acid (FOLVITE) 1 MG tablet Take 1 mg by mouth daily.       glucosamine-chondroitin 500-400 MG tablet Take 1 tablet by mouth daily.       naproxen (NAPROSYN) 500 MG tablet Take 500 mg by mouth daily as needed for mild pain (pain score 1-3).       sulfaSALAzine (AZULFIDINE) 500 MG tablet Take 1,500 mg by mouth daily.       Turmeric 500 MG CAPS Take 500 mg by mouth daily.       methocarbamol (ROBAXIN) 500 MG tablet Take 1 tablet (500 mg total) by mouth every 6 (six) hours as needed for muscle spasms. (Bailey Petersen not taking: Reported on 06/24/2023) 30 tablet 0   [DISCONTINUED] HYDROcodone-acetaminophen (NORCO/VICODIN) 5-325 MG tablet Take 1-2 tablets by mouth every 4 (four) hours as needed for moderate pain (pain score 4-6) or severe pain (pain score 7-10). (Bailey Petersen not taking: Reported on 06/24/2023) 30 tablet 0            Home: Home Living Family/Bailey Petersen expects to be discharged to:: Private residence Living Arrangements: Spouse/significant other Available Help at Discharge: Family, Available 24 hours/day Type of Home: House Home Access: Stairs to enter [employer] of Steps: 1 Entrance Stairs-Rails: None Home Layout: Two level, Able to live on main level with bedroom/bathroom Alternate Level Stairs-Number of Steps: 14 Alternate Level Stairs-Rails: Left Bathroom Shower/Tub: Tub/shower unit, Walk-in shower (Walk in most often) Bathroom Toilet: Handicapped height Bathroom Accessibility: Yes Home Equipment: Shower seat - built in, Higher education careers adviser held shower head, Grab bars - tub/shower,  Agricultural consultant (2 wheels), BSC/3in1, Cane - single point   Functional History: Prior Function Prior Level of Function : Independent/Modified Independent, Driving   Functional Status:  Mobility: Bed Mobility Overal bed mobility: Needs Assistance Bed Mobility: Supine to Sit Supine to sit: Min assist General bed mobility comments: Assist for RLE advancement towards EOB. Increased time and effort required however pt was able to elevate trunk to full sitting position and scoot out fully to EOB without assist. Transfers Overall transfer level: Needs assistance Equipment used: Rolling walker (2 wheels) Transfers: Sit to/from Stand Sit to Stand: Contact guard assist, Min assist General transfer comment: VC's for hand placement on seated surface for safety.  Light assist to power up to full stand. Ambulation/Gait Ambulation/Gait assistance: Min assist Gait Distance (Feet): 40 Feet Assistive device: Rolling walker (2 wheels) Gait Pattern/deviations: Step-to pattern, Step-through pattern, Decreased stride length, Trunk flexed General Gait Details: VC's for improved posture, closer walker proximity and forward gaze. Occasional balance support required. Gait velocity: Decreased Gait velocity interpretation: <1.31 ft/sec, indicative of household ambulator   ADL: ADL Overall ADL's : Needs assistance/impaired Eating/Feeding: Independent, Bed level Grooming: Oral care, Wash/dry hands, Supervision/safety, Standing, Cueing for safety Upper Body Bathing: Set up, Sitting Lower Body Bathing: Maximal assistance, Sit to/from stand Upper Body Dressing : Set up, Sitting Lower Body Dressing: Total assistance, Bed level Toilet Transfer: Minimal assistance, Cueing for sequencing, Cueing for safety, Ambulation, Comfort height toilet, BSC/3in1, Grab bars, Rolling walker (2 wheels) Toileting- Clothing Manipulation and Hygiene: Minimal assistance, Sit to/from stand   Cognition: Cognition Orientation Level:  Oriented X4 Cognition Arousal: Alert Behavior During Therapy: WFL for tasks assessed/performed     Blood pressure 106/63, pulse 87, temperature 98.8 F (37.1 C), temperature source Oral, resp. rate 17, height 5\' 3"  (1.6 m), weight 59 kg, SpO2 95%. Physical Exam Vitals and nursing note reviewed.  Constitutional:      Appearance: Normal appearance. She is normal weight.     Comments: Pt small woman; supine in bed; in dark when saw her; awake, alert, appropriate, NAD  HENT:     Head: Normocephalic and atraumatic.     Right Ear: External ear normal.     Left Ear: External ear normal.     Nose: Nose normal. No congestion.     Mouth/Throat:     Mouth: Mucous membranes are dry.     Pharynx: Oropharynx is clear. No oropharyngeal exudate.  Eyes:     General:        Right eye: No discharge.        Left eye: No discharge.     Extraocular Movements: Extraocular movements intact.  Cardiovascular:     Rate and Rhythm: Normal rate and regular rhythm.     Heart sounds: Normal heart sounds. No murmur heard.    No gallop.  Pulmonary:     Effort: Pulmonary effort is normal. No respiratory distress.     Breath sounds: Normal breath sounds. No wheezing, rhonchi or rales.  Abdominal:     General: Bowel sounds are normal. There is no distension.     Palpations: Abdomen is soft.     Tenderness: There is no abdominal tenderness.  Musculoskeletal:     Cervical back: Neck supple. No tenderness.     Comments: Edema right hip/right thigh. Multiple dressings right thigh to the knee.    UE strength 5/5 except Grip and FA 5-/5 B/L LLE 5/5 in HF, KE, KF, DF and PF RLE- HF and KE 2-/5 limited due to pain; distally 5/5  Skin:    General: Skin is warm and dry.     Comments: Dressings on R thigh laterally- moderate edema  Neurological:     Mental Status: She is alert and oriented to person, place, and time.     Comments: Intact to light touch in all 4 extremities   Psychiatric:        Mood and Affect:  Mood normal.        Behavior: Behavior normal.        Lab Results Last 48 Hours        Results for orders placed or performed during the hospital encounter of 06/24/23 (from  the past 48 hours)  CBC     Status: Abnormal    Collection Time: 06/24/23  4:17 PM  Result Value Ref Range    WBC 8.2 4.0 - 10.5 K/uL    RBC 3.05 (L) 3.87 - 5.11 MIL/uL    Hemoglobin 8.6 (L) 12.0 - 15.0 g/dL    HCT 16.1 (L) 09.6 - 46.0 %    MCV 91.1 80.0 - 100.0 fL    MCH 28.2 26.0 - 34.0 pg    MCHC 30.9 30.0 - 36.0 g/dL    RDW 04.5 (H) 40.9 - 15.5 %    Platelets 176 150 - 400 K/uL    nRBC 0.0 0.0 - 0.2 %      Comment: Performed at [facility], [address], [place], [place] [address]  Comprehensive metabolic panel     Status: Abnormal    Collection Time: 06/24/23  4:17 PM  Result Value Ref Range    Sodium 137 135 - 145 mmol/L    Potassium 3.6 3.5 - 5.1 mmol/L    Chloride 104 98 - 111 mmol/L    CO2 24 22 - 32 mmol/L    Glucose, Bld 233 (H) 70 - 99 mg/dL      Comment: Glucose reference range applies only to samples taken after fasting for at least 8 hours.    BUN 16 8 - 23 mg/dL    Creatinine, Ser 4.78 0.44 - 1.00 mg/dL    Calcium 8.2 (L) 8.9 - 10.3 mg/dL    Total Protein 5.7 (L) 6.5 - 8.1 g/dL    Albumin 2.7 (L) 3.5 - 5.0 g/dL    AST 23 15 - 41 U/L    ALT 9 0 - 44 U/L    Alkaline Phosphatase 44 38 - 126 U/L    Total Bilirubin 0.4 0.0 - 1.2 mg/dL    GFR, Estimated >29 >56 mL/min      Comment: (NOTE) Calculated using the CKD-EPI Creatinine Equation (2021)      Anion gap 9 5 - 15      Comment: Performed at [facility], [address] [place], [place] [address]  Protime-INR     Status: None    Collection Time: 06/24/23  4:17 PM  Result Value Ref Range    Prothrombin Time 13.4 11.4 - 15.2 seconds    INR 1.0 0.8 - 1.2      Comment: (NOTE) INR goal varies based on device and disease states. Performed at [facility], [address] [place], [place] [address]    APTT      Status: None    Collection Time: 06/24/23  4:17 PM  Result Value Ref Range    aPTT 26 24 - 36 seconds      Comment: Performed at [facility], [address], [place], [place] [address]  Basic metabolic panel     Status: Abnormal    Collection Time: 06/25/23  6:59 AM  Result Value Ref Range    Sodium 138 135 - 145 mmol/L    Potassium 3.8 3.5 - 5.1 mmol/L    Chloride 106 98 - 111 mmol/L    CO2 25 22 - 32 mmol/L    Glucose, Bld 84 70 - 99 mg/dL      Comment: Glucose reference range applies only to samples taken after fasting for at least 8 hours.    BUN 14 8 - 23 mg/dL    Creatinine, Ser 5.28 0.44 -  1.00 mg/dL    Calcium 8.6 (L) 8.9 - 10.3 mg/dL    GFR, Estimated >16 >10 mL/min      Comment: (NOTE) Calculated using the CKD-EPI Creatinine Equation (2021)      Anion gap 7 5 - 15      Comment: Performed at [facility], [address], [place], [place] [address]  APTT     Status: None    Collection Time: 06/25/23  6:59 AM  Result Value Ref Range    aPTT 32 24 - 36 seconds      Comment: Performed at [facility], [address], [place], [place] [address]  Protime-INR     Status: None    Collection Time: 06/25/23  6:59 AM  Result Value Ref Range    Prothrombin Time 13.5 11.4 - 15.2 seconds    INR 1.0 0.8 - 1.2      Comment: (NOTE) INR goal varies based on device and disease states. Performed at [facility], [address] [place], [place] [address]    Comprehensive metabolic panel     Status: Abnormal    Collection Time: 06/26/23  7:51 AM  Result Value Ref Range    Sodium 139 135 - 145 mmol/L    Potassium 3.5 3.5 - 5.1 mmol/L    Chloride 109 98 - 111 mmol/L    CO2 23 22 - 32 mmol/L    Glucose, Bld 149 (H) 70 - 99 mg/dL      Comment: Glucose reference range applies only to samples taken after fasting for at least 8 hours.    BUN 14 8 - 23 mg/dL    Creatinine, Ser 8.29 0.44 - 1.00 mg/dL    Calcium 8.1 (L) 8.9 - 10.3 mg/dL    Total Protein 6.1 (L)  6.5 - 8.1 g/dL    Albumin 2.9 (L) 3.5 - 5.0 g/dL    AST 26 15 - 41 U/L    ALT 10 0 - 44 U/L    Alkaline Phosphatase 47 38 - 126 U/L    Total Bilirubin 0.3 0.0 - 1.2 mg/dL    GFR, Estimated >56 >21 mL/min      Comment: (NOTE) Calculated using the CKD-EPI Creatinine Equation (2021)      Anion gap 7 5 - 15      Comment: Performed at [facility], [address] [place], [place] [address]  CBC     Status: Abnormal    Collection Time: 06/26/23  7:51 AM  Result Value Ref Range    WBC 7.2 4.0 - 10.5 K/uL    RBC 2.98 (L) 3.87 - 5.11 MIL/uL    Hemoglobin 8.3 (L) 12.0 - 15.0 g/dL    HCT 78.4 (L) 69.6 - 46.0 %    MCV 91.3 80.0 - 100.0 fL    MCH 27.9 26.0 - 34.0 pg    MCHC 30.5 30.0 - 36.0 g/dL    RDW 29.5 (H) 28.4 - 15.5 %    Platelets 193 150 - 400 K/uL    nRBC 0.0 0.0 - 0.2 %      Comment: Performed at [facility], [address] [place], [place] [address]      Imaging Results (Last 48 hours)  No results found.         Blood pressure 106/63, pulse 87, temperature 98.8 F (37.1 C), temperature source Oral, resp. rate 17, height 5\' 3"  (1.6 m), weight 59 kg, SpO2  95%.   Medical Problem List and Plan: 1. Functional deficits secondary to R femoral fx s/p cephalomedullary nail- with WBAT             -Bailey Petersen may  shower- cover incision             -ELOS/Goals: 7-10 days mod I to supervision             Admit to CIR 2.  Antithrombotics: -DVT/anticoagulation:  Pharmaceutical: Lovenox             -antiplatelet therapy: ASA 81 mg prn mild for pain. .  3. Pain Management:  MSIR prn for severe pain and tramadol for moderate pain prn. ain.  4. Mood/Behavior/Sleep: LCSW to follow for evaluation and support.              -antipsychotic agents: N/A 5. Neuropsych/cognition: This Bailey Petersen is capable of making decisions on her own behalf. 6. Skin/Wound Care: Routine pressure relief measures monitor incision for healing.  7. Fluids/Electrolytes/Nutrition: Monitor I/O. Check CMET in  am 8. Right femur Fx s/p IM nail: WBAT.  RA: Managed with sulfasalazine with folic acid daily. Followed by Dr. Alver Fisher             --d/c Naprosyn prn --was causing GI distress. Voltaren gel prn.   ABLA: Recheck CBC in am.              Jacquelynn Cree, PA-C 06/26/2023     I have personally performed a face to face diagnostic evaluation of this Bailey Petersen and formulated the key components of the plan.  Additionally, I have personally reviewed laboratory data, imaging studies, as well as relevant notes and concur with the physician assistant's documentation above.   The Bailey Petersen's status has not changed from the original H&P.  Any changes in documentation from the acute care chart have been noted above.

## 2023-06-26 NOTE — Discharge Summary (Signed)
 Physician Discharge Summary   Patient: Bailey Petersen MRN: [id number] DOB: March 22, 1941  Admit date:     06/24/2023  Discharge date: 06/26/23  Discharge Physician: Rickey Barbara   PCP: Lorelei Pont, DO   Recommendations at discharge:    Follow up with PCP in 3-4 weeks Follow up with Dr. Steward Drone as scheduled after discharge  Discharge Diagnoses: Principal Problem:   Closed displaced intertrochanteric fracture of right femur [facility]) Active Problems:   Right hip pain   Rheumatoid arthritis (HCC)  Resolved Problems:   * No resolved hospital problems. *  Hospital Course: 83 y.o. female with medical history significant of right shoulder total arthroplasty several months prior.  Patient was in her usual state of health till yesterday when she was standing on a stool to do some errands at home.  Unfortunately she lost balance and fell down on her right lower extremity followed by severe pain in the right thigh.  Patient was initially evaluated at an outside ER.  Found to have right femoral fracture.  Patient was accepted to [facility] in consultation with orthopedic surgery.  Patient actually arrived to [facility] earlier this morning.  Taken straight to the OR by orthopedic surgery.  At this time patient is s/p Right hip cephulmedullary nail   Assessment and Plan: Closed displaced intertrochanteric fracture of right femur [facility]) -Orthopedic Surgery following, femur xray reviewed Secondary to mechanical fall.  Patient is s/p cephalomedullary nailing on 2/23.  -Therapy recs for CIR -continue analgesia as needed -Orthopedic Surgery recommends for DVT proph: SCDs, ambulation, aspirin 325 daily    Rheumatoid arthritis (HCC) -Cont with salfasalazine. -seems stable at this time    Consultants: Orthopedic Surgery Procedures performed: cephalomedullary nailing on 2/23  Disposition: Rehabilitation facility Diet recommendation:  Regular diet DISCHARGE MEDICATION: Allergies as of  06/26/2023       Reactions   Oxycodone Swelling   Tolerates morphine   Penicillins Swelling   reddness   Sulfa Antibiotics         Medication List     TAKE these medications    acetaminophen 500 MG tablet Commonly known as: TYLENOL Take 1 tablet (500 mg total) by mouth every 6 (six) hours.   aspirin 325 MG tablet Take 1 tablet (325 mg total) by mouth daily. Start taking on: June 27, 2023   cetirizine 10 MG tablet Commonly known as: ZYRTEC Take 10 mg by mouth daily as needed for allergies.   folic acid 1 MG tablet Commonly known as: FOLVITE Take 1 mg by mouth daily.   glucosamine-chondroitin 500-400 MG tablet Take 1 tablet by mouth daily.   HYDROcodone-acetaminophen 5-325 MG tablet Commonly known as: NORCO/VICODIN Take 1-2 tablets by mouth every 4 (four) hours as needed for moderate pain (pain score 4-6) or severe pain (pain score 7-10).   methocarbamol 500 MG tablet Commonly known as: ROBAXIN Take 1 tablet (500 mg total) by mouth every 6 (six) hours as needed for muscle spasms.   naproxen 500 MG tablet Commonly known as: NAPROSYN Take 500 mg by mouth daily as needed for mild pain (pain score 1-3).   sulfaSALAzine 500 MG tablet Commonly known as: AZULFIDINE Take 1,500 mg by mouth daily.   Turmeric 500 MG Caps Take 500 mg by mouth daily.   VITAMIN C GUMMIES PO Take 2 each by mouth daily.   VITAMIN D3 PO Take 1 tablet by mouth daily.        Follow-up Information     Huel Cote, MD  Follow up.   Specialty: Orthopedic Surgery Contact information: [address] [place] [address] [phone]         Lorelei Pont, DO Follow up in 4 week(s).   Specialty: Family Medicine Why: Hospital follow up Contact information: [address] [place] [address] [phone]                Discharge Exam: Filed Weights   06/24/23 0728  Weight: 59 kg   General exam: Awake, laying in bed, in nad Respiratory  system: Normal respiratory effort, no wheezing Cardiovascular system: regular rate, s1, s2 Gastrointestinal system: Soft, nondistended, positive BS Central nervous system: CN2-12 grossly intact, strength intact Extremities: Perfused, no clubbing Skin: Normal skin turgor, no notable skin lesions seen Psychiatry: Mood normal // no visual hallucinations   Condition at discharge: fair  The results of significant diagnostics from this hospitalization (including imaging, microbiology, ancillary and laboratory) are listed below for reference.   Imaging Studies: DG FEMUR, MIN 2 VIEWS RIGHT Result Date: 06/24/2023 CLINICAL DATA:  [id number] Surgery, elective [id number] EXAM: RIGHT FEMUR 2 VIEWS COMPARISON:  X-ray 06/24/2023 FINDINGS: Eleven C-arm fluoroscopic images were obtained intraoperatively and submitted for post operative interpretation. Images demonstrate placement of ORIF hardware traversing proximal right femur fracture with improved alignment. 142 seconds of fluoroscopy time utilized. Radiation dose: 11.98 mGy. Please see the performing provider's procedural report for further detail. IMPRESSION: Intraoperative fluoroscopic guidance for proximal right femur ORIF. Electronically Signed   By: Duanne Guess D.O.   On: 06/24/2023 13:13   DG C-Arm 1-60 Min-No Report Result Date: 06/24/2023 Fluoroscopy was utilized by the requesting physician.  No radiographic interpretation.   DG Pelvis 1-2 Views Result Date: 06/24/2023 CLINICAL DATA:  Hip pain EXAM: PELVIS - 1-2 VIEW COMPARISON:  None Available. FINDINGS: Bones are diffusely demineralized. Comminuted fracture of the proximal femur noted with varus angulation. SI joints and symphysis pubis unremarkable. IMPRESSION: Comminuted fracture of the proximal femur with varus angulation. Electronically Signed   By: Kennith Center M.D.   On: 06/24/2023 07:48   US Guided Needle Placement - No Linked Charges Result Date: 06/13/2023 Ultrasound imaging demonstrates  needle placement into the glenohumeral joint with injection of fluid and no complication.   XR Shoulder Left Result Date: 06/13/2023 AP, Scap Y, axillary views of left shoulder reviewed.  Mild joint space narrowing noted of the glenohumeral joint with inferior humeral head osteophyte and osteophytic lipping of the glenoid noted.  No fracture or dislocation.  There is hardware that is present from prior ORIF with no significant evidence of loosening of hardware.   Microbiology: Results for orders placed or performed during the hospital encounter of 06/24/23  Surgical pcr screen     Status: None   Collection Time: 06/24/23  7:24 AM   Specimen: Nasal Mucosa; Nasal Swab  Result Value Ref Range Status   MRSA, PCR NEGATIVE NEGATIVE Final   Staphylococcus aureus NEGATIVE NEGATIVE Final    Comment: (NOTE) The Xpert SA Assay (FDA approved for NASAL specimens in patients 30 years of age and older), is one component of a comprehensive surveillance program. It is not intended to diagnose infection nor to guide or monitor treatment. Performed at [facility], [address], [place], [place] [address]     Labs: CBC: Recent Labs  Lab 06/24/23 1617 06/26/23 0751  WBC 8.2 7.2  HGB 8.6* 8.3*  HCT 27.8* 27.2*  MCV 91.1 91.3  PLT 176 193   Basic Metabolic Panel: Recent  Labs  Lab 06/24/23 1617 06/25/23 0659 06/26/23 0751  NA 137 138 139  K 3.6 3.8 3.5  CL 104 106 109  CO2 24 25 23   GLUCOSE 233* 84 149*  BUN 16 14 14   CREATININE 0.78 0.94 0.72  CALCIUM 8.2* 8.6* 8.1*   Liver Function Tests: Recent Labs  Lab 06/24/23 1617 06/26/23 0751  AST 23 26  ALT 9 10  ALKPHOS 44 47  BILITOT 0.4 0.3  PROT 5.7* 6.1*  ALBUMIN 2.7* 2.9*   CBG: No results for input(s): "GLUCAP" in the last 168 hours.  Discharge time spent: less than 30 minutes.  Signed: Rickey Barbara, MD [facility] 06/26/2023

## 2023-06-26 NOTE — Progress Notes (Signed)
 Patient ID: Bailey Petersen, female   DOB: 1940/05/09, 83 y.o.   MRN: [id number] Met with the patient to review current medical situation post right total shoulder with new right hip fracture; WBAT. Reviewed medications and dietary supplements. Patient is anxious to get started with rehab; reports has been up walking QID since OR.  Pain managed with current regimen. Continue to follow along to address educational needs to facilitate preparation for discharge. Pamelia Hoit, RN

## 2023-06-26 NOTE — Progress Notes (Signed)
   Subjective:  Patient reports pain as mild.  Tolerating diet, voiding.  Pending mobilization with physical therapy  Objective:   VITALS:   Vitals:   06/25/23 1541 06/25/23 2035 06/26/23 0400 06/26/23 0827  BP: (!) 114/59 (!) 109/58 114/65 106/63  Pulse: (!) 102 79 86 87  Resp: 17 14 14 17   Temp: 98.2 F (36.8 C) 98.3 F (36.8 C) 97.9 F (36.6 C) 98.8 F (37.1 C)  TempSrc: Oral Oral Oral Oral  SpO2: 100% 95% 92% 95%  Weight:      Height:       Right hip dressings are clean dry and intact.  Sensation is intact to light touch throughout.  Able to flex at the right hip.  Fires tibialis anterior and gastrocsoleus.  2+ dorsalis pedis pulse  Lab Results  Component Value Date   WBC 7.2 06/26/2023   HGB 8.3 (L) 06/26/2023   HCT 27.2 (L) 06/26/2023   MCV 91.3 06/26/2023   PLT 193 06/26/2023     Assessment/Plan:  2 Days Post-Op status post right hip cephalomedullary nailing doing well.    - Patient to work with PT to optimize mobilization safely - DVT ppx - SCDs, ambulation, aspirin 325 daily - WBAT operative extremity - Pain control - multimodal pain management, ATC acetaminophen in conjunction with as needed narcotic (oxycodone), although this should be minimized with other modalities  - Discharge planning pending CM, appreciate coordination     Bailey Petersen 06/26/2023, 11:06 AM

## 2023-06-26 NOTE — Progress Notes (Signed)
 Mobility Specialist Progress Note:   06/26/23 1112  Mobility  Activity Ambulated with assistance in room  Level of Assistance Minimal assist, patient does 75% or more  Assistive Device Front wheel walker  Distance Ambulated (ft) 30 ft  RLE Weight Bearing Per Provider Order WBAT  Activity Response Tolerated well  Mobility Referral Yes  Mobility visit 1 Mobility  Mobility Specialist Start Time (ACUTE ONLY) 1015  Mobility Specialist Stop Time (ACUTE ONLY) 1025  Mobility Specialist Time Calculation (min) (ACUTE ONLY) 10 min   Pt received in bed, eager to mobility. MinA to stand. CG during ambulation. Limited WB tolerance on RLE d/t knee pain. Rated pain 8/10. Pt returned to bed with call bell in reach and all needs met.   Leory Plowman  Mobility Specialist Please contact via [employer] office at [phone]

## 2023-06-27 ENCOUNTER — Inpatient Hospital Stay (HOSPITAL_COMMUNITY): Payer: Medicare Other

## 2023-06-27 DIAGNOSIS — S7291XS Unspecified fracture of right femur, sequela: Secondary | ICD-10-CM | POA: Diagnosis not present

## 2023-06-27 LAB — COMPREHENSIVE METABOLIC PANEL
ALT: 9 U/L (ref 0–44)
AST: 32 U/L (ref 15–41)
Albumin: 2.6 g/dL — ABNORMAL LOW (ref 3.5–5.0)
Alkaline Phosphatase: 47 U/L (ref 38–126)
Anion gap: 10 (ref 5–15)
BUN: 13 mg/dL (ref 8–23)
CO2: 25 mmol/L (ref 22–32)
Calcium: 8.5 mg/dL — ABNORMAL LOW (ref 8.9–10.3)
Chloride: 106 mmol/L (ref 98–111)
Creatinine, Ser: 0.58 mg/dL (ref 0.44–1.00)
GFR, Estimated: >60 mL/min (ref >60)
Glucose, Bld: 87 mg/dL (ref 70–99)
Potassium: 3.9 mmol/L (ref 3.5–5.1)
Sodium: 141 mmol/L (ref 135–145)
Total Bilirubin: 0.4 mg/dL (ref 0.0–1.2)
Total Protein: 5.7 g/dL — ABNORMAL LOW (ref 6.5–8.1)

## 2023-06-27 LAB — CBC WITH DIFFERENTIAL/PLATELET
Abs Immature Granulocytes: 0.02 10*3/uL (ref 0.00–0.07)
Basophils Absolute: 0 10*3/uL (ref 0.0–0.1)
Basophils Relative: 1 %
Eosinophils Absolute: 0.1 10*3/uL (ref 0.0–0.5)
Eosinophils Relative: 1 %
HCT: 25.2 % — ABNORMAL LOW (ref 36.0–46.0)
Hemoglobin: 7.8 g/dL — ABNORMAL LOW (ref 12.0–15.0)
Immature Granulocytes: 0 %
Lymphocytes Relative: 24 %
Lymphs Abs: 1.3 10*3/uL (ref 0.7–4.0)
MCH: 28.3 pg (ref 26.0–34.0)
MCHC: 31 g/dL (ref 30.0–36.0)
MCV: 91.3 fL (ref 80.0–100.0)
Monocytes Absolute: 0.4 10*3/uL (ref 0.1–1.0)
Monocytes Relative: 7 %
Neutro Abs: 3.8 10*3/uL (ref 1.7–7.7)
Neutrophils Relative %: 67 %
Platelets: 198 10*3/uL (ref 150–400)
RBC: 2.76 MIL/uL — ABNORMAL LOW (ref 3.87–5.11)
RDW: 16.1 % — ABNORMAL HIGH (ref 11.5–15.5)
WBC: 5.5 10*3/uL (ref 4.0–10.5)
nRBC: 0 % (ref 0.0–0.2)

## 2023-06-27 MED ORDER — VITAMIN D 25 MCG (1000 UNIT) PO TABS
2000.0000 [IU] | ORAL_TABLET | Freq: Every day | ORAL | Status: DC
  Administered 2023-06-28 – 2023-07-03 (×6): 2000 [IU] via ORAL
  Filled 2023-06-27 (×6): qty 2

## 2023-06-27 NOTE — Progress Notes (Signed)
 Patient ID: Bailey Petersen, female   DOB: 1940/12/22, 83 y.o.   MRN: [id number]  Met with pt to give team conference goals of mod/I level and target discharge date of 3/5. She is agreeable to this and feels will be ready by then.

## 2023-06-27 NOTE — Progress Notes (Signed)
 Inpatient Rehabilitation  Patient information reviewed and entered into eRehab system by [facility]. Karen Kays., CCC/SLP, PPS Coordinator.  Information including medical coding, functional ability and quality indicators will be reviewed and updated through discharge.

## 2023-06-27 NOTE — Plan of Care (Signed)
  Problem: Consults Goal: RH GENERAL PATIENT EDUCATION Description: See Patient Education module for education specifics. Outcome: Progressing   Problem: RH BOWEL ELIMINATION Goal: RH STG MANAGE BOWEL WITH ASSISTANCE Description: STG Manage Bowel with toileting Assistance. Outcome: Progressing Goal: RH STG MANAGE BOWEL W/MEDICATION W/ASSISTANCE Description: STG Manage Bowel with Medication with mod I Assistance. Outcome: Progressing   Problem: RH SAFETY Goal: RH STG ADHERE TO SAFETY PRECAUTIONS W/ASSISTANCE/DEVICE Description: STG Adhere to Safety Precautions With cues Assistance/Device. Outcome: Progressing   Problem: RH PAIN MANAGEMENT Goal: RH STG PAIN MANAGED AT OR BELOW PT'S PAIN GOAL Description: Pain < 4 with prns Outcome: Progressing   Problem: RH KNOWLEDGE DEFICIT GENERAL Goal: RH STG INCREASE KNOWLEDGE OF SELF CARE AFTER HOSPITALIZATION Description: Patient and spouse will be able to manage care at discharge using educational resources independently Outcome: Progressing

## 2023-06-27 NOTE — Progress Notes (Signed)
 Physical Therapy Session Note  Patient Details  Name: SHAGUANA LOVE MRN: [id number] Date of Birth: 07-11-1940  Today's Date: 06/27/2023 PT Individual Time: 1400-1456 PT Individual Time Calculation (min): 56 min   Short Term Goals: Week 1:  PT Short Term Goal 1 (Week 1): STG=LTG due to LOS  Skilled Therapeutic Interventions/Progress Updates:   Received pt semi-reclined in bed with husband at bedside. Pt agreeable to PT treatment and reported pain 3/10 in R hip increasing with mobility - RN notified and present to adminster pain medication. Session with emphasis on functional mobility/transfers, generalized strengthening and endurance, dynamic standing balance/coordination, and gait training. Pt transferred semi-reclined<>sitting L EOB with HOB elevated and use of bedrails supervision.   Pt performed all transfers with RW and CGA throughout session. Pt transported to/from room in [facility] dependently for time management purposes. Pt ambulated 37ft with RW and CGA - noted decreased stride and L step length with decreased weight bearing through R foot due to pain. Worked on seated BUE/BLE strengthening on Nustep at workload 2 increasing to 3 for 9 minutes and 25 seconds for a total of 409 steps with emphasis on cardiovascular endurance and R hip ROM - noted improvements in R hip ROM after activity. Pt ambulated additional 61ft with RW and CGA; then performed standing RLE toe taps to 3in step 3x12 with BUE support and CGA for balance. Attempted with LLE, but pt unable to tolerate placing weight onto RLE. Pt then performed standing mini squats 2x10, standing R hip abduction 3x10, and standing R hip flexion 2x10 with emphasis on LE strength/ROM. Returned to room and pt reported urge to void. Transferred to/from toilet with bedside commode over top via stand<>pivot using grab bar with CGA and able to manage clothing without assist. Pt able to void and perform hygiene management without assist. Pt handed off to NT  to assist with changing into nightgown.  Therapy Documentation Precautions:  Restrictions Weight Bearing Restrictions Per Provider Order: Yes RLE Weight Bearing Per Provider Order: Weight bearing as tolerated  Therapy/Group: Individual Therapy Marlana Salvage Zaunegger Blima Rich PT, DPT 06/27/2023, 6:53 AM

## 2023-06-27 NOTE — Evaluation (Signed)
 Physical Therapy Assessment and Plan  Patient Details  Name: Bailey Petersen MRN: [id number] Date of Birth: 1941-02-19  PT Diagnosis: Abnormal posture, Abnormality of gait, Difficulty walking, Edema, Muscle weakness, and Pain in R hip Rehab Potential: Good ELOS: 7 days   Today's Date: 06/27/2023 PT Individual Time: 0730-0825 PT Individual Time Calculation (min): 55 min    Hospital Problem: Principal Problem:   Closed fracture of right femur, unspecified fracture morphology, sequela   Past Medical History:  Past Medical History:  Diagnosis Date   Arthritis    RA   Complication of anesthesia    GERD (gastroesophageal reflux disease)    History of hiatal hernia    IBS (irritable bowel syndrome)    PONV (postoperative nausea and vomiting)    Rheumatoid arthritis (HCC)    Past Surgical History:  Past Surgical History:  Procedure Laterality Date   ABDOMINAL HYSTERECTOMY     BICEPT TENODESIS Right 05/10/2023   Procedure: BICEPS TENODESIS;  Surgeon: Cammy Copa, MD;  Location: [facility] OR;  Service: Orthopedics;  Laterality: Right;   EYE SURGERY Bilateral    cataract   FINGER ARTHROPLASTY Right 03/23/2020   Procedure: Right index finger metacarpal phalangeal arthroplasty with repair as necessary and centralization of the extensor tendon and right middle finger proximal interphalangeal arthroplasty with repair as necessary;  Surgeon: Dominica Severin, MD;  Location: [facility];  Service: Orthopedics;  Laterality: Right;   FOOT SURGERY Left    HAND SURGERY Left    INTRAMEDULLARY (IM) NAIL INTERTROCHANTERIC Right 06/24/2023   Procedure: INTRAMEDULLARY (IM) NAIL INTERTROCHANTERIC;  Surgeon: Huel Cote, MD;  Location: [facility] OR;  Service: Orthopedics;  Laterality: Right;   ORIF SHOULDER FRACTURE Left    REVERSE SHOULDER ARTHROPLASTY Right 05/10/2023   Procedure: REVERSE SHOULDER ARTHROPLASTY;  Surgeon: Cammy Copa, MD;  Location: [facility] OR;  Service: Orthopedics;   Laterality: Right;   TONSILLECTOMY      Assessment & Plan Clinical Impression: Patient is a 83 y.o. 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 She had immediate onset of right thigh pain and was found to have externally rotated right hip due to displaced right peritrochanteric femur Fx. She was evaluated by Dr. Steward Drone and underwent Cephalomedullary  nailing of right femur. Post op WBAT and on Lovenox for DVT prophylaxis. Post op noted to have ABLA as well as boderline low BP. PT/OT has been working with patient who continues to be limited by weakness, right knee pain and flexed posture. She was independent PTA and currently requires min assist with ADLs and mobility. CIR recommended due to functional decline.    Independent and active PTA. She was still going for PT for right shoulder after arthroplasty. 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   Patient currently requires min with mobility secondary to muscle weakness, decreased cardiorespiratoy endurance, and decreased standing balance, decreased postural control, and decreased balance strategies.  Prior to hospitalization, patient was independent  with mobility and lived with Spouse in a House home.  Home access is 1Stairs to enter.  Patient will benefit from skilled PT intervention to maximize safe functional mobility, minimize fall risk, and decrease caregiver burden for planned discharge home with intermittent assist.  Anticipate patient will benefit from follow up OP at discharge.  PT - End of Session Activity Tolerance: Tolerates 30+ min activity with multiple rests Endurance Deficit: Yes Endurance Deficit Description: required frequent  rest breaks with mobility PT Assessment Rehab Potential (ACUTE/IP ONLY): Good PT Barriers to Discharge: Home environment access/layout;Wound Care;Other (comments) PT Barriers to Discharge Comments:  pain, 1 STE with no rails, RLE weakness, fatigue PT Patient demonstrates impairments in the following area(s): Balance;Edema;Endurance;Pain;Skin Integrity PT Transfers Functional Problem(s): Bed Mobility;Bed to Chair;Car;Furniture PT Locomotion Functional Problem(s): Ambulation;Wheelchair Mobility;Stairs PT Plan PT Intensity: Minimum of 1-2 x/day ,45 to 90 minutes PT Frequency: 5 out of 7 days PT Duration Estimated Length of Stay: 7 days PT Treatment/Interventions: Ambulation/gait training;Disease management/prevention;Pain management;Stair training;Balance/vestibular training;DME/adaptive equipment instruction;Patient/family education;Therapeutic Activities;Wheelchair propulsion/positioning;Functional electrical stimulation;Psychosocial support;Therapeutic Exercise;Community reintegration;Functional mobility training;Skin care/wound management;UE/LE Strength taining/ROM;Discharge planning;Neuromuscular re-education;Splinting/orthotics;UE/LE Coordination activities PT Transfers Anticipated Outcome(s): Mod I with LRAD PT Locomotion Anticipated Outcome(s): Mod I with LRAD PT Recommendation Recommendations for Other Services: Therapeutic Recreation consult Therapeutic Recreation Interventions: Pet therapy;Stress management Follow Up Recommendations: Outpatient PT Patient destination: Home Equipment Recommended: Other (comment) Equipment Details: has RW   PT Evaluation Precautions/Restrictions Precautions Precautions: Fall Precaution/Restrictions Comments: recent R TSA on 05/10/23 without restrictions Restrictions Weight Bearing Restrictions Per Provider Order: Yes RLE Weight Bearing Per Provider Order: Weight bearing as tolerated Pain Interference Pain Interference Pain Effect on Sleep: 2. Occasionally Pain Interference with Therapy Activities: 1. Rarely or not at all Pain Interference with Day-to-Day Activities: 1. Rarely or not at all Home Living/Prior Functioning Home  Living Available Help at Discharge: Family;Available 24 hours/day;Personal care attendant (has CNA who will assist as needed) Type of Home: House Home Access: Stairs to enter Entrance Stairs-Number of Steps: 1 Entrance Stairs-Rails: None (can hold onto wall) Home Layout: Two level;Able to live on main level with bedroom/bathroom Alternate Level Stairs-Number of Steps: flight Alternate Level Stairs-Rails: Right Bathroom Shower/Tub: Tub/shower unit;Walk-in shower (typically uses walk in shower) Bathroom Toilet: Handicapped height Bathroom Accessibility: Yes Additional Comments: pt reports being independent prior to admission, 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 Has RW and bedside commode from her mother in law  Lives With: Spouse Prior Function Level of Independence: Independent with basic ADLs;Independent with transfers;Independent with homemaking with ambulation;Independent with gait  Able to Take Stairs?: Yes Driving: Yes Vocation: Part time employment Leisure: Hobbies-yes (Comment) Vision/Perception  Vision - History Ability to See in Adequate Light: 0 Adequate Perception Perception: Not tested Praxis Praxis: Not tested  Cognition Overall Cognitive Status: Within Functional Limits for tasks assessed Arousal/Alertness: Awake/alert Orientation Level: Oriented X4 Memory: Appears intact Awareness: Appears intact Problem Solving: Appears intact Safety/Judgment: Appears intact Sensation Sensation Light Touch: Appears Intact Hot/Cold: Not tested Proprioception: Appears Intact Stereognosis: Not tested Coordination Gross Motor Movements are Fluid and Coordinated: Yes Fine Motor Movements are Fluid and Coordinated: Yes Finger Nose Finger Test: WFL bilaterally Heel Shin Test: not tested Motor  Motor Motor: Within Functional Limits  Trunk/Postural Assessment  Cervical Assessment Cervical Assessment: Within Functional Limits Thoracic Assessment Thoracic Assessment:  Exceptions to [facility] (mild kyphosis) Lumbar Assessment Lumbar Assessment: Exceptions to [facility] (mild posterior pelvic tilt) Postural Control Postural Control: Within Functional Limits  Balance Balance Balance Assessed: Yes Static Sitting Balance Static Sitting - Balance Support: Feet supported;No upper extremity supported Static Sitting - Level of Assistance: 6: Modified independent (Device/Increase time) Dynamic Sitting Balance Dynamic Sitting - Balance Support: Feet supported;No upper extremity supported Dynamic Sitting - Level of Assistance: 5: Stand by assistance (supervision) Static Standing Balance Static Standing - Balance Support: Bilateral upper extremity supported;During functional activity (RW) Static Standing - Level of Assistance: 5: Stand by assistance (CGA) Dynamic Standing Balance Dynamic Standing - Balance Support: Bilateral upper  extremity supported;During functional activity (RW) Dynamic Standing - Level of Assistance: 4: Min assist Dynamic Standing - Comments: with transfers and gait Extremity Assessment  RLE Assessment RLE Assessment: Exceptions to [facility] General Strength Comments: tested sitting EOB. Hip flexion 2+/5, knee extension 3-/5, hip abd/add 3/5, knee flexion 3-/5, and ankle DF/PF 4-/5 LLE Assessment LLE Assessment: Exceptions to [facility] General Strength Comments: tested sitting EOB. Grossly 4-/5  Care Tool Care Tool Bed Mobility Roll left and right activity   Roll left and right assist level: Supervision/Verbal cueing    Sit to lying activity        Lying to sitting on side of bed activity   Lying to sitting on side of bed assist level: the ability to move from lying on the back to sitting on the side of the bed with no back support.: Supervision/Verbal cueing     Care Tool Transfers Sit to stand transfer   Sit to stand assist level: Minimal Assistance - Patient > 75%    Chair/bed transfer   Chair/bed transfer assist level: Minimal Assistance - Patient  > 75%    Car transfer   Car transfer assist level: Minimal Assistance - Patient > 75%      Care Tool Locomotion Ambulation   Assist level: Minimal Assistance - Patient > 75% Assistive device: Walker-rolling Max distance: 107ft  Walk 10 feet activity   Assist level: Minimal Assistance - Patient > 75% Assistive device: Walker-rolling   Walk 50 feet with 2 turns activity Walk 50 feet with 2 turns activity did not occur: Safety/medical concerns (pain, fatigue)      Walk 150 feet activity Walk 150 feet activity did not occur: Safety/medical concerns (pain, fatigue)      Walk 10 feet on uneven surfaces activity   Assist level: Minimal Assistance - Patient > 75% Assistive device: Walker-rolling  Stairs Stair activity did not occur: Safety/medical concerns (pain, fatigue)        Walk up/down 1 step activity Walk up/down 1 step or curb (drop down) activity did not occur: Safety/medical concerns (pain, fatigue)      Walk up/down 4 steps activity Walk up/down 4 steps activity did not occur: Safety/medical concerns (pain, fatigue)      Walk up/down 12 steps activity Walk up/down 12 steps activity did not occur: Safety/medical concerns (pain, fatigue)      Pick up small objects from floor Pick up small object from the floor (from standing position) activity did not occur: Safety/medical concerns (pain, fatigue)      Wheelchair Is the patient using a wheelchair?: Yes Type of Wheelchair: Manual   Wheelchair assist level: Dependent - Patient 0% Max wheelchair distance: >79ft  Wheel 50 feet with 2 turns activity   Assist Level: Dependent - Patient 0%  Wheel 150 feet activity   Assist Level: Dependent - Patient 0%    Refer to Care Plan for Long Term Goals  SHORT TERM GOAL WEEK 1 PT Short Term Goal 1 (Week 1): STG=LTG due to LOS  Recommendations for other services: Therapeutic Recreation  Pet therapy and Stress management  Skilled Therapeutic Intervention Evaluation completed  (see details above and below) with education on PT POC and goals and individual treatment initiated with focus on functional mobility/transfers, dressing, generalized strengthening and endurance, dynamic standing balance/coordination, simulated car transfers, and ambulation. Received pt semi-reclined in bed, pt educated on PT evaluation, CIR policies, and therapy schedule and agreeable. Pt reported pain 2/10 in R hip increasing to 6/10 with mobility -  RN notified and therapist provided pt with ice pack at end of session.   Provided pt with 16x16 manual WC with R elevating legrest and standard RW. Pt transferred semi-reclined<>sitting L EOB with HOB minimally elevated and use of bedrails with supervision and increased time/effort, using BUEs to move RLE. Donned socks, shoes, and pants sitting EOB with max A, then stood from low sitting EOB with RW and min A x 2 trials and required min A to pull pants over hips. Pt then ambulated 7ft with RW and CGA with close WC follow - limited by increased R hip pain and transported to/from ortho gym in [facility] dependently. Pt performed simulated car transfer with RW and CGA/min A (use of BUE to get RLE in/out of car), then ambulated 44ft on uneven surfaces (ramp) with RW and min A with cues for eccentric control. Returned to room and sat in [facility] at sink and brushed teeth with set up assist. Concluded session with pt sitting in WC, needs within reach, and chair pad alarm on. Safety plan updated and RN at bedside attending to care.  Mobility Bed Mobility Bed Mobility: Rolling Right;Supine to Sit Rolling Right: Supervision/verbal cueing Supine to Sit: Supervision/Verbal cueing Transfers Transfers: Sit to Stand;Stand to Sit;Stand Pivot Transfers Sit to Stand: Minimal Assistance - Patient > 75% Stand to Sit: Contact Guard/Touching assist Stand Pivot Transfers: Minimal Assistance - Patient > 75% Transfer (Assistive device): Rolling walker Locomotion  Gait Ambulation:  Yes Gait Assistance: Minimal Assistance - Patient > 75% Gait Distance (Feet): 32 Feet Assistive device: Rolling walker Gait Assistance Details: Verbal cues for gait pattern Gait Assistance Details: verbal cues to increase L step length Gait Gait: Yes Gait Pattern: Impaired Gait Pattern: Step-to pattern;Decreased step length - right;Decreased step length - left;Decreased stance time - right;Decreased stride length;Decreased weight shift to right;Poor foot clearance - left;Poor foot clearance - right;Narrow base of support Gait velocity: Decreased Stairs / Additional Locomotion Stairs: No Ramp: Minimal Assistance - Patient >75% (RW) Naval architect Mobility: Yes Wheelchair Assistance: Dependent - Patient 0% Wheelchair Parts Management: Needs assistance Distance: 23ft   Discharge Criteria: Patient will be discharged from PT if patient refuses treatment 3 consecutive times without medical reason, if treatment goals not met, if there is a change in medical status, if patient makes no progress towards goals or if patient is discharged from hospital.  The above assessment, treatment plan, treatment alternatives and goals were discussed and mutually agreed upon: by patient  Huntley Dec PT, DPT 06/27/2023, 8:40 AM

## 2023-06-27 NOTE — Patient Care Conference (Signed)
 Inpatient RehabilitationTeam Conference and Plan of Care Update Date: 06/27/2023   Time: 11:47 AM    Patient Name: Bailey Petersen      Medical Record Number: [id number]  Date of Birth: [birth year] Sex: Female         Room/Bed: 4M07C/4M07C-01 Payor Info: Payor: MEDICARE / Plan: MEDICARE PART A AND B / Product Type: *No Product type* /    Admit Date/Time:  06/26/2023  1:40 PM  Primary Diagnosis:  Closed fracture of right femur, unspecified fracture morphology, sequela  Hospital Problems: Principal Problem:   Closed fracture of right femur, unspecified fracture morphology, sequela    Expected Discharge Date: Expected Discharge Date: 07/04/23  Team Members Present: Physician leading conference: Dr. Sula Soda Social Worker Present: Dossie Der, LCSW Nurse Present: Chana Bode, RN PT Present: Blima Rich, PT OT Present: Lou Cal, OT PPS Coordinator present : Fae Pippin, SLP     Current Status/Progress Goal Weekly Team Focus  Bowel/Bladder   Patient continent of bowel and bladder. Last BM 2/24.   Remain continent.   Patient will remain continent.    Swallow/Nutrition/ Hydration               ADL's   Eval pending   Eval pending   Eval pending    Mobility   bed mobility supervision, transfers with RW min A, gait 55ft with RW min A   Mod I  barriers: pain, 1 STE, decreased balance, RLE weakness, deconditioning    Communication                Safety/Cognition/ Behavioral Observations               Pain   Patient reports pain in RLE.   No pain.   Provide PRNs and ice for pain.    Skin   Patient has surgical incision.   Keep dressing CDI.  Keep skin integrity.      Discharge Planning:  Home with husband who can provide assist if needed. Was already going to OP for her shoulder and will continue after DC. Await therapy evaluations   Team Discussion: Patient post fall with right femur fracture and previous total shoulder repair.   Limited by shortness of breath upon exertion; CXR and IS ordered; mild deconditioning and right hip pain.  Patient on target to meet rehab goals: yes, currently needs min assist using a Rw to ambulate up to 32'.  *See Care Plan and progress notes for long and short-term goals.   Revisions to Treatment Plan:  N/a   Teaching Needs: Safety, medications, transfers, toileting, etc.   Current Barriers to Discharge: Decreased caregiver support  Possible Resolutions to Barriers: Family education OP follow up services DME: RW     Medical Summary Current Status: hypotension, oxygen desaturation, dyspnea on exertion  Barriers to Discharge: Medical stability  Barriers to Discharge Comments: hypotension, oxygen desaturation, dyspnea on exertion Possible Resolutions to Barriers/Weekly Focus: continue to monitor BP TID, CXR ordered, incentive spirometer ordered   Continued Need for Acute Rehabilitation Level of Care: The patient requires daily medical management by a physician with specialized training in physical medicine and rehabilitation for the following reasons: Direction of a multidisciplinary physical rehabilitation program to maximize functional independence : Yes Medical management of patient stability for increased activity during participation in an intensive rehabilitation regime.: Yes Analysis of laboratory values and/or radiology reports with any subsequent need for medication adjustment and/or medical intervention. : Yes   I attest that I was  present, lead the team conference, and concur with the assessment and plan of the team.   Pamelia Hoit 06/27/2023, 2:32 PM

## 2023-06-27 NOTE — Evaluation (Signed)
 Occupational Therapy Assessment and Plan  Patient Details  Name: Bailey Petersen MRN: [id number] Date of Birth: December 06, 1940  OT Diagnosis: abnormal posture, acute pain, muscle weakness (generalized), and swelling of limb Rehab Potential: Rehab Potential (ACUTE ONLY): Excellent ELOS: 7 days   Today's Date: 06/27/2023 OT Individual Time: 5621-3086 OT Individual Time Calculation (min): 70 min     Hospital Problem: Principal Problem:   Closed fracture of right femur, unspecified fracture morphology, sequela   Past Medical History:  Past Medical History:  Diagnosis Date   Arthritis    RA   Complication of anesthesia    GERD (gastroesophageal reflux disease)    History of hiatal hernia    IBS (irritable bowel syndrome)    PONV (postoperative nausea and vomiting)    Rheumatoid arthritis (HCC)    Past Surgical History:  Past Surgical History:  Procedure Laterality Date   ABDOMINAL HYSTERECTOMY     BICEPT TENODESIS Right 05/10/2023   Procedure: BICEPS TENODESIS;  Surgeon: Cammy Copa, MD;  Location[facility];  Service: Orthopedics;  Laterality: Right;   EYE SURGERY Bilateral    cataract   FINGER ARTHROPLASTY Right 03/23/2020   Procedure: Right index finger metacarpal phalangeal arthroplasty with repair as necessary and centralization of the extensor tendon and right middle finger proximal interphalangeal arthroplasty with repair as necessary;  Surgeon: Dominica Severin, MD;  Location: [facility];  Service: Orthopedics;  Laterality: Right;   FOOT SURGERY Left    HAND SURGERY Left    INTRAMEDULLARY (IM) NAIL INTERTROCHANTERIC Right 06/24/2023   Procedure: INTRAMEDULLARY (IM) NAIL INTERTROCHANTERIC;  Surgeon: Huel Cote, MD;  Location: [facility] OR;  Service: Orthopedics;  Laterality: Right;   ORIF SHOULDER FRACTURE Left    REVERSE SHOULDER ARTHROPLASTY Right 05/10/2023   Procedure: REVERSE SHOULDER ARTHROPLASTY;  Surgeon: Cammy Copa, MD;  Location: [facility] OR;   Service: Orthopedics;  Laterality: Right;   TONSILLECTOMY      Assessment & Plan Clinical Impression:  Bailey Petersen is an 83 year old female with history of RA, IBS, 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 She had immediate onset of right thigh pain and was found to have externally rotated right hip due to displaced right peritrochanteric femur Fx. She was evaluated by Dr. Steward Drone and underwent Cephalomedullary nailing of right femur. Post op WBAT and on Lovenox for DVT prophylaxis. Post op noted to have ABLA as well as boderline low BP. PT/OT has been working with patient who continues to be limited by weakness, right knee pain and flexed posture. She was independent PTA and currently requires min assist with ADLs and mobility. CIR recommended due to functional decline. Patient transferred to CIR on 06/26/2023 .    Patient currently requires min with basic self-care skills secondary to muscle weakness, decreased cardiorespiratoy endurance, decreased coordination, and decreased standing balance.  Prior to hospitalization, patient could complete self-care independently.  Patient will benefit from skilled intervention to decrease level of assist with basic self-care skills and increase independence with basic self-care skills prior to discharge home with care partner.  Anticipate patient will require follow up home health.  OT - End of Session Activity Tolerance: Tolerates 10 - 20 min activity with multiple rests Endurance Deficit: Yes Endurance Deficit Description: required frequent rest breaks with ADLs OT Assessment Rehab Potential (ACUTE ONLY): Excellent OT Barriers to Discharge: Inaccessible home environment;Home environment access/layout;Wound Care OT Patient demonstrates impairments in the following area(s):  Balance;Edema;Endurance;Motor;Pain OT Basic ADL's Functional Problem(s): Bathing;Toileting;Dressing OT Advanced  ADL's Functional Problem(s): Simple Meal Preparation OT Transfers Functional Problem(s): Toilet;Tub/Shower OT Additional Impairment(s): Fuctional Use of Upper Extremity OT Plan OT Intensity: Minimum of 1-2 x/day, 45 to 90 minutes OT Frequency: 5 out of 7 days OT Duration/Estimated Length of Stay: 7 days OT Treatment/Interventions: Balance/vestibular training;DME/adaptive equipment instruction;Patient/family education;Therapeutic Activities;Wheelchair propulsion/positioning;Therapeutic Exercise;Community reintegration;Functional mobility training;Self Care/advanced ADL retraining;UE/LE Strength taining/ROM;Discharge planning;Neuromuscular re-education;Skin care/wound managment;UE/LE Coordination activities;Disease mangement/prevention;Pain management OT Self Feeding Anticipated Outcome(s): Independent OT Basic Self-Care Anticipated Outcome(s): Mod I OT Toileting Anticipated Outcome(s): Mod I OT Bathroom Transfers Anticipated Outcome(s): Mod I OT Recommendation Recommendations for Other Services: Therapeutic Recreation consult Therapeutic Recreation Interventions: Pet therapy;Stress management;Outing/community reintergration Patient destination: Home Follow Up Recommendations: Home health OT Equipment Recommended: To be determined   OT Evaluation Precautions/Restrictions  Precautions Precautions: Fall Precaution/Restrictions Comments: recent R TSA on 05/10/23 without restrictions Restrictions Weight Bearing Restrictions Per Provider Order: Yes RLE Weight Bearing Per Provider Order: Weight bearing as tolerated Home Living/Prior Functioning Home Living Family/patient expects to be discharged to:: Private residence Living Arrangements: Spouse/significant other Available Help at Discharge: Family, Available 24 hours/day, Personal care attendant (has CNA who will assist as needed) Type of Home: House Home Access: Stairs to enter [employer] of Steps: 1 Entrance Stairs-Rails:  None (can hold onto wall) Home Layout: Two level, Able to live on main level with bedroom/bathroom Alternate Level Stairs-Number of Steps: flight Alternate Level Stairs-Rails: Right Bathroom Shower/Tub: Tub/shower unit, Walk-in shower (walk in shower is downstairs and walk in & tub shower is upstairs) Bathroom Toilet: Handicapped height Bathroom Accessibility: Yes Additional Comments: pt reports being independent prior to admission, 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 Prior Function Level of Independence: Independent with basic ADLs, Independent with transfers, Independent with homemaking with ambulation, Independent with gait  Able to Take Stairs?: Yes Driving: Yes Vocation: Part time employment Leisure: Hobbies-yes (Comment) Vision Baseline Vision/History: 1 Wears glasses Ability to See in Adequate Light: 0 Adequate Patient Visual Report: No change from baseline Vision Assessment?: Wears glasses for reading Perception  Perception: Not tested Praxis Praxis: Not tested Cognition Cognition Overall Cognitive Status: Within Functional Limits for tasks assessed Arousal/Alertness: Awake/alert Orientation Level: Person;Place;Situation Person: Oriented Place: Oriented Situation: Oriented Memory: Appears intact Awareness: Appears intact Problem Solving: Appears intact Safety/Judgment: Appears intact Brief Interview for Mental Status (BIMS) Repetition of Three Words (First Attempt): 3 Temporal Orientation: Year: Correct Temporal Orientation: Month: Accurate within 5 days Temporal Orientation: Day: Correct Recall: "Sock": Yes, no cue required Recall: "Blue": Yes, no cue  required Recall: "Bed": Yes, no cue required BIMS Summary Score: 15 Sensation Sensation Light Touch: Appears Intact Hot/Cold: Not tested Proprioception: Appears Intact Stereognosis: Not tested Coordination Gross Motor Movements are Fluid and Coordinated: Yes Fine Motor Movements are Fluid and Coordinated: Yes Finger Nose Finger Test: WFL bilaterally Heel Shin Test: not tested Motor  Motor Motor: Within Functional Limits  Trunk/Postural Assessment  Cervical Assessment Cervical Assessment: Within Functional Limits Thoracic Assessment Thoracic Assessment: Exceptions to [facility] (mild kyphosis) Lumbar Assessment Lumbar Assessment: Exceptions to [facility] (mild posterior pelvic tilt) Postural Control Postural Control: Within Functional Limits  Balance Balance Balance Assessed: Yes Static Sitting Balance Static Sitting - Balance Support: Feet supported;No upper  extremity supported Static Sitting - Level of Assistance: 6: Modified independent (Device/Increase time) Dynamic Sitting Balance Dynamic Sitting - Balance Support: Feet supported;No upper extremity supported Dynamic Sitting - Level of Assistance: 5: Stand by assistance (supervision) Static Standing Balance Static Standing - Balance Support: Bilateral upper extremity supported;During functional activity (RW) Static Standing - Level of Assistance: 5: Stand by assistance (CGA) Dynamic Standing Balance Dynamic Standing - Balance Support: Bilateral upper extremity supported;During functional activity (RW) Dynamic Standing - Level of Assistance: 4: Min assist Dynamic Standing - Comments: with transfers and gait Extremity/Trunk Assessment RUE Assessment RUE Assessment: Exceptions to [facility] Active Range of Motion (AROM) Comments: Recent TSA without restrictions, however limited at 100 degrees shoulder flexion General Strength Comments: did not formally test LUE Assessment LUE Assessment: Within Functional Limits  Care Tool Care Tool  Self Care Eating   Eating Assist Level: Set up assist    Oral Care    Oral Care Assist Level: Set up assist    Bathing   Body parts bathed by patient: Right arm;Left arm;Abdomen;Chest;Front perineal area;Buttocks;Right upper leg;Left upper leg;Left lower leg;Face Body parts bathed by helper: Right lower leg   Assist Level: Minimal Assistance - Patient > 75%    Upper Body Dressing(including orthotics)   What is the patient wearing?: Pull over shirt   Assist Level: Set up assist    Lower Body Dressing (excluding footwear)   What is the patient wearing?: Underwear/pull up;Pants Assist for lower body dressing: Minimal Assistance - Patient > 75%    Putting on/Taking off footwear   What is the patient wearing?: Non-skid slipper socks Assist for footwear: Dependent - Patient 0%       Care Tool Toileting Toileting activity   Assist for toileting: Minimal Assistance - Patient > 75%     Care Tool Bed Mobility Roll left and right activity   Roll left and right assist level: Supervision/Verbal cueing    Sit to lying activity        Lying to sitting on side of bed activity   Lying to sitting on side of bed assist level: the ability to move from lying on the back to sitting on the side of the bed with no back support.: Supervision/Verbal cueing     Care Tool Transfers Sit to stand transfer   Sit to stand assist level: Minimal Assistance - Patient > 75%    Chair/bed transfer   Chair/bed transfer assist level: Minimal Assistance - Patient > 75%     Toilet transfer   Assist Level: Minimal Assistance - Patient > 75%     Care Tool Cognition  Expression of Ideas and Wants Expression of Ideas and Wants: 4. Without difficulty (complex and basic) - expresses complex messages without difficulty and with speech that is clear and easy to understand  Understanding Verbal and Non-Verbal Content Understanding Verbal and Non-Verbal Content: 4. Understands (complex and basic) - clear  comprehension without cues or repetitions   Memory/Recall Ability     Refer to Care Plan for Long Term Goals  SHORT TERM GOAL WEEK 1 OT Short Term Goal 1 (Week 1): STG = LTG due to ELOS  Recommendations for other services: Therapeutic Recreation  Pet therapy and Stress management   Skilled Therapeutic Intervention Patient received seated in w/c upon therapy arrival and agreeable to participate in OT evaluation. Education provided on OT purpose, therapy schedule, goals for therapy, and safety policy while in rehab. 5/10 pain reported in Rt hip; pre-medicated. OT offered rest breaks,  repositioning and moist heat via shower for pain reduction.  Patient demonstrates dynamic standing balance, GM coordination, RUE strength, functional endurance and global strength deficits resulting in difficulty completing BADL tasks without increased physical assist. Cues needed throughout for body positioning, sequencing and use of RW with cues also for stride length with ambulation as pt with habit of wanting to pivot on BLE vs stepping. Pt will benefit from skilled OT services to focus on mentioned deficits. See below for ADL and functional transfer performance. ADLs completed at shower level. Pt completed x30 knee flexion/extension towel slides on floor for edema/strength management. Pt remained seated in w/c at conclusion of session with chair alarm on and all needs met at end of session.    ADL ADL Eating: Set up Where Assessed-Eating: Wheelchair Grooming: Setup Where Assessed-Grooming: Wheelchair Upper Body Bathing: Supervision/safety Where Assessed-Upper Body Bathing: Shower Lower Body Bathing: Minimal assistance Where Assessed-Lower Body Bathing: Shower Upper Body Dressing: Setup Where Assessed-Upper Body Dressing: Other (Comment) (BSC) Lower Body Dressing: Minimal assistance Where Assessed-Lower Body Dressing: Other (Comment) (BSC) Toileting: Minimal assistance Where Assessed-Toileting:  Teacher, adult education: Curator Method: Proofreader: Raised toilet seat;Grab bars;Other (comment) (RW) Tub/Shower Transfer: Unable to assess Tub/Shower Transfer Method: Unable to assess Film/video editor: Insurance underwriter Method: Designer, industrial/product: Transfer tub bench;Grab bars Mobility  Bed Mobility Bed Mobility: Rolling Right;Supine to [employer] Right: Supervision/verbal cueing Supine to Sit: Supervision/Verbal cueing Transfers Sit to Stand: Minimal Assistance - Patient > 75% Stand to Sit: Contact Guard/Touching assist   Discharge Criteria: Patient will be discharged from OT if patient refuses treatment 3 consecutive times without medical reason, if treatment goals not met, if there is a change in medical status, if patient makes no progress towards goals or if patient is discharged from hospital.  The above assessment, treatment plan, treatment alternatives and goals were discussed and mutually agreed upon: by patient  Melvyn Novas, MS, OTR/L  06/27/2023, 12:29 PM

## 2023-06-27 NOTE — Plan of Care (Signed)
  Problem: RH Balance Goal: LTG Patient will maintain dynamic standing with ADLs (OT) Description: LTG:  Patient will maintain dynamic standing balance with assist during activities of daily living (OT)  Flowsheets (Taken 06/27/2023 1041) LTG: Pt will maintain dynamic standing balance during ADLs with: Independent with assistive device   Problem: Sit to Stand Goal: LTG:  Patient will perform sit to stand in prep for activites of daily living with assistance level (OT) Description: LTG:  Patient will perform sit to stand in prep for activites of daily living with assistance level (OT) Flowsheets (Taken 06/27/2023 1041) LTG: PT will perform sit to stand in prep for activites of daily living with assistance level: Independent with assistive device   Problem: RH Bathing Goal: LTG Patient will bathe all body parts with assist levels (OT) Description: LTG: Patient will bathe all body parts with assist levels (OT) Flowsheets (Taken 06/27/2023 1041) LTG: Pt will perform bathing with assistance level/cueing: Independent with assistive device    Problem: RH Dressing Goal: LTG Patient will perform lower body dressing w/assist (OT) Description: LTG: Patient will perform lower body dressing with assist, with/without cues in positioning using equipment (OT) Flowsheets (Taken 06/27/2023 1041) LTG: Pt will perform lower body dressing with assistance level of: Independent with assistive device   Problem: RH Toileting Goal: LTG Patient will perform toileting task (3/3 steps) with assistance level (OT) Description: LTG: Patient will perform toileting task (3/3 steps) with assistance level (OT)  Flowsheets (Taken 06/27/2023 1041) LTG: Pt will perform toileting task (3/3 steps) with assistance level: Independent with assistive device   Problem: RH Simple Meal Prep Goal: LTG Patient will perform simple meal prep w/assist (OT) Description: LTG: Patient will perform simple meal prep with assistance, with/without  cues (OT). Flowsheets (Taken 06/27/2023 1041) LTG: Pt will perform simple meal prep with assistance level of: Independent with assistive device   Problem: RH Toilet Transfers Goal: LTG Patient will perform toilet transfers w/assist (OT) Description: LTG: Patient will perform toilet transfers with assist, with/without cues using equipment (OT) Flowsheets (Taken 06/27/2023 1041) LTG: Pt will perform toilet transfers with assistance level of: Independent with assistive device   Problem: RH Tub/Shower Transfers Goal: LTG Patient will perform tub/shower transfers w/assist (OT) Description: LTG: Patient will perform tub/shower transfers with assist, with/without cues using equipment (OT) Flowsheets (Taken 06/27/2023 1041) LTG: Pt will perform tub/shower stall transfers with assistance level of: Independent with assistive device

## 2023-06-27 NOTE — Progress Notes (Signed)
 Inpatient Rehabilitation Center Individual Statement of Services  Patient Name:  Bailey Petersen  Date:  06/27/2023  Welcome to the Inpatient Rehabilitation Center.  Our goal is to provide you with an individualized program based on your diagnosis and situation, designed to meet your specific needs.  With this comprehensive rehabilitation program, you will be expected to participate in at least 3 hours of rehabilitation therapies Monday-Friday, with modified therapy programming on the weekends.  Your rehabilitation program will include the following services:  Physical Therapy (PT), Occupational Therapy (OT), 24 hour per day rehabilitation nursing, Therapeutic Recreaction (TR), Care Coordinator, Rehabilitation Medicine, Nutrition Services, and Pharmacy Services  Weekly team conferences will be held on Wednesday to discuss your progress.  Your Inpatient Rehabilitation Care Coordinator will talk with you frequently to get your input and to update you on team discussions.  Team conferences with you and your family in attendance may also be held.  Expected length of stay: 7 days  Overall anticipated outcome: mod/I level  Depending on your progress and recovery, your program may change. Your Inpatient Rehabilitation Care Coordinator will coordinate services and will keep you informed of any changes. Your Inpatient Rehabilitation Care Coordinator's name and contact numbers are listed  below.  The following services may also be recommended but are not provided by the Inpatient Rehabilitation Center:  Driving Evaluations Home Health Rehabiltiation Services Outpatient Rehabilitation Services    Arrangements will be made to provide these services after discharge if needed.  Arrangements include referral to agencies that provide these services.  Your insurance has been verified to be:  medicare and [employer] Your primary doctor is:  Lorelei Pont  Pertinent information will be shared with your  doctor and your insurance company.  Inpatient Rehabilitation Care Coordinator:  Dossie Der, [place] [phone] or Luna Glasgow  Information discussed with and copy given to patient by: Lucy Chris, 06/27/2023, 9:04 AM

## 2023-06-27 NOTE — Progress Notes (Signed)
 Inpatient Rehabilitation Care Coordinator Assessment and Plan Patient Details  Name: Bailey Petersen MRN: [id number] Date of Birth: 01-11-1941  Today's Date: 06/27/2023  Hospital Problems: Principal Problem:   Closed fracture of right femur, unspecified fracture morphology, sequela  Past Medical History:  Past Medical History:  Diagnosis Date   Arthritis    RA   Complication of anesthesia    GERD (gastroesophageal reflux disease)    History of hiatal hernia    IBS (irritable bowel syndrome)    PONV (postoperative nausea and vomiting)    Rheumatoid arthritis (HCC)    Past Surgical History:  Past Surgical History:  Procedure Laterality Date   ABDOMINAL HYSTERECTOMY     BICEPT TENODESIS Right 05/10/2023   Procedure: BICEPS TENODESIS;  Surgeon: Cammy Copa, MD;  Location: [facility] OR;  Service: Orthopedics;  Laterality: Right;   EYE SURGERY Bilateral    cataract   FINGER ARTHROPLASTY Right 03/23/2020   Procedure: Right index finger metacarpal phalangeal arthroplasty with repair as necessary and centralization of the extensor tendon and right middle finger proximal interphalangeal arthroplasty with repair as necessary;  Surgeon: Dominica Severin, MD;  Location: [facility];  Service: Orthopedics;  Laterality: Right;   FOOT SURGERY Left    HAND SURGERY Left    INTRAMEDULLARY (IM) NAIL INTERTROCHANTERIC Right 06/24/2023   Procedure: INTRAMEDULLARY (IM) NAIL INTERTROCHANTERIC;  Surgeon: Huel Cote, MD;  Location: [facility] OR;  Service: Orthopedics;  Laterality: Right;   ORIF SHOULDER FRACTURE Left    REVERSE SHOULDER ARTHROPLASTY Right 05/10/2023   Procedure: REVERSE SHOULDER ARTHROPLASTY;  Surgeon: Cammy Copa, MD;  Location: [facility] OR;  Service: Orthopedics;  Laterality: Right;   TONSILLECTOMY     Social History:  reports that she has never smoked. She has never used smokeless tobacco. She reports current alcohol use of about 5.0 standard drinks of alcohol per  week. She reports that she does not use drugs.  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 She feels she has good supports and will have what is needed.  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 She is motivated to do well and recover from this hip fracture. Was independent prior to admission was going to therapy for her shoulder Recent Psychosocial Issues: recent shoulder surgery 05/10/2023 Psychiatric History: No hx or issues seems to be coping appropriately and doing well with  this Substance Abuse History: social  drinker drinks wine occassionally  Patient / Family Perceptions, Expectations & Goals Pt/Family understanding of illness & functional limitations: Pt can explain her hip fracture and her WB issues. She talks with the MD's involved and feels has a good understanding of her plan moving forward and hopeful to do well here. 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 Anticipated changes in roles/activities/participation: resume Pt/family expectations/goals: Pt states: " I was protecting my shoulder and could not stop my fall."  [employer]: Other (Comment) (Going to OP at Dr Diamantina [place] office) Premorbid Home Care/DME Agencies: Other (Comment) (has comfort toliets and shower seat along with grab bars) Transportation available at discharge: husband Is the patient able to respond to transportation needs?: Yes In the past 12 months, has lack of transportation kept you from medical appointments or from getting medications?: No In the past 12 months, has lack of transportation kept you from meetings, work, or from getting things needed for daily living?: No  Discharge Planning Living Arrangements: Spouse/significant other Support Systems: Spouse/significant other, Children, Other relatives, Friends/neighbors Type of Residence: Private residence Insurance Resources: [employer] Surveyor, 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 Pt is hopeufl she will be mod/i with a rolling walker by discharge. Aware being evaluated today and goals being set. Care Coordinator Anticipated Follow Up Needs: HH/OP  Clinical Impression Pleasant female who had an  unfortunate fall. She was healing well from her shoulder surgery and was protecting this when she fell. Her husband is involved and can assist if needed. Was going to OP prior to admission. Await therapy evaluations.  Lucy Chris 06/27/2023, 9:03 AM

## 2023-06-27 NOTE — Progress Notes (Addendum)
 PROGRESS NOTE   Subjective/Complaints: Hgb down to 7.8, steadily decreasing, will repeat tomorrow and ordered stool occult, on aspirin and lovenox, ambulating 40 feet Will provide list of foods for blood sugars  ROS: +right hip pain   Objective:   No results found. Recent Labs    06/26/23 0751 06/27/23 0532  WBC 7.2 5.5  HGB 8.3* 7.8*  HCT 27.2* 25.2*  PLT 193 198   Recent Labs    06/26/23 0751 06/27/23 0532  NA 139 141  K 3.5 3.9  CL 109 106  CO2 23 25  GLUCOSE 149* 87  BUN 14 13  CREATININE 0.72 0.58  CALCIUM 8.1* 8.5*    Intake/Output Summary (Last 24 hours) at 06/27/2023 0906 Last data filed at 06/27/2023 0757 Gross per 24 hour  Intake 240 ml  Output --  Net 240 ml        Physical Exam: Vital Signs Blood pressure 121/69, pulse 74, temperature 97.8 F (36.6 C), resp. rate 17, height 5\' 3"  (1.6 m), weight 59 kg, SpO2 94%. Gen: no distress, normal appearing HEENT: oral mucosa pink and moist, NCAT Cardio: Reg rate Chest: normal effort, normal rate of breathing Abd: soft, non-distended Musculoskeletal:     Cervical back: Neck supple. No tenderness.     Comments: Edema right hip/right thigh. Multiple dressings right thigh to the knee.    UE strength 5/5 except Grip and FA 5-/5 B/L LLE 5/5 in HF, KE, KF, DF and PF RLE- HF and KE 2-/5 limited due to pain; distally 5/5  Skin:    General: Skin is warm and dry.     Comments: Dressings on R thigh laterally- moderate edema  Neurological:     Mental Status: She is alert and oriented to person, place, and time.     Comments: Intact to light touch in all 4 extremities, stable 2/26   Psychiatric:        Mood and Affect: Mood normal.        Behavior: Behavior normal.    Assessment/Plan: 1. Functional deficits which require 3+ hours per day of interdisciplinary therapy in a comprehensive inpatient rehab setting. Physiatrist is providing close team  supervision and 24 hour management of active medical problems listed below. Physiatrist and rehab team continue to assess barriers to discharge/monitor patient progress toward functional and medical goals  Care Tool:  Bathing              Bathing assist       Upper Body Dressing/Undressing Upper body dressing        Upper body assist      Lower Body Dressing/Undressing Lower body dressing            Lower body assist       Toileting Toileting    Toileting assist       Transfers Chair/bed transfer  Transfers assist     Chair/bed transfer assist level: Minimal Assistance - Patient > 75%     Locomotion Ambulation   Ambulation assist      Assist level: Minimal Assistance - Patient > 75% Assistive device: Walker-rolling Max distance: 17ft   Walk 10 feet activity   Assist  Assist level: Minimal Assistance - Patient > 75% Assistive device: Walker-rolling   Walk 50 feet activity   Assist Walk 50 feet with 2 turns activity did not occur: Safety/medical concerns (pain, fatigue)         Walk 150 feet activity   Assist Walk 150 feet activity did not occur: Safety/medical concerns (pain, fatigue)         Walk 10 feet on uneven surface  activity   Assist     Assist level: Minimal Assistance - Patient > 75% Assistive device: Walker-rolling   Wheelchair     Assist Is the patient using a wheelchair?: Yes Type of Wheelchair: Manual    Wheelchair assist level: Dependent - Patient 0% Max wheelchair distance: >29ft    Wheelchair 50 feet with 2 turns activity    Assist        Assist Level: Dependent - Patient 0%   Wheelchair 150 feet activity     Assist      Assist Level: Dependent - Patient 0%   Blood pressure 121/69, pulse 74, temperature 97.8 F (36.6 C), resp. rate 17, height 5\' 3"  (1.6 m), weight 59 kg, SpO2 94%.   Medical Problem List and Plan: 1. Functional deficits secondary to right femoral fx  s/Bailey cephalomedullary nail- with WBAT             -patient may  shower- cover incision             -ELOS/Goals: 7-10 days mod I to supervision             Initial CIR evals today 2.  Antithrombotics: -DVT/anticoagulation:  Pharmaceutical: Lovenox             -antiplatelet therapy: ASA 81 mg prn mild for pain. .  3. Pain Management:  MSIR prn for severe pain and tramadol for moderate pain prn. ain.  4. Mood/Behavior/Sleep: LCSW to follow for evaluation and support.              -antipsychotic agents: N/A 5. Neuropsych/cognition: This patient is capable of making decisions on her own behalf. 6. Skin/Wound Care: Routine pressure relief measures monitor incision for healing.  7. Fluids/Electrolytes/Nutrition: Monitor I/O. Check CMET in am 8. Right femur Fx s/Bailey IM nail: WBAT. D3 started  9. RA: Managed with sulfasalazine with folic acid daily. Followed by Dr. Alver Fisher             --d/c Naprosyn prn --was causing GI distress. Continue Voltaren gel prn.   10. ABLA: Recheck CBC in am. Stool occult ordered  11. Dyspnea on exertion: CXR ordered  LOS: 1 days A FACE TO FACE EVALUATION WAS PERFORMED  Bailey Petersen Bailey Petersen 06/27/2023, 9:06 AM

## 2023-06-28 DIAGNOSIS — S7291XS Unspecified fracture of right femur, sequela: Secondary | ICD-10-CM | POA: Diagnosis not present

## 2023-06-28 LAB — CBC WITH DIFFERENTIAL/PLATELET
Abs Immature Granulocytes: 0.02 10*3/uL (ref 0.00–0.07)
Basophils Absolute: 0 10*3/uL (ref 0.0–0.1)
Basophils Relative: 0 %
Eosinophils Absolute: 0.1 10*3/uL (ref 0.0–0.5)
Eosinophils Relative: 1 %
HCT: 28.8 % — ABNORMAL LOW (ref 36.0–46.0)
Hemoglobin: 8.7 g/dL — ABNORMAL LOW (ref 12.0–15.0)
Immature Granulocytes: 0 %
Lymphocytes Relative: 23 %
Lymphs Abs: 1.2 10*3/uL (ref 0.7–4.0)
MCH: 27.4 pg (ref 26.0–34.0)
MCHC: 30.2 g/dL (ref 30.0–36.0)
MCV: 90.9 fL (ref 80.0–100.0)
Monocytes Absolute: 0.3 10*3/uL (ref 0.1–1.0)
Monocytes Relative: 6 %
Neutro Abs: 3.6 10*3/uL (ref 1.7–7.7)
Neutrophils Relative %: 70 %
Platelets: 242 10*3/uL (ref 150–400)
RBC: 3.17 MIL/uL — ABNORMAL LOW (ref 3.87–5.11)
RDW: 16.4 % — ABNORMAL HIGH (ref 11.5–15.5)
WBC: 5.2 10*3/uL (ref 4.0–10.5)
nRBC: 0 % (ref 0.0–0.2)

## 2023-06-28 LAB — BASIC METABOLIC PANEL
Anion gap: 9 (ref 5–15)
BUN: 12 mg/dL (ref 8–23)
CO2: 26 mmol/L (ref 22–32)
Calcium: 8.9 mg/dL (ref 8.9–10.3)
Chloride: 107 mmol/L (ref 98–111)
Creatinine, Ser: 0.69 mg/dL (ref 0.44–1.00)
GFR, Estimated: >60 mL/min (ref >60)
Glucose, Bld: 99 mg/dL (ref 70–99)
Potassium: 4.2 mmol/L (ref 3.5–5.1)
Sodium: 142 mmol/L (ref 135–145)

## 2023-06-28 MED ORDER — CAMPHOR-MENTHOL 0.5-0.5 % EX LOTN
TOPICAL_LOTION | CUTANEOUS | Status: DC | PRN
  Filled 2023-06-28: qty 222

## 2023-06-28 MED ORDER — POLYETHYLENE GLYCOL 3350 17 G PO PACK
17.0000 g | PACK | Freq: Every day | ORAL | Status: DC
  Administered 2023-06-28 – 2023-06-29 (×2): 17 g via ORAL
  Filled 2023-06-28 (×2): qty 1

## 2023-06-28 MED FILL — Hydromorphone HCl Inj 2 MG/ML: INTRAMUSCULAR | Qty: 1 | Status: AC

## 2023-06-28 NOTE — Progress Notes (Signed)
 PROGRESS NOTE   Subjective/Complaints: No new complaints this morning Applying ice, pain is well controlled She is very happy with her therapists and d/c date  ROS: +right hip pain, +shortness of breath   Objective:   DG Chest 2 View Result Date: 06/27/2023 CLINICAL DATA:  Shortness of breath EXAM: CHEST - 2 VIEW COMPARISON:  Chest x-ray 06/25/2022.  CT of the chest 06/25/2022. FINDINGS: The heart size and mediastinal contours are within normal limits. Faint nodular density seen in the right upper lobe measuring 5 mm. The lungs are otherwise clear. There is no pleural effusion or pneumothorax. The cardiomediastinal silhouette is within normal limits. Right shoulder arthroplasty is present. There is a proximal left humeral fixation plate and screws. IMPRESSION: 1. No active cardiopulmonary disease. 2. Faint nodular density in the right upper lobe measuring 5 mm. This can be followed up with chest CT as clinically warranted. Electronically Signed   By: Darliss Cheney M.D.   On: 06/27/2023 19:36   Recent Labs    06/27/23 0532 06/28/23 0611  WBC 5.5 5.2  HGB 7.8* 8.7*  HCT 25.2* 28.8*  PLT 198 242   Recent Labs    06/27/23 0532 06/28/23 0611  NA 141 142  K 3.9 4.2  CL 106 107  CO2 25 26  GLUCOSE 87 99  BUN 13 12  CREATININE 0.58 0.69  CALCIUM 8.5* 8.9    Intake/Output Summary (Last 24 hours) at 06/28/2023 1100 Last data filed at 06/28/2023 0720 Gross per 24 hour  Intake 478 ml  Output --  Net 478 ml        Physical Exam: Vital Signs Blood pressure (!) 143/61, pulse 72, temperature (!) 97.4 F (36.3 C), resp. rate 18, height 5\' 3"  (1.6 m), weight 59 kg, SpO2 100%. Gen: no distress, normal appearing HEENT: oral mucosa pink and moist, NCAT Cardio: Reg rate Chest: normal effort, normal rate of breathing Abd: soft, non-distended Musculoskeletal:     Cervical back: Neck supple. No tenderness.     Comments: Edema  right hip/right thigh. Multiple dressings right thigh to the knee.    UE strength 5/5 except Grip and FA 5-/5 B/L LLE 5/5 in HF, KE, KF, DF and PF RLE- HF and KE 2-/5 limited due to pain; distally 5/5  Skin:    General: Skin is warm and dry.     Comments: Dressings on R thigh laterally- moderate edema  Neurological:     Mental Status: She is alert and oriented to person, place, and time.     Comments: Intact to light touch in all 4 extremities, stable 2/27   Psychiatric:        Mood and Affect: Mood normal.        Behavior: Behavior normal.    Assessment/Plan: 1. Functional deficits which require 3+ hours per day of interdisciplinary therapy in a comprehensive inpatient rehab setting. Physiatrist is providing close team supervision and 24 hour management of active medical problems listed below. Physiatrist and rehab team continue to assess barriers to discharge/monitor patient progress toward functional and medical goals  Care Tool:  Bathing    Body parts bathed by patient: Right arm, Left arm, Abdomen,  Chest, Front perineal area, Buttocks, Right upper leg, Left upper leg, Left lower leg, Face   Body parts bathed by helper: Right lower leg     Bathing assist Assist Level: Minimal Assistance - Patient > 75%     Upper Body Dressing/Undressing Upper body dressing   What is the patient wearing?: Pull over shirt    Upper body assist Assist Level: Set up assist    Lower Body Dressing/Undressing Lower body dressing      What is the patient wearing?: Underwear/pull up, Pants     Lower body assist Assist for lower body dressing: Minimal Assistance - Patient > 75%     Toileting Toileting    Toileting assist Assist for toileting: Minimal Assistance - Patient > 75%     Transfers Chair/bed transfer  Transfers assist     Chair/bed transfer assist level: Contact Guard/Touching assist     Locomotion Ambulation   Ambulation assist      Assist level: Contact  Guard/Touching assist Assistive device: Walker-rolling Max distance: 10ft   Walk 10 feet activity   Assist     Assist level: Contact Guard/Touching assist Assistive device: Walker-rolling   Walk 50 feet activity   Assist Walk 50 feet with 2 turns activity did not occur: Safety/medical concerns (pain, fatigue)  Assist level: Contact Guard/Touching assist Assistive device: Walker-rolling    Walk 150 feet activity   Assist Walk 150 feet activity did not occur: Safety/medical concerns (pain, fatigue)         Walk 10 feet on uneven surface  activity   Assist     Assist level: Minimal Assistance - Patient > 75% Assistive device: Walker-rolling   Wheelchair     Assist Is the patient using a wheelchair?: Yes Type of Wheelchair: Manual    Wheelchair assist level: Dependent - Patient 0% Max wheelchair distance: >27ft    Wheelchair 50 feet with 2 turns activity    Assist        Assist Level: Dependent - Patient 0%   Wheelchair 150 feet activity     Assist      Assist Level: Dependent - Patient 0%   Blood pressure (!) 143/61, pulse 72, temperature (!) 97.4 F (36.3 C), resp. rate 18, height 5\' 3"  (1.6 m), weight 59 kg, SpO2 100%.   Medical Problem List and Plan: 1. Functional deficits secondary to right femoral fx s/p cephalomedullary nail- with WBAT             -patient may  shower- cover incision             -ELOS/Goals: 7-10 days mod I to supervision             Grounds pass ordered  2. Impaired mobility, PT note reviewed and is ambulating 92 feet, continue lovenox             -antiplatelet therapy: ASA 81 mg prn mild for pain.  .  3. Pain Management:  MSIR prn for severe pain and tramadol for moderate pain prn. ain. Provided list of foods for pain  4. Mood/Behavior/Sleep: LCSW to follow for evaluation and support.              -antipsychotic agents: N/A 5. Neuropsych/cognition: This patient is capable of making decisions on her own  behalf. 6. Skin/Wound Care: Routine pressure relief measures monitor incision for healing.  7. Fluids/Electrolytes/Nutrition: Monitor I/O. Check CMET in am 8. Right femur Fx s/p IM nail: WBAT. D3 started  9. RA:  Managed with sulfasalazine with folic acid daily. Followed by Dr. Alver Fisher             --d/c Naprosyn prn --was causing GI distress. Continue Voltaren gel prn.   10. ABLA: Recheck CBC in am. Stool occult ordered  11. Dyspnea on exertion: CXR ordered and discussed that there is no acute cardiopulmonary process  LOS: 2 days A FACE TO FACE EVALUATION WAS PERFORMED  Batsheva Stevick P Jabriel Vanduyne 06/28/2023, 11:00 AM

## 2023-06-28 NOTE — Progress Notes (Signed)
 Physical Therapy Session Note  Patient Details  Name: Bailey Petersen MRN: [id number] Date of Birth: [birth year]  Today's Date: 06/28/2023 PT Individual Time: 0731-0827 PT Individual Time Calculation (min): 56 min   Short Term Goals: Week 1:  PT Short Term Goal 1 (Week 1): STG=LTG due to LOS  Skilled Therapeutic Interventions/Progress Updates:   Received pt sitting in [facility] ready for therapy. Pt agreeable to PT treatment, and reported pain in R hip (premedicated) - provided pt with ice pack at end of session. Session with emphasis on functional mobility/transfers, generalized strengthening and endurance, dynamic standing balance/coordination, and gait training.   Pt performed WC mobility 168ft using BUE and supervision with emphasis on BUE strength, then transported remainder of way to dayroom dependently for time management purposes. Pt performed all transfers with RW and CGA throughout session. Pt transferred into supine on wedge with light min A for RLE management. Pt performed the following exercises with emphasis on UE/LE strength and ROM with cues for technique: -RLE active assisted heel slides 2x15 -RLE active assisted hip abduction 2x15 -RLE active assisted SLR 2x10 -RUE serratus punches 2x10 unweighted  -R shoulder active assisted external rotation with unweighted pole 2x10x5 second stretch -chest press with 2lb dowel 2x10 - emphasis on RUE guiding & controlling movement -hip adduction ball squuezes 2x10 with 5 second isometric hold Transferred semi-reclined on wedge<>sitting EOM with supervision and increased time/effort using BUE to move RLE. Pt then ambulated 67ft with RW and CGA fading to close supervision - cues to decrease cadence and increase L step length to promote weight bearing onto RLE. Returned to room and pt sat in [facility] at sink and washed face/groomed with set up assist. Concluded session with pt sitting in WC, needs within reach, and chair pad alarm on.   Therapy  Documentation Precautions:  Precautions Precautions: Fall Precaution/Restrictions Comments: recent R TSA on 05/10/23 without restrictions Restrictions Weight Bearing Restrictions Per Provider Order: Yes RLE Weight Bearing Per Provider Order: Weight bearing as tolerated  Therapy/Group: Individual Therapy Marlana Salvage Zaunegger Blima Rich PT, DPT 06/28/2023, 6:52 AM

## 2023-06-28 NOTE — Progress Notes (Signed)
 Occupational Therapy Session Note  Patient Details  Name: Bailey Petersen MRN: [id number] Date of Birth: October 31, 1940  Today's Date: 06/28/2023 OT Individual Time: [phone] OT Individual Time Calculation (min): 55 min    Short Term Goals: Week 1:  OT Short Term Goal 1 (Week 1): STG = LTG due to ELOS  Skilled Therapeutic Interventions/Progress Updates:  Skilled OT intervention completed with focus on functional mobility, sit > stands, dynamic standing balance without AD, cardiovascular endurance. Pt received seated in w/c, agreeable to session. Unrated pain reported in R knee; pre-medicated. OT offered rest breaks and repositioning throughout for pain reduction.  Pt declined self-care needs. Transported dependently in w/c <> gym. CGA sit > stand and short ambulatory transfer with RW > EOM with cues for stride length and erect posture.   Pt participated in the following dynamic standing balance and endurance tasks to promote independence and safety during BADLs and functional mobility: -placing and retrieving squigz from long mirror. Completed 1 sit > stand per 10 squigz pieces to place, then another 10 to retrieve. Required CGA, increasing to min A with fatigue for stand without AD, CGA for balance during task, extended seated rest provided for fatigue -cornhole toss activity. CGA sit > stand without AD, and CGA/min A needed for standing balance. No LOB; seated rest breaks provided for fatigue.   Seated at table top, pt completed the following activities to promote functional use of BUE needed for independence with BADLs:  Table slides on LUE/BUE (x30) -shoulder flexion -horizontal abduction -shoulder external rotation  CGA sit > stand and ambulatory transfer with RW > w/c with cues for RLE stride length and gait pattern as prior pt shuffles with flexed posture to avoid pain per report. Pt remained seated in w/c, with chair alarm on/activated, and with all needs in reach at end of  session.    Therapy Documentation Precautions:  Precautions Precautions: Fall Precaution/Restrictions Comments: recent R TSA on 05/10/23 without restrictions Restrictions Weight Bearing Restrictions Per Provider Order: Yes RLE Weight Bearing Per Provider Order: Weight bearing as tolerated    Therapy/Group: Individual Therapy  Melvyn Novas, MS, OTR/L  06/28/2023, 10:46 AM

## 2023-06-28 NOTE — Progress Notes (Signed)
 Physical Therapy Session Note  Patient Details  Name: Bailey Petersen MRN: [id number] Date of Birth: 18-Jul-1940  Today's Date: 06/28/2023 PT Individual Time: 1420-1530 PT Individual Time Calculation (min): 70 min   Short Term Goals: Week 1:  PT Short Term Goal 1 (Week 1): STG=LTG due to LOS  Skilled Therapeutic Interventions/Progress Updates:     Pt received seated in [facility] and agrees to therapy. Reports pain in RLE. PT provides rest breaks and gentle mobility to manage pain. WC transport to gym for time management. Pt stands with RW and CGA, with cues for hand placement. Pt initiates ambulation with RW and PT notes that pt is performing inadequate weight shifting into RLE, quickly progressing LLE causing firm contact and ground reaction force with initial contact. PT educates on importance of increasing stance time to strengthen RLE and prevent compensatory motor patterns or overuse injury of LLE. PT provides minA tactile cueing at Rt thigh to promote knee extension, as well as Rt lateral weight shifting. Pt ambulates x30' with RW prior to rest break. Pt completes 1x10 LAQs with emphasis on attempting full knee extension and slow, eccentric control of knee flexion. Pt performs 3x20 reps of heel raises with RW and cues for body mechanics and tactile cues for NM feedback. Pt then ambulates x40' with RW and CGA/minA, with cues at Rt distal thigh and improved reciprocal gait pattern and RLE stance time.   Pt participates in dance aerobics activity, performing coordinated upper and lower extremity movements with visual and auditory feedback. PT provides cues for desired movements and body mechanics.   Pt left seated in WC with alarm intact and all needs within reach.   Therapy Documentation Precautions:  Precautions Precautions: Fall Precaution/Restrictions Comments: recent R TSA on 05/10/23 without restrictions Restrictions Weight Bearing Restrictions Per Provider Order: Yes RLE Weight Bearing Per  Provider Order: Weight bearing as tolerated  Therapy/Group: Individual Therapy  Beau Fanny, PT, DPT 06/28/2023, 4:07 PM

## 2023-06-29 ENCOUNTER — Telehealth (HOSPITAL_BASED_OUTPATIENT_CLINIC_OR_DEPARTMENT_OTHER): Payer: Self-pay | Admitting: Orthopaedic Surgery

## 2023-06-29 ENCOUNTER — Telehealth: Payer: Self-pay

## 2023-06-29 DIAGNOSIS — S7291XS Unspecified fracture of right femur, sequela: Secondary | ICD-10-CM | POA: Diagnosis not present

## 2023-06-29 NOTE — Telephone Encounter (Signed)
 Bailey Petersen with [facility].would like to know if there is a certain protocol for patient's right shoulder and any limitations.  Cb# [phone].  Please advise.  Thank you.

## 2023-06-29 NOTE — Progress Notes (Signed)
 PROGRESS NOTE   Subjective/Complaints: No new complaints this morning IPOC completed She asks about CPM machine to improved her hip and knee ROM  ROS: +right hip pain, +shortness of breath, decreased range of motion of right knee   Objective:   DG Chest 2 View Result Date: 06/27/2023 CLINICAL DATA:  Shortness of breath EXAM: CHEST - 2 VIEW COMPARISON:  Chest x-ray 06/25/2022.  CT of the chest 06/25/2022. FINDINGS: The heart size and mediastinal contours are within normal limits. Faint nodular density seen in the right upper lobe measuring 5 mm. The lungs are otherwise clear. There is no pleural effusion or pneumothorax. The cardiomediastinal silhouette is within normal limits. Right shoulder arthroplasty is present. There is a proximal left humeral fixation plate and screws. IMPRESSION: 1. No active cardiopulmonary disease. 2. Faint nodular density in the right upper lobe measuring 5 mm. This can be followed up with chest CT as clinically warranted. Electronically Signed   By: Darliss Cheney M.D.   On: 06/27/2023 19:36   Recent Labs    06/27/23 0532 06/28/23 0611  WBC 5.5 5.2  HGB 7.8* 8.7*  HCT 25.2* 28.8*  PLT 198 242   Recent Labs    06/27/23 0532 06/28/23 0611  NA 141 142  K 3.9 4.2  CL 106 107  CO2 25 26  GLUCOSE 87 99  BUN 13 12  CREATININE 0.58 0.69  CALCIUM 8.5* 8.9    Intake/Output Summary (Last 24 hours) at 06/29/2023 1009 Last data filed at 06/29/2023 0721 Gross per 24 hour  Intake 720 ml  Output --  Net 720 ml        Physical Exam: Vital Signs Blood pressure 132/62, pulse 93, temperature 98.4 F (36.9 C), resp. rate 18, height 5\' 3"  (1.6 m), weight 59 kg, SpO2 96%. Gen: no distress, normal appearing HEENT: oral mucosa pink and moist, NCAT Cardio: Reg rate Chest: normal effort, normal rate of breathing Abd: soft, non-distended Musculoskeletal:     Cervical back: Neck supple. No tenderness.      Comments: Edema right hip/right thigh. Multiple dressings right thigh to the knee.    UE strength 5/5 except Grip and FA 5-/5 B/L LLE 5/5 in HF, KE, KF, DF and PF RLE- HF and KE 2-/5 limited due to pain; distally 5/5  Skin:    General: Skin is warm and dry.     Comments: Dressings on R thigh laterally- moderate edema  Neurological:     Mental Status: She is alert and oriented to person, place, and time.     Comments: Intact to light touch in all 4 extremities, stable 2/28   Psychiatric:        Mood and Affect: Mood normal.        Behavior: Behavior normal.    Assessment/Plan: 1. Functional deficits which require 3+ hours per day of interdisciplinary therapy in a comprehensive inpatient rehab setting. Physiatrist is providing close team supervision and 24 hour management of active medical problems listed below. Physiatrist and rehab team continue to assess barriers to discharge/monitor patient progress toward functional and medical goals  Care Tool:  Bathing    Body parts bathed by patient: Right arm,  Left arm, Abdomen, Chest, Front perineal area, Buttocks, Right upper leg, Left upper leg, Left lower leg, Face   Body parts bathed by helper: Right lower leg     Bathing assist Assist Level: Minimal Assistance - Patient > 75%     Upper Body Dressing/Undressing Upper body dressing   What is the patient wearing?: Pull over shirt    Upper body assist Assist Level: Set up assist    Lower Body Dressing/Undressing Lower body dressing      What is the patient wearing?: Underwear/pull up, Pants     Lower body assist Assist for lower body dressing: Minimal Assistance - Patient > 75%     Toileting Toileting    Toileting assist Assist for toileting: Minimal Assistance - Patient > 75%     Transfers Chair/bed transfer  Transfers assist     Chair/bed transfer assist level: Contact Guard/Touching assist     Locomotion Ambulation   Ambulation assist      Assist  level: Contact Guard/Touching assist Assistive device: Walker-rolling Max distance: 55ft   Walk 10 feet activity   Assist     Assist level: Contact Guard/Touching assist Assistive device: Walker-rolling   Walk 50 feet activity   Assist Walk 50 feet with 2 turns activity did not occur: Safety/medical concerns (pain, fatigue)  Assist level: Contact Guard/Touching assist Assistive device: Walker-rolling    Walk 150 feet activity   Assist Walk 150 feet activity did not occur: Safety/medical concerns (pain, fatigue)         Walk 10 feet on uneven surface  activity   Assist     Assist level: Minimal Assistance - Patient > 75% Assistive device: Walker-rolling   Wheelchair     Assist Is the patient using a wheelchair?: Yes Type of Wheelchair: Manual    Wheelchair assist level: Dependent - Patient 0% Max wheelchair distance: >73ft    Wheelchair 50 feet with 2 turns activity    Assist        Assist Level: Dependent - Patient 0%   Wheelchair 150 feet activity     Assist      Assist Level: Dependent - Patient 0%   Blood pressure 132/62, pulse 93, temperature 98.4 F (36.9 C), resp. rate 18, height 5\' 3"  (1.6 m), weight 59 kg, SpO2 96%.   Medical Problem List and Plan: 1. Functional deficits secondary to right femoral fx s/p cephalomedullary nail- with WBAT             -patient may  shower- cover incision             -ELOS/Goals: 7-10 days mod I to supervision             Grounds pass ordered  2. Impaired mobility, PT note reviewed and is ambulating 92 feet, continue lovenox             -antiplatelet therapy: ASA 81 mg prn mild for pain.  .  3. Pain Management:  MSIR prn for severe pain and tramadol for moderate pain prn. ain. Provided list of foods for pain  4. Mood/Behavior/Sleep: LCSW to follow for evaluation and support.              -antipsychotic agents: N/A 5. Neuropsych/cognition: This patient is capable of making decisions on  her own behalf. 6. Skin/Wound Care: Routine pressure relief measures monitor incision for healing.  7. Fluids/Electrolytes/Nutrition: Monitor I/O. Check CMET in am 8. Right femur Fx s/p IM nail: WBAT. D3 started  9.  RA: Managed with sulfasalazine with folic acid daily. Followed by Dr. Alver Fisher             --d/c Naprosyn prn --was causing GI distress. continue Voltaren gel prn.   10. ABLA: Discussed that hemoglobin improved on repeat  11. Dyspnea on exertion: CXR ordered and discussed that there is no acute cardiopulmonary process. Incentive spirometer ordered  12. Decreased range of motion of right knee: will check with ortho whether we can use CPM machine as per her request  13. S/p right reverse shoulder arthroplasty: will check with Dr. August Saucer if there is a recommended protocol for shoulder range of motion  LOS: 3 days A FACE TO FACE EVALUATION WAS PERFORMED  Jatinder Mcdonagh P Tamme Mozingo 06/29/2023, 10:09 AM

## 2023-06-29 NOTE — Progress Notes (Signed)
 Occupational Therapy Session Note  Patient Details  Name: Bailey Petersen MRN: [id number] Date of Birth: 01-27-1941  Today's Date: 06/29/2023 OT Individual Time: 1120-1200 OT Individual Time Calculation (min): 40 min    Short Term Goals: Week 1:  OT Short Term Goal 1 (Week 1): STG = LTG due to ELOS  Skilled Therapeutic Interventions/Progress Updates:    Pt received sitting up in the w/c with 2/10 pain in her R hip, ice on knee as well, reporting no need for pain intervention, agreeable to session. Pt was taken via w/c to the therapy gym for time management. She completed standing level weight shifting R/L preparatory activity. She then complete static marching activity to address hip flexion strengthening/AROM and weightbearing through RLE. (S) provided, pt able to appropriately brace on RW. First trial she required knee guarding with min A, second trial only CGA. 3rd trial, no guarding required. Edu provided on carryover to home and benefits of warming up prior to movement. She then completed BUE strengthening/AROM with focus on the RUE. Holding a 2lb dowel with pec activation she completed 0-100 degrees closed chain reaching. 3x10 repetitions for carryover to improved UE strength/AROM during ADLs. 50 ft of functional mobility with CGA, slow pace with the RW. She returned to her room following. Pt was left sitting up in the w/c with all needs met and call bell within reach.   Therapy Documentation Precautions:  Precautions Precautions: Fall Precaution/Restrictions Comments: recent R TSA on 05/10/23 without restrictions Restrictions Weight Bearing Restrictions Per Provider Order: Yes RLE Weight Bearing Per Provider Order: Weight bearing as tolerated     Therapy/Group: Individual Therapy  Crissie Reese 06/29/2023, 6:22 AM

## 2023-06-29 NOTE — Telephone Encounter (Signed)
 Bailey Petersen in patient (c) [phone]  wants to know is they can use cpl  patient has a lot of swelling  patient is in room 79m07

## 2023-06-29 NOTE — Plan of Care (Signed)
  Problem: Consults Goal: RH GENERAL PATIENT EDUCATION Description: See Patient Education module for education specifics. Outcome: Progressing   Problem: RH BOWEL ELIMINATION Goal: RH STG MANAGE BOWEL WITH ASSISTANCE Description: STG Manage Bowel with toileting Assistance. Outcome: Progressing Goal: RH STG MANAGE BOWEL W/MEDICATION W/ASSISTANCE Description: STG Manage Bowel with Medication with mod I Assistance. Outcome: Progressing   Problem: RH SAFETY Goal: RH STG ADHERE TO SAFETY PRECAUTIONS W/ASSISTANCE/DEVICE Description: STG Adhere to Safety Precautions With cues Assistance/Device. Outcome: Progressing   Problem: RH PAIN MANAGEMENT Goal: RH STG PAIN MANAGED AT OR BELOW PT'S PAIN GOAL Description: Pain < 4 with prns Outcome: Progressing   Problem: RH KNOWLEDGE DEFICIT GENERAL Goal: RH STG INCREASE KNOWLEDGE OF SELF CARE AFTER HOSPITALIZATION Description: Patient and spouse will be able to manage care at discharge using educational resources independently Outcome: Progressing

## 2023-06-29 NOTE — Telephone Encounter (Signed)
 Patient had right reverse shoulder arthroplasty in January and she had recent hip fracture treated by Dr. Steward Drone on 06/24/2023.  Looks like she is weightbearing as tolerated with walker.  She is okay for full active and passive range of motion of the right shoulder with no external rotation past 30 degrees.  Okay for lifting up to 5 pounds with the right arm at this point.  I am cc'ing Dr. August Saucer on this just in case he wants anything special for her shoulder when she is using her walker.

## 2023-06-29 NOTE — Plan of Care (Signed)
  Problem: Consults Goal: RH GENERAL PATIENT EDUCATION Description: See Patient Education module for education specifics. Outcome: Progressing   Problem: RH BOWEL ELIMINATION Goal: RH STG MANAGE BOWEL WITH ASSISTANCE Description: STG Manage Bowel with toileting Assistance. Outcome: Progressing   Problem: RH BOWEL ELIMINATION Goal: RH STG MANAGE BOWEL W/MEDICATION W/ASSISTANCE Description: STG Manage Bowel with Medication with mod I Assistance. Outcome: Progressing   Problem: RH SAFETY Goal: RH STG ADHERE TO SAFETY PRECAUTIONS W/ASSISTANCE/DEVICE Description: STG Adhere to Safety Precautions With cues Assistance/Device. Outcome: Progressing   Problem: RH PAIN MANAGEMENT Goal: RH STG PAIN MANAGED AT OR BELOW PT'S PAIN GOAL Description: Pain < 4 with prns Outcome: Progressing   Problem: RH KNOWLEDGE DEFICIT GENERAL Goal: RH STG INCREASE KNOWLEDGE OF SELF CARE AFTER HOSPITALIZATION Description: Patient and spouse will be able to manage care at discharge using educational resources independently Outcome: Progressing

## 2023-06-29 NOTE — Progress Notes (Signed)
 Occupational Therapy Session Note  Patient Details  Name: Bailey Petersen MRN: [id number] Date of Birth: June 10, 1940  Today's Date: 06/29/2023 OT Individual Time: 1005-1100 OT Individual Time Calculation (min): 55 min    Short Term Goals: Week 1:  OT Short Term Goal 1 (Week 1): STG = LTG due to ELOS  Skilled Therapeutic Interventions/Progress Updates:  Skilled OT intervention completed with focus on functional transfers, RLE/RUE ROM, cardiovascular endurance. Pt received seated in w/c, agreeable to session. 7/10 R knee pain reported; nurse notified however pre-medicated. OT offered topical ice, rest breaks and repositioning throughout for pain reduction.  Pt declined self-care needs as she showered prior to OT session with nursing. Transported dependently in w/c <> gym.  Pt completed the following intervals on nustep to promote RLE ROM, edema reduction and global/cardiovascular endurance needed for independence with BADLs and functional mobility: -5 mins, level 2 -10 mins, level 2 Intermittent rest break needed for fatigue however pt requesting additional time with activity due to relief from pain received  CGA sit > stand and ambulatory transfer with RW > EOM with cues for stepping pattern on R side and erect posture. Seated EOM, pt completed the following exercises to improve functional ROM in a pain free zone needed for independence with BADLs:  AAROM (with PVC pipe) -x15 BUE shoulder flexion -x15 shoulder external rotation -x15 shoulder abduction  CGA stand pivot transfer with RW > w/c. Pt remained seated in w/c, with chair alarm on/activated, and with all needs in reach at end of session.   Therapy Documentation Precautions:  Precautions Precautions: Fall Precaution/Restrictions Comments: recent R TSA on 05/10/23 without restrictions Restrictions Weight Bearing Restrictions Per Provider Order: Yes RLE Weight Bearing Per Provider Order: Weight bearing as  tolerated    Therapy/Group: Individual Therapy  Roston Grunewald E Catheryn Slifer, MS, OTR/L  06/29/2023, 11:13 AM

## 2023-06-29 NOTE — Discharge Instructions (Addendum)
 Inpatient Rehab Discharge Instructions  Bailey Petersen Discharge date and time:  07/04/23  Activities/Precautions/ Functional Status: Activity: no lifting, driving, or strenuous exercise till cleared by MD Diet: regular diet Wound Care: keep wound clean and dry. Contact Dr. Steward Drone  if you develop any problems with your incision/wound--redness, swelling, increase in pain, drainage or if you develop fever or chills.    Functional status:  ___ No restrictions     ___ Walk up steps independently ___ 24/7 supervision/assistance   ___ Walk up steps with assistance _X__ Intermittent supervision/assistance  ___ Bathe/dress independently _X__ Walk with walker    ___ Bathe/dress with assistance ___ Walk Independently    ___ Shower independently ___ Walk with assistance    __X_ Shower with assistance ___ No alcohol     ___ Return to work/school ________  Special Instructions: Wear support stocking daily--don in am and doff in pm. Keep your legs elevated to help control swelling (which in turn will put you at risk for blood clots)   COMMUNITY REFERRALS UPON DISCHARGE:    Outpatient: PT             Agency:[facility] [address] [place] [address]  [phone]              Appointment Date/Time:WILL CALL TO SET UP FOLLOW UP APPOINTMENTS  Medical Equipment/Items Ordered:WHEELCHAIR                                                 Agency/Supplier:[facility]   [phone]    My questions have been answered and I understand these instructions. I will adhere to these goals and the provided educational materials after my discharge from the hospital.  Patient/Caregiver Signature _______________________________ Date __________  Clinician Signature _______________________________________ Date __________  Please bring this form and your medication list with you to all your follow-up doctor's appointments.

## 2023-06-29 NOTE — IPOC Note (Signed)
 Overall Plan of Care [facility]) Patient Details Name: Bailey Petersen MRN: [id number] DOB: 1940-08-08  Admitting Diagnosis: Closed fracture of right femur, unspecified fracture morphology, sequela  Hospital Problems: Principal Problem:   Closed fracture of right femur, unspecified fracture morphology, sequela     Functional Problem List: Nursing Bowel, Safety, Pain, Endurance, Medication Management  PT Balance, Edema, Endurance, Pain, Skin Integrity  OT Balance, Edema, Endurance, Motor, Pain  SLP    TR         Basic ADL's: OT Bathing, Toileting, Dressing     Advanced  ADL's: OT Simple Meal Preparation     Transfers: PT Bed Mobility, Bed to Chair, Car, Occupational psychologist, Research scientist (life sciences): PT Ambulation, Psychologist, prison and probation services, Stairs     Additional Impairments: OT Fuctional Use of Upper Extremity  SLP        TR      Anticipated Outcomes Item Anticipated Outcome  Self Feeding Independent  Swallowing      Basic self-care  Mod I  Toileting  Mod I   Bathroom Transfers Mod I  Bowel/Bladder  manage bowel w mod I assist  Transfers  Mod I with LRAD  Locomotion  Mod I with LRAD  Communication     Cognition     Pain  pain < 4 with prns  Safety/Judgment  manage safety w cues   Therapy Plan: PT Intensity: Minimum of 1-2 x/day ,45 to 90 minutes PT Frequency: 5 out of 7 days PT Duration Estimated Length of Stay: 7 days OT Intensity: Minimum of 1-2 x/day, 45 to 90 minutes OT Frequency: 5 out of 7 days OT Duration/Estimated Length of Stay: 7 days     Team Interventions: Nursing Interventions Patient/Family Education, Pain Management, Medication Management, Discharge Planning, Bowel Management, Disease Management/Prevention  PT interventions Ambulation/gait training, Disease management/prevention, Pain management, Stair training, Warden/ranger, DME/adaptive equipment instruction, Patient/family education, Therapeutic Activities, Wheelchair  propulsion/positioning, Functional electrical stimulation, Psychosocial support, Therapeutic Exercise, Community reintegration, Functional mobility training, Skin care/wound management, UE/LE Strength taining/ROM, Discharge planning, Neuromuscular re-education, Splinting/orthotics, UE/LE Coordination activities  OT Interventions Warden/ranger, DME/adaptive equipment instruction, Patient/family education, Therapeutic Activities, Wheelchair propulsion/positioning, Therapeutic Exercise, Community reintegration, Functional mobility training, Self Care/advanced ADL retraining, UE/LE Strength taining/ROM, Discharge planning, Neuromuscular re-education, Skin care/wound managment, UE/LE Coordination activities, Disease mangement/prevention, Pain management  SLP Interventions    TR Interventions    SW/CM Interventions Discharge Planning, Psychosocial Support, Patient/Family Education   Barriers to Discharge MD  Medical stability  Nursing Decreased caregiver support, Home environment access/layout 2 level 1 ste left rail main B+B w spouse  PT Home environment access/layout, Wound Care, Other (comments) pain, 1 STE with no rails, RLE weakness, fatigue  OT Inaccessible home environment, Home environment access/layout, Wound Care    SLP      SW       Team Discharge Planning: Destination: PT-Home ,OT- Home , SLP-  Projected Follow-up: PT-Outpatient PT, OT-  Home health OT, SLP-  Projected Equipment Needs: PT-Other (comment), OT- To be determined, SLP-  Equipment Details: PT-has RW, OT-  Patient/family involved in discharge planning: PT- Patient,  OT-Patient, SLP-   MD ELOS: 7-10 days Medical Rehab Prognosis:  Excellent Assessment: The patient has been admitted for CIR therapies with the diagnosis of right femoral head fracture. The team will be addressing functional mobility, strength, stamina, balance, safety, adaptive techniques and equipment, self-care, bowel and bladder mgt, patient  and caregiver education. Goals have been set at modI. Anticipated discharge  destination is home.        See Team Conference Notes for weekly updates to the plan of care

## 2023-06-29 NOTE — Progress Notes (Signed)
 Physical Therapy Session Note  Patient Details  Name: Bailey Petersen MRN: [id number] Date of Birth: 1940-12-04  Today's Date: 06/29/2023 PT Individual Time: 0800-0843 PT Individual Time Calculation (min): 43 min   Short Term Goals: Week 1:  PT Short Term Goal 1 (Week 1): STG=LTG due to LOS  Skilled Therapeutic Interventions/Progress Updates:      Pt sitting in wheelchair to start - reports lower rated R hip pain - reports she received pain Rx recently. Mobility and repositioning provided for pain management.   Transported at wheelchair level to main rehab gym. Patient requesting to work on RLE strengthening. Stand pivot transfer with RW and CGA to mat table.   -Seated LAQ + extension holds  -Seated hamstring curls with pillow case under foot to reduce friction -Seated heel raises -Seated active hamstring stretch -Standing hip abd + hip flex combo -standing mini-squats without UE support -Repeated sit<>stands with RW support -Standing TKE on R with red TB resistance *2x10 each. Pt cueing for activation, sequencing, and posture  Gait training x23ft with CGA and RW - cues for increasing stance time on R to reduce antalgic and unbalanced gait cycle.   Returned to her room and patient left sitting upright in wheelchair. Chair pad alarm on and all her needs met.    Therapy Documentation Precautions:  Precautions Precautions: Fall Precaution/Restrictions Comments: recent R TSA on 05/10/23 without restrictions Restrictions Weight Bearing Restrictions Per Provider Order: Yes RLE Weight Bearing Per Provider Order: Weight bearing as tolerated General:     Therapy/Group: Individual Therapy  Orrin Brigham 06/29/2023, 7:50 AM

## 2023-06-29 NOTE — Progress Notes (Signed)
 Physical Therapy Session Note  Patient Details  Name: Bailey Petersen MRN: [id number] Date of Birth: 1940-08-21  Today's Date: 06/29/2023 PT Individual Time: 1358-1455 PT Individual Time Calculation (min): 57 min   Short Term Goals: Week 1:  PT Short Term Goal 1 (Week 1): STG=LTG due to LOS  Skilled Therapeutic Interventions/Progress Updates:   Received pt sitting on commode with RN - PT took over with care. Pt agreeable to PT treatment and denied any pain at rest, increasing to 3/10 with mobility (premedicated). Session with emphasis on functional mobility/transfers, generalized strengthening and endurance, dynamic standing balance/coordination, gait training, and curb navigation.   Pt performed all transfers with RW and CGA/close supervision throughout session. Pt able to void and perform hygiene management without assist. Transferred bedside commode<>WC via stand<>pivot with grab bar and close supervision and pt transported to/from room in [facility] dependently for time management purposes. Demonstrated technique for curb navigation to simulate home entry and pt ascended/descended 1 6in curb with RW and min A x 3 trials with cues for "up with the good, down with the bad" technique.   Stood from [facility] with RW and CGA and without RW and min A and worked on static standing balance/coordination, reaction time, and placing full weight through BLE tossing/catching ball against rebounder for 1 minute x 4 trials with CGA for balance. During active seated rest break, performed 2x15 active assisted R knee flexion with emphasis on ROM. Transitioned to sit<>stands from elevated EOM without UE support and min A fading to CGA 1x6 reps and 1x5 - emphasis on glute/quad strength. Pt then ambulated 121ft with RW and CGA fading to close supervision - pt demo improvements in L step length and weight bearing onto RLE. Pt required multiple rest breaks throughout and reported feeling SOB after ambulating - HR 94bpm and SPO2  100%. Pt then ambulated additional 24ft with RW and close supervision back towards room - limited by increased back and transported remainder of way dependently. Concluded session with pt sitting in WC, needs within reach, and chair pad alarm on. Provided pt with ice pack for R knee.   Therapy Documentation Precautions:  Precautions Precautions: Fall Precaution/Restrictions Comments: recent R TSA on 05/10/23 without restrictions Restrictions Weight Bearing Restrictions Per Provider Order: Yes RLE Weight Bearing Per Provider Order: Weight bearing as tolerated  Therapy/Group: Individual Therapy Marlana Salvage Zaunegger Blima Rich PT, DPT 06/29/2023, 6:49 AM

## 2023-06-30 DIAGNOSIS — S7291XS Unspecified fracture of right femur, sequela: Secondary | ICD-10-CM | POA: Diagnosis not present

## 2023-06-30 NOTE — Progress Notes (Signed)
 Occupational Therapy Session Note  Patient Details  Name: Bailey Petersen MRN: [id number] Date of Birth: Apr 03, 1941  Today's Date: 06/30/2023 OT Individual Time: 1300-1340 OT Individual Time Calculation (min): 40 min    Short Term Goals: Week 1:  OT Short Term Goal 1 (Week 1): STG = LTG due to ELOS  Skilled Therapeutic Interventions/Progress Updates:    OT intervention with focus on BLE/BUE therex for general conditiioning and BLE strengthing, and standing acitvities without UE support to increase pt's independence with BADLs. NuStep 2x5 mins level 2. Pt reports the exercise helps her Rt hip/RLE stretch out and easier to move. Standing activities at BITS 4x1 min with rest breaks. Pt standing with no UE support and no LOB noted. Pt returned to room and remained in w/c. All needs within reach. Seat alarm activated.   Therapy Documentation Precautions:  Precautions Precautions: Fall Precaution/Restrictions Comments: recent R TSA on 05/10/23 without restrictions Restrictions Weight Bearing Restrictions Per Provider Order: Yes RLE Weight Bearing Per Provider Order: Weight bearing as tolerated   Pain: Pt c/o 4/10 Rt hip pain; exercise/activity and meds admin at end of session     Therapy/Group: Individual Therapy  Rich Brave 06/30/2023, 1:45 PM

## 2023-06-30 NOTE — Progress Notes (Signed)
 PROGRESS NOTE   Subjective/Complaints: No new complaints this morning Discussed that her orthopedic physician does not want her to use CPM for her right leg  ROS: +right hip pain- stable, +shortness of breath, decreased range of motion of right knee   Objective:   No results found.  Recent Labs    06/28/23 0611  WBC 5.2  HGB 8.7*  HCT 28.8*  PLT 242   Recent Labs    06/28/23 0611  NA 142  K 4.2  CL 107  CO2 26  GLUCOSE 99  BUN 12  CREATININE 0.69  CALCIUM 8.9    Intake/Output Summary (Last 24 hours) at 06/30/2023 1813 Last data filed at 06/30/2023 1700 Gross per 24 hour  Intake 1018 ml  Output --  Net 1018 ml        Physical Exam: Vital Signs Blood pressure (!) 128/56, pulse 96, temperature 97.8 F (36.6 C), temperature source Oral, resp. rate 18, height 5\' 3"  (1.6 m), weight 59 kg, SpO2 98%. Gen: no distress, normal appearing HEENT: oral mucosa pink and moist, NCAT Cardio: Reg rate Chest: normal effort, normal rate of breathing Abd: soft, non-distended Musculoskeletal:     Cervical back: Neck supple. No tenderness.     Comments: Edema right hip/right thigh. Multiple dressings right thigh to the knee.    UE strength 5/5 except Grip and FA 5-/5 B/L LLE 5/5 in HF, KE, KF, DF and PF RLE- HF and KE 2-/5 limited due to pain; distally 5/5  Skin:    General: Skin is warm and dry.     Comments: Dressings on R thigh laterally- moderate edema  Neurological:     Mental Status: She is alert and oriented to person, place, and time.     Comments: Intact to light touch in all 4 extremities, stable 31   Psychiatric:        Mood and Affect: Mood normal.        Behavior: Behavior normal.    Assessment/Plan: 1. Functional deficits which require 3+ hours per day of interdisciplinary therapy in a comprehensive inpatient rehab setting. Physiatrist is providing close team supervision and 24 hour management of  active medical problems listed below. Physiatrist and rehab team continue to assess barriers to discharge/monitor patient progress toward functional and medical goals  Care Tool:  Bathing    Body parts bathed by patient: Right arm, Left arm, Abdomen, Chest, Front perineal area, Buttocks, Right upper leg, Left upper leg, Left lower leg, Face   Body parts bathed by helper: Right lower leg     Bathing assist Assist Level: Minimal Assistance - Patient > 75%     Upper Body Dressing/Undressing Upper body dressing   What is the patient wearing?: Pull over shirt    Upper body assist Assist Level: Set up assist    Lower Body Dressing/Undressing Lower body dressing      What is the patient wearing?: Underwear/pull up, Pants     Lower body assist Assist for lower body dressing: Minimal Assistance - Patient > 75%     Toileting Toileting    Toileting assist Assist for toileting: Minimal Assistance - Patient > 75%  Transfers Chair/bed transfer  Transfers assist     Chair/bed transfer assist level: Contact Guard/Touching assist     Locomotion Ambulation   Ambulation assist      Assist level: Contact Guard/Touching assist Assistive device: Walker-rolling Max distance: 140ft   Walk 10 feet activity   Assist     Assist level: Contact Guard/Touching assist Assistive device: Walker-rolling   Walk 50 feet activity   Assist Walk 50 feet with 2 turns activity did not occur: Safety/medical concerns (pain, fatigue)  Assist level: Contact Guard/Touching assist Assistive device: Walker-rolling    Walk 150 feet activity   Assist Walk 150 feet activity did not occur: Safety/medical concerns (pain, fatigue)         Walk 10 feet on uneven surface  activity   Assist     Assist level: Minimal Assistance - Patient > 75% Assistive device: Walker-rolling   Wheelchair     Assist Is the patient using a wheelchair?: Yes Type of Wheelchair: Manual     Wheelchair assist level: Dependent - Patient 0% Max wheelchair distance: >51ft    Wheelchair 50 feet with 2 turns activity    Assist        Assist Level: Dependent - Patient 0%   Wheelchair 150 feet activity     Assist      Assist Level: Dependent - Patient 0%   Blood pressure (!) 128/56, pulse 96, temperature 97.8 F (36.6 C), temperature source Oral, resp. rate 18, height 5\' 3"  (1.6 m), weight 59 kg, SpO2 98%.   Medical Problem List and Plan: 1. Functional deficits secondary to right femoral fx s/p cephalomedullary nail- with WBAT             -patient may  shower- cover incision             -ELOS/Goals: 7-10 days mod I to supervision             Grounds pass ordered  2. Impaired mobility, PT note reviewed and is ambulating 92 feet, continue lovenox             -antiplatelet therapy: ASA 81 mg prn mild for pain.  .  3. Pain Management:  MSIR prn for severe pain and tramadol for moderate pain prn. ain. Provided list of foods for pain  4. Mood/Behavior/Sleep: LCSW to follow for evaluation and support.              -antipsychotic agents: N/A 5. Neuropsych/cognition: This patient is capable of making decisions on her own behalf. 6. Skin/Wound Care: Routine pressure relief measures monitor incision for healing.  7. Fluids/Electrolytes/Nutrition: Monitor I/O. Check CMET in am  8. Right femur Fx s/p IM nail: WBAT. D3 started  9. RA: Managed with sulfasalazine with folic acid daily. Followed by Dr. Alver Fisher             --d/c Naprosyn prn --was causing GI distress. Continue Voltaren gel prn.   10. ABLA: Discussed that hemoglobin improved on repeat  11. Dyspnea on exertion: CXR ordered and discussed that there is no acute cardiopulmonary process. Incentive spirometer ordered  12. Decreased range of motion of right knee: discussed that ortho does not recommend CPM  13. S/p right reverse shoulder arthroplasty: continue unrestricted range of motion  14.  Constipation: discussed that her husband will bring inn prunes which are effective for ehr  LOS: 4 days A FACE TO FACE EVALUATION WAS PERFORMED  Drema Pry Dj Senteno 06/30/2023, 6:13 PM

## 2023-06-30 NOTE — Progress Notes (Signed)
 PT had BM, [facility] notified nurse that the hat that was in toilet had been thrown away due to patient urinating. Unable to collect sample.

## 2023-06-30 NOTE — Telephone Encounter (Signed)
 Ok to wbat thru shoulder with walker thx

## 2023-06-30 NOTE — Progress Notes (Signed)
 Previous nurse unsure if patient had BM today. Nurse asked patient. Patient denied BM. Bowel wounds hyperactive x4 quadrants. Nurse said she will bring in suppository or an enema. Patient stated, " I don't want that right now, my husband is bringing prunes tomorrow. They helped last time I had surgery and was constipated. Nurse brough 2 cups pf prune juice to patient.

## 2023-06-30 NOTE — Progress Notes (Signed)
 Occupational Therapy Session Note  Patient Details  Name: Bailey Petersen MRN: [id number] Date of Birth: 04/08/1941  Today's Date: 06/30/2023 OT Individual Time: 1030-1110 OT Individual Time Calculation (min): 40 min   Short Term Goals: Week 1:  OT Short Term Goal 1 (Week 1): STG = LTG due to ELOS  Skilled Therapeutic Interventions/Progress Updates:  Pt greeted sitting in [facility] for skilled OT session with focus on standing tolerance, RUE ROM, and RLE strengthening.   Pain: Pt reported 0/10 pain at rest, pain increasing to 3-5/10 pain with activity. OT offering intermediate rest breaks and positioning suggestions throughout session to address pain/fatigue and maximize participation/safety in session.   Functional Transfers: Sit<>stands during session with cuing for safe hand placement as patient attempts on multiple occasions to stand by pulling on RW, and sit without reaching back to seat. Ambulating toilet transfer with CGA + RW, intermediate use of grab bar for standing balance. Same level of assistance for 3/3 toileting activities.   Therapeutic Activities: In standing, pt tasked with reaching across midline to retrieve and then place rings onto vertical target, calling for ~140 degrees of R shoulder flexion. Pt completes with ease, no reports of pain, x10 reps. Activity upgraded with use of 1# wrist weight, and use of resistive clips. Pt with increased difficulty towards end of this version, reporting ". . [name] would think 1# weight would be easier." Pt reporting stretching at shoulder but no pain, completing x10 reps.   Session transitioned to functional reaching task for carryover into LB dressing, as patient placed rings on cones surrounding RLE. Pt completes 2x8 reps, again reporting stretching at limb.   Pt remained toileting due to sudden urgency, NT made aware of patient location. Immediate needs met. Pt continues to be appropriate for skilled OT intervention to promote further  functional independence in ADLs/IADLs.   Therapy Documentation Precautions:  Precautions Precautions: Fall Precaution/Restrictions Comments: recent R TSA on 05/10/23 without restrictions Restrictions Weight Bearing Restrictions Per Provider Order: Yes RLE Weight Bearing Per Provider Order: Weight bearing as tolerated   Therapy/Group: Individual Therapy  Lou Cal, OTR/L, MSOT  06/30/2023, 6:14 AM

## 2023-06-30 NOTE — Progress Notes (Signed)
 Physical Therapy Session Note  Patient Details  Name: DEEANNE Petersen MRN: [id number] Date of Birth: Nov 16, 1940  Today's Date: 06/30/2023 PT Individual Time: 4098-1191 + 4782-9562 PT Individual Time Calculation (min): 55 min  + 70 min  Short Term Goals: Week 1:  PT Short Term Goal 1 (Week 1): STG=LTG due to LOS  Skilled Therapeutic Interventions/Progress Updates:      1st session: Patient sitting in wheelchair sinkside, finishing up a few self care tasks. Pt reports 3/10 R hip pain, gentle mobility, stretching, and repositioning provided for pain management.  Transported to main rehab gym at wheelchair level for energy conservation. Stand pivot transfer using RW with CGA to mat table.   Reviewed ortho MD note with patient to clarify restrictions - WBAT with RUE, ok to lift up to 5#, and no external rotation > 30deg. Pt voiced understanding and appreciative of update.   Completed seated stretching for RLE to reduce stiffness - active hamstring stretching and heel slides with her RLE up her L shin with AAROM for partial ROM.   Standing there-e completed in // bars for RLE strengthening: -1x15 bilateral heel raises -1x15 hip abd/add on R -1x15 high knee marching on R -1x15 hamstring curls on R -1x15 hip ext on R -1x8 repeated L<>R side stepping -1x12 alternating toe taps to 4" platform  *PT cueing for sequencing, posture, technique throughout exercises. Intermittent seated rest breaks needed for recovery 2/2 pain and fatigue.   Pt assisted on Nustep with CGA stand pivot transfer using her RW - setupA needed for RLE management. Completed 10.5 minutes at L6 resistance - encouraged full AROM on her R but also to her tolerance to promote stretching and joint mobility. She achieved a total of 396 steps.   Returned to patient's room and she was left sitting up in wheelchair with all her needs met. RN notified of patient request for pain Rx.    2nd session: Pt sitting in wheelchair - has  minimal complaints of RLE pain, reports she received pain Rx recently from nursing.   Transported at wheelchair level to main rehab gym. Sit<>stand to RW with CGA. Gait training completed x159ft with CGA and RW - demonstrates antalgic gait on RLE with decreased stance time - cues for correcting and slowing down pace.   Worked in // bars on RLE strengthening with BUE support: -1x10 mini squats -2x15 heel raises -2x8 lateral step ups to 2" step -2x8 forward step ups to 2" step -standing overhead reach with beach ball -standing trunk twists with beach ball -feet apart on blue airex pad eyes open/eyes closed -feet together on blue ariex pad eyes open/eyes closed -feet semi-tandem on lbue airex pad   eyes open/eyes closed  Mat table there-ex for BLE strengthening: -2x10 bridges -2x10 heel slides on R -2x12 SAQ w/ bolster and 2.5# ankle weight on R -2x15 hip abd/add in hooklying with red TB around knees  Finished session on [place] at wheelchair level - completed at L30cm/sec resistance x5 minutes with emphasis on self controlled AAROM for R hip.   Returned to her room and patient left sitting upright in wheelchair with all her needs met.      Therapy Documentation Precautions:  Precautions Precautions: Fall Precaution/Restrictions Comments: recent R TSA on 05/10/23 without restrictions Restrictions Weight Bearing Restrictions Per Provider Order: Yes RLE Weight Bearing Per Provider Order: Weight bearing as tolerated General:     Therapy/Group: Individual Therapy  Taite Schoeppner P Cyrus Ramsburg  PT, DPT, CSRS  06/30/2023, 7:42  AM

## 2023-07-01 DIAGNOSIS — S7291XS Unspecified fracture of right femur, sequela: Secondary | ICD-10-CM | POA: Diagnosis not present

## 2023-07-01 MED ORDER — MAGNESIUM HYDROXIDE 400 MG/5ML PO SUSP
30.0000 mL | Freq: Once | ORAL | Status: AC
  Administered 2023-07-01: 30 mL via ORAL
  Filled 2023-07-01: qty 30

## 2023-07-01 NOTE — Progress Notes (Signed)
 PROGRESS NOTE   Subjective/Complaints: No new complaints this morning, sleepy [name] chart reviewed- No issues reported overnight Vitals signs stable   ROS: +right hip pain- stable, +shortness of breath- stable, decreased range of motion of right knee   Objective:   No results found.  No results for input(s): "WBC", "HGB", "HCT", "PLT" in the last 72 hours.  No results for input(s): "NA", "K", "CL", "CO2", "GLUCOSE", "BUN", "CREATININE", "CALCIUM" in the last 72 hours.   Intake/Output Summary (Last 24 hours) at 07/01/2023 1247 Last data filed at 07/01/2023 0723 Gross per 24 hour  Intake 472 ml  Output --  Net 472 ml        Physical Exam: Vital Signs Blood pressure 137/65, pulse 86, temperature (!) 97.5 F (36.4 C), temperature source Oral, resp. rate 18, height 5\' 3"  (1.6 m), weight 59 kg, SpO2 94%. Gen: no distress, normal appearing HEENT: oral mucosa pink and moist, NCAT Cardio: Reg rate Chest: normal effort, normal rate of breathing Abd: soft, non-distended Musculoskeletal:     Cervical back: Neck supple. No tenderness.     Comments: Edema right hip/right thigh. Multiple dressings right thigh to the knee.    UE strength 5/5 except Grip and FA 5-/5 B/L LLE 5/5 in HF, KE, KF, DF and PF RLE- HF and KE 2-/5 limited due to pain; distally 5/5  Skin:    General: Skin is warm and dry.     Comments: Dressings on R thigh laterally- moderate edema  Neurological:     Mental Status: She is alert and oriented to person, place, and time.     Comments: Intact to light touch in all 4 extremities, stable 3/2   Psychiatric:        Mood and Affect: Mood normal.        Behavior: Behavior normal.    Assessment/Plan: 1. Functional deficits which require 3+ hours per day of interdisciplinary therapy in a comprehensive inpatient rehab setting. Physiatrist is providing close team supervision and 24 hour management of active  medical problems listed below. Physiatrist and rehab team continue to assess barriers to discharge/monitor patient progress toward functional and medical goals  Care Tool:  Bathing    Body parts bathed by patient: Right arm, Left arm, Abdomen, Chest, Front perineal area, Buttocks, Right upper leg, Left upper leg, Left lower leg, Face   Body parts bathed by helper: Right lower leg     Bathing assist Assist Level: Minimal Assistance - Patient > 75%     Upper Body Dressing/Undressing Upper body dressing   What is the patient wearing?: Pull over shirt    Upper body assist Assist Level: Set up assist    Lower Body Dressing/Undressing Lower body dressing      What is the patient wearing?: Underwear/pull up, Pants     Lower body assist Assist for lower body dressing: Minimal Assistance - Patient > 75%     Toileting Toileting    Toileting assist Assist for toileting: Minimal Assistance - Patient > 75%     Transfers Chair/bed transfer  Transfers assist     Chair/bed transfer assist level: Contact Guard/Touching assist     Locomotion Ambulation  Ambulation assist      Assist level: Contact Guard/Touching assist Assistive device: Walker-rolling Max distance: 143ft   Walk 10 feet activity   Assist     Assist level: Contact Guard/Touching assist Assistive device: Walker-rolling   Walk 50 feet activity   Assist Walk 50 feet with 2 turns activity did not occur: Safety/medical concerns (pain, fatigue)  Assist level: Contact Guard/Touching assist Assistive device: Walker-rolling    Walk 150 feet activity   Assist Walk 150 feet activity did not occur: Safety/medical concerns (pain, fatigue)         Walk 10 feet on uneven surface  activity   Assist     Assist level: Minimal Assistance - Patient > 75% Assistive device: Walker-rolling   Wheelchair     Assist Is the patient using a wheelchair?: Yes Type of Wheelchair: Manual     Wheelchair assist level: Dependent - Patient 0% Max wheelchair distance: >74ft    Wheelchair 50 feet with 2 turns activity    Assist        Assist Level: Dependent - Patient 0%   Wheelchair 150 feet activity     Assist      Assist Level: Dependent - Patient 0%   Blood pressure 137/65, pulse 86, temperature (!) 97.5 F (36.4 C), temperature source Oral, resp. rate 18, height 5\' 3"  (1.6 m), weight 59 kg, SpO2 94%.   Medical Problem List and Plan: 1. Functional deficits secondary to right femoral fx s/p cephalomedullary nail- with WBAT             -patient may  shower- cover incision             -ELOS/Goals: 7-10 days mod I to supervision             Grounds pass ordered  D3 started  2. Impaired mobility, PT note reviewed and is ambulating 92 feet, continue lovenox             -antiplatelet therapy: ASA 81 mg prn mild for pain.  .  3. Pain Management:  MSIR prn for severe pain and tramadol for moderate pain prn. ain. Provided list of foods for pain  4. Mood/Behavior/Sleep: LCSW to follow for evaluation and support.              -antipsychotic agents: N/A 5. Neuropsych/cognition: This patient is capable of making decisions on her own behalf. 6. Skin/Wound Care: Routine pressure relief measures monitor incision for healing.  7. Fluids/Electrolytes/Nutrition: Monitor I/O. Check CMET in am  8. Right femur Fx s/p IM nail: WBAT. D3 started  9. RA: Managed with sulfasalazine with folic acid daily. Followed by Dr. Alver Fisher             --d/c Naprosyn prn --was causing GI distress. Continue Voltaren gel prn.   10. ABLA: Discussed that hemoglobin improved on repeat  11. Dyspnea on exertion: CXR ordered and discussed that there is no acute cardiopulmonary process. Incentive spirometer ordered, flutter valve ordered  12. Decreased range of motion of right knee: discussed that ortho does not recommend CPM  13. S/p right reverse shoulder arthroplasty: continue  unrestricted range of motion  14. Constipation: discussed that her husband will bring inn prunes which are effective for her, last BM 3/1, d/c bisacodyl  LOS: 5 days A FACE TO FACE EVALUATION WAS PERFORMED  Bailey Petersen Bailey Petersen 07/01/2023, 12:47 PM

## 2023-07-02 DIAGNOSIS — K5900 Constipation, unspecified: Secondary | ICD-10-CM | POA: Diagnosis not present

## 2023-07-02 DIAGNOSIS — S7291XS Unspecified fracture of right femur, sequela: Secondary | ICD-10-CM | POA: Diagnosis not present

## 2023-07-02 DIAGNOSIS — D62 Acute posthemorrhagic anemia: Secondary | ICD-10-CM

## 2023-07-02 LAB — CBC
HCT: 29 % — ABNORMAL LOW (ref 36.0–46.0)
Hemoglobin: 8.7 g/dL — ABNORMAL LOW (ref 12.0–15.0)
MCH: 27.8 pg (ref 26.0–34.0)
MCHC: 30 g/dL (ref 30.0–36.0)
MCV: 92.7 fL (ref 80.0–100.0)
Platelets: 357 10*3/uL (ref 150–400)
RBC: 3.13 MIL/uL — ABNORMAL LOW (ref 3.87–5.11)
RDW: 17.7 % — ABNORMAL HIGH (ref 11.5–15.5)
WBC: 6 10*3/uL (ref 4.0–10.5)
nRBC: 0 % (ref 0.0–0.2)

## 2023-07-02 LAB — BASIC METABOLIC PANEL
Anion gap: 11 (ref 5–15)
BUN: 12 mg/dL (ref 8–23)
CO2: 23 mmol/L (ref 22–32)
Calcium: 8.7 mg/dL — ABNORMAL LOW (ref 8.9–10.3)
Chloride: 104 mmol/L (ref 98–111)
Creatinine, Ser: 0.63 mg/dL (ref 0.44–1.00)
GFR, Estimated: >60 mL/min (ref >60)
Glucose, Bld: 98 mg/dL (ref 70–99)
Potassium: 4.3 mmol/L (ref 3.5–5.1)
Sodium: 138 mmol/L (ref 135–145)

## 2023-07-02 NOTE — Progress Notes (Signed)
 Physical Therapy Session Note  Patient Details  Name: Bailey Petersen MRN: [id number] Date of Birth: 05/29/1940  Today's Date: 07/02/2023 PT Individual Time: [phone] and [id number] PT Individual Time Calculation (min): 69 min and 41 min  Short Term Goals: Week 1:  PT Short Term Goal 1 (Week 1): STG=LTG due to LOS  Skilled Therapeutic Interventions/Progress Updates:   Treatment Session 1 Received pt sitting in WC, pt agreeable to PT treatment, and reported pain 5/10 in R knee (premedicated) - provided pt with ice pack. Session with emphasis on functional mobility/transfers, toileting, generalized strengthening and endurance, dynamic standing balance/coordination, and gait training. Pt performed all transfers with RW and close supervision throughout session.   Pt ambulated in/out of bathroom with RW and close supervision. Pt able to manage clothing, void and have BM, and perform hygiene management without assist. Pt sat in WC at sink and washed hands with set up assist. Discussed DME for discharge and pt requesting WC - requested 16x16 manual WC and RW for home and pt planning on purchasing RW on her own. Also discussed getting OPPT at facility close to her home in [place].   Pt performed WC mobility 167ft x 2 trials using BUE (BLE for ~56ft) and mod I to/from main therapy gym with emphasis on BUE strength. Pt requested to work on exercises/stretches prior to ambulating to "warm up". Pt performed the following exercises with emphasis on RLE strength/ROM: -seated R heel slides 2x20 -standing mini squats without UE support 2x10 -standing R hip abduction 2x10 -standing R hip extension 2x10 -standing R hip flexion 2x10 Suggested kicking RLE out when transitioning from stand<>sit when pain levels are high. Pt then ambulated 59ft x 2 trials with RW and supervision - pt with continued antalgic gait pattern and reported continued edema in R knee. Pt performed standing RLE toe taps to 6in step 2x15  with BUE support on RW. Worked on blocked practice sit<>stands without BUE support 2x8 with close supervision and emphasis on quad strength. Pt then ambulated over 6in hurdles ~89ft with RW and CGA with emphasis on R hip flexion ROM and weight bearing through RLE. Returned to room and concluded session with pt sitting in [facility], needs within reach, and chair pad alarm on awaiting upcoming OT session.   Treatment Session 2 Received pt sitting in WC, pt agreeable to PT treatment, and reported pain 4/10 in R hip and knee (premedicated) - provided pt with 2 ice packs at end of session. Session with emphasis on functional mobility/transfers, generalized strengthening and endurance, curb navigation, and gait training. Pt transported to/from in [facility] dependently for time management purposes.   Pt performed all transfers with RW and supervision throughout session. Pt navigated 1 6in curb with RW and CGA x 2 trials with min cues for "up with the good, down with the bad" technique. Pt with increased difficulty placing full weight onto RLE. Pt then ambulated 29ft with RW and close supervision - pt with continued antalgic gait pattern and reporting increased pain in R hip this afternoon. Transitioned into semi-reclined on wedge with supervision and performed the following exercises with emphasis on R hip strength/ROM: -R active assisted heel slides 2x12 -bridges 2x10 -hip abduction with grn TB 2x10 Transferred semi-reclined<>sitting EOM with supervision and increased time/effort using BUE to assist RLE and MD arrived for morning rounds. Returned to room and ambulated 42ft with RW and supervision to recliner. Concluded session with pt sitting in recliner with all needs within reach awaiting OT session.  Therapy Documentation Precautions:  Precautions Precautions: Fall Precaution/Restrictions Comments: recent R TSA on 05/10/23 without restrictions Restrictions Weight Bearing Restrictions Per Provider Order: Yes RLE  Weight Bearing Per Provider Order: Weight bearing as tolerated  Therapy/Group: Individual Therapy Marlana Salvage Zaunegger Blima Rich PT, DPT 07/02/2023, 6:52 AM

## 2023-07-02 NOTE — Progress Notes (Signed)
 PROGRESS NOTE   Subjective/Complaints: Working in the gym this AM. New new concerns.   ROS: Denies CP,  N/V/D/C +right hip pain- stable, +shortness of breath- stable, decreased range of motion of right knee   Objective:   No results found.  Recent Labs    07/02/23 0614  WBC 6.0  HGB 8.7*  HCT 29.0*  PLT 357    Recent Labs    07/02/23 0614  NA 138  K 4.3  CL 104  CO2 23  GLUCOSE 98  BUN 12  CREATININE 0.63  CALCIUM 8.7*     Intake/Output Summary (Last 24 hours) at 07/02/2023 1425 Last data filed at 07/02/2023 1300 Gross per 24 hour  Intake 717 ml  Output --  Net 717 ml        Physical Exam: Vital Signs Blood pressure 125/67, pulse 86, temperature 98.1 F (36.7 C), resp. rate 16, height 5\' 3"  (1.6 m), weight 59 kg, SpO2 99%. Gen: no distress, normal appearing HEENT: oral mucosa pink and moist, NCAT Cardio: RRR Chest: CTAB, normal effort, normal rate of breathing Abd: soft, non-distended, +Bowel sounds Musculoskeletal:     Cervical back: Neck supple. No tenderness.     Comments: Edema right hip/right thigh. Multiple dressings right thigh to the knee.    UE strength 5/5 except Grip and FA 5-/5 B/L LLE 5/5 in HF, KE, KF, DF and PF RLE- HF and KE 2-/5 limited due to pain; distally 5/5  Skin:    General: Skin is warm and dry.     Comments: Dressings on R thigh laterally- moderate edema  Neurological:     Mental Status: She is alert and oriented to person, place, and time.     Comments: Intact to light touch in all 4 extremities, stable 3/2   Psychiatric:        Mood and Affect: Mood normal.        Behavior: Behavior normal.    Assessment/Plan: 1. Functional deficits which require 3+ hours per day of interdisciplinary therapy in a comprehensive inpatient rehab setting. Physiatrist is providing close team supervision and 24 hour management of active medical problems listed below. Physiatrist and  rehab team continue to assess barriers to discharge/monitor patient progress toward functional and medical goals  Care Tool:  Bathing    Body parts bathed by patient: Right arm, Left arm, Abdomen, Chest, Front perineal area, Buttocks, Right upper leg, Left upper leg, Left lower leg, Face   Body parts bathed by helper: Right lower leg     Bathing assist Assist Level: Minimal Assistance - Patient > 75%     Upper Body Dressing/Undressing Upper body dressing   What is the patient wearing?: Pull over shirt    Upper body assist Assist Level: Set up assist    Lower Body Dressing/Undressing Lower body dressing      What is the patient wearing?: Underwear/pull up, Pants     Lower body assist Assist for lower body dressing: Minimal Assistance - Patient > 75%     Toileting Toileting    Toileting assist Assist for toileting: Minimal Assistance - Patient > 75%     Transfers Chair/bed transfer  Transfers  assist     Chair/bed transfer assist level: Supervision/Verbal cueing     Locomotion Ambulation   Ambulation assist      Assist level: Supervision/Verbal cueing Assistive device: Walker-rolling Max distance: 63ft   Walk 10 feet activity   Assist     Assist level: Supervision/Verbal cueing Assistive device: Walker-rolling   Walk 50 feet activity   Assist Walk 50 feet with 2 turns activity did not occur: Safety/medical concerns (pain, fatigue)  Assist level: Supervision/Verbal cueing Assistive device: Walker-rolling    Walk 150 feet activity   Assist Walk 150 feet activity did not occur: Safety/medical concerns (pain, fatigue)         Walk 10 feet on uneven surface  activity   Assist     Assist level: Minimal Assistance - Patient > 75% Assistive device: Walker-rolling   Wheelchair     Assist Is the patient using a wheelchair?: Yes Type of Wheelchair: Manual    Wheelchair assist level: Independent Max wheelchair distance: 144ft     Wheelchair 50 feet with 2 turns activity    Assist        Assist Level: Independent   Wheelchair 150 feet activity     Assist      Assist Level: Independent   Blood pressure 125/67, pulse 86, temperature 98.1 F (36.7 C), resp. rate 16, height 5\' 3"  (1.6 m), weight 59 kg, SpO2 99%.   Medical Problem List and Plan: 1. Functional deficits secondary to right femoral fx s/p cephalomedullary nail- with WBAT             -patient may  shower- cover incision             -ELOS/Goals: 7-10 days mod I to supervision             Grounds pass ordered  D3 started  2. Impaired mobility, PT note reviewed and is ambulating 92 feet, continue lovenox             -antiplatelet therapy: ASA 81 mg prn mild for pain.  .  3. Pain Management:  MSIR prn for severe pain and tramadol for moderate pain prn. ain. Provided list of foods for pain  4. Mood/Behavior/Sleep: LCSW to follow for evaluation and support.              -antipsychotic agents: N/A 5. Neuropsych/cognition: This patient is capable of making decisions on her own behalf. 6. Skin/Wound Care: Routine pressure relief measures monitor incision for healing.  7. Fluids/Electrolytes/Nutrition: Monitor I/O. Check CMET in am  8. Right femur Fx s/p IM nail: WBAT. D3 started  9. RA: Managed with sulfasalazine with folic acid daily. Followed by Dr. Alver Fisher             --d/c Naprosyn prn --was causing GI distress. Continue Voltaren gel prn.   10. ABLA: Discussed that hemoglobin improved on repeat  -3/3 HGB stable 8.7  11. Dyspnea on exertion: CXR ordered and discussed that there is no acute cardiopulmonary process. Incentive spirometer ordered, flutter valve ordered  12. Decreased range of motion of right knee: discussed that ortho does not recommend CPM  13. S/p right reverse shoulder arthroplasty: continue unrestricted range of motion  14. Constipation: discussed that her husband will bring inn prunes which are effective for  her, last BM 3/1, d/c bisacodyl  -3/3 LBM today  LOS: 6 days A FACE TO FACE EVALUATION WAS PERFORMED  Fanny Dance 07/02/2023, 2:25 PM

## 2023-07-02 NOTE — Progress Notes (Signed)
 Occupational Therapy Session Note  Patient Details  Name: Bailey Petersen MRN: [id number] Date of Birth: 14-Dec-1940  Today's Date: 07/02/2023 OT Individual Time: 1005-1100 & 1505-1530 OT Individual Time Calculation (min): 55 min & 25 min   Short Term Goals: Week 1:  OT Short Term Goal 1 (Week 1): STG = LTG due to ELOS  Skilled Therapeutic Interventions/Progress Updates:  Session 1 Skilled OT intervention completed with focus on DC planning, DME education, shower transfers, AE education. Pt received seated in w/c, agreeable to session. 3/10 pain reported in R hip; applied voltaren at start of session, ice applied at end of session; pre-medicated. OT additionally offered rest breaks, repositioning throughout for pain reduction.  Pt declined self-care needs, reporting she showered with NT this AM. Transported dependently in w/c > ADL apartment. Pt reports bathroom set up as walk in shower with STE, curtain and built in bench. Demo provided on back stepping with RW into shower. Pt required close supervision for sit > stand and ambulatory transfer with RW to shower frame, then CGA to back step with mod cues for safety/sequencing as well as to exit shower back to w/c. Pt plans to have hired caregiver for showers and feels this transfer was appropriate with help, however did discuss/demo alternative of using TTB for increasing safety with transferring in/out of shower if alone as pt was able to complete with supervision only for in/out of shower. Handout issued to pt for later purchase if desired. Advised use of hand held shower head for ease of bathing. Handout given for suction cup shower head holder to reduce sit > stands without AD in shower.  Demo provided on use of reacher and sock aid for LB dressing especially with RLE due to difficulty lifting and pain levels. Pt was able to return demo donning of sock and threading of theraband to simulate pant leg with supervision/cues for technique only.  Handout issued for both.  Back in room pt remained seated in w/c, with chair alarm on/activated, and with all needs in reach at end of session.  Session 2 Skilled OT intervention completed with focus on RUE AAROM. Pt received seated in w/c, agreeable to session. No pain reported.  Pt requested to work on RUE. Transported dependently in w/c <> gym. Seated at table top, pt completed the following activities to promote functional use of BUE needed for independence with BADLs:  Table slides on LUE/BUE (x20) -shoulder flexion -horizontal abduction  AAROM (with 2 lb dowel) (x20) -BUE shoulder flexion -BUE protraction -RUE shoulder external rotation (staying within 30 deg)  Back in room, pt remained seated in w/c, with chair alarm on/activated, and with all needs in reach at end of session.   Therapy Documentation Precautions:  Precautions Precautions: Fall Precaution/Restrictions Comments: recent R TSA on 05/10/23 (stay within 30 degrees external rotation and limit lifting to 5 lbs) Restrictions Weight Bearing Restrictions Per Provider Order: Yes RLE Weight Bearing Per Provider Order: Weight bearing as tolerated    Therapy/Group: Individual Therapy  Melvyn Novas, MS, OTR/L  07/02/2023, 3:35 PM

## 2023-07-02 NOTE — Progress Notes (Signed)
 Patient ID: Bailey Petersen, female   DOB: Mar 10, 1941, 83 y.o.   MRN: [id number]  Met with pt to discuss OP and she has gone to [facility] located close to home. Have reached out to them and made a referral for OPPT. Also have ordered a wheelchair pt will get rolling walker on own since not covered.

## 2023-07-03 DIAGNOSIS — S7291XS Unspecified fracture of right femur, sequela: Secondary | ICD-10-CM | POA: Diagnosis not present

## 2023-07-03 MED ORDER — VITAMIN D 25 MCG (1000 UNIT) PO TABS
3000.0000 [IU] | ORAL_TABLET | Freq: Every day | ORAL | Status: DC
  Administered 2023-07-04: 3000 [IU] via ORAL
  Filled 2023-07-03: qty 3

## 2023-07-03 NOTE — Progress Notes (Signed)
 Physical Therapy Discharge Summary  Patient Details  Name: Bailey Petersen MRN: [id number] Date of Birth: 1941/03/18  Date of Discharge from PT service:July 03, 2023  Today's Date: 07/03/2023 PT Individual Time: 9528-4132 PT Individual Time Calculation (min): 70 min   Patient has met 9 of 10 long term goals due to improved activity tolerance, improved balance, improved postural control, increased strength, increased range of motion, decreased pain, ability to compensate for deficits, and improved coordination. Patient to discharge at a wheelchair level Modified Independent. [name] care partner is independent to provide the necessary physical assistance at discharge.  Reasons goals not met: Pt did not meet step navigation goal of supervision as pt currently requires CGA for safety due to pain.   Recommendation:  Patient will benefit from ongoing skilled PT services in outpatient setting to continue to advance safe functional mobility, address ongoing impairments in transfers, global strengthening and endurance, ROM, dynamic standing balance/coordination, gait training, and to minimize fall risk.  Equipment: 16x16 manual WC - pt purchasing RW independently   Reasons for discharge: treatment goals met and discharge from hospital  Patient/family agrees with progress made and goals achieved: Yes  Today's Interventions: Received pt sitting in WC, pt agreeable to PT treatment, and reported pain in R hip/knee - RN notified. Session with emphasis on discharge planning, functional mobility/transfers, generalized strengthening and endurance, dynamic standing balance/coordination, gait training, curb navigation, and simulated car transfers.   Went through sensation, MMT, and pain interference questionnaire in preparation for discharge. Pt's new WC delivered - therapist assisted with set up. Pt performed all transfers with RW and mod I throughout session. Pt ambulated 165ft with RW and mod I to ortho  gym and performed ambulatory simulated car transfer with RW and supervision, then ambulated 62ft on uneven surfaces (ramp) with RW and supervision. RN arrived to adminster pain medication, then pt performed WC mobility 124ft using BUE and supervision to main therapy gym. Pt educated on donning/doffing WC legrests (able to perform with supervision and intermittent cues).   Pt then navigated 1 6in curb with RW and CGA; able to recall cues for "up with the good, down with the bad" technique. Pt then performed seated BUE/BLE strengthening on Nustep at workload 4 for 10 minutes for a total of 434 steps with emphasis on cardiovascular endurance and R hip ROM. Pt able to stand and pick up object from floor using reacher and mod I and ambulated ~45ft with RW and mod I and practiced picking up squigz from floor using reacher and mod I. Pt then performed seated R hip flexion 3x10 and seated R knee extension 3x10 with emphasis on strength/ROM. Returned to room and concluded session with pt sitting in [facility] with all needs within reach. Provided pt with ice packs and pt made mod I in room - safety plan updated.   PT Discharge Precautions/Restrictions Precautions Precautions: Fall Precaution/Restrictions Comments: recent R TSA on 05/10/23 (stay within 30 degrees external rotation and limit lifting to 5 lbs) Restrictions Weight Bearing Restrictions Per Provider Order: No RLE Weight Bearing Per Provider Order: Weight bearing as tolerated Pain Interference Pain Interference Pain Effect on Sleep: 1. Rarely or not at all Pain Interference with Therapy Activities: 1. Rarely or not at all Pain Interference with Day-to-Day Activities: 1. Rarely or not at all Cognition Overall Cognitive Status: Within Functional Limits for tasks assessed Arousal/Alertness: Awake/alert Orientation Level: Oriented X4 Memory: Appears intact Awareness: Appears intact Problem Solving: Appears intact Safety/Judgment: Appears  intact Sensation Sensation Light  Touch: Appears Intact Hot/Cold: Not tested Proprioception: Appears Intact Stereognosis: Not tested Coordination Gross Motor Movements are Fluid and Coordinated: Yes Fine Motor Movements are Fluid and Coordinated: Yes Finger Nose Finger Test: WFL bilaterally Heel Shin Test: not tested Motor  Motor Motor: Within Functional Limits  Mobility Bed Mobility Bed Mobility: Rolling Right;Rolling Left;Sit to Supine;Supine to Sit Rolling Right: Independent with assistive device Rolling Left: Independent with assistive device Supine to Sit: Independent with assistive device Sit to Supine: Independent with assistive device Transfers Transfers: Sit to Stand;Stand to Sit;Stand Pivot Transfers Sit to Stand: Independent with assistive device Stand to Sit: Independent with assistive device Stand Pivot Transfers: Independent with assistive device Transfer (Assistive device): Rolling walker Locomotion  Gait Ambulation: Yes Gait Assistance: Independent with assistive device Gait Distance (Feet): 161 Feet Assistive device: Rolling walker Gait Gait: Yes Gait Pattern: Impaired Gait Pattern: Step-to pattern;Decreased step length - right;Decreased step length - left;Decreased stance time - right;Decreased stride length;Decreased weight shift to right;Poor foot clearance - left;Poor foot clearance - right;Narrow base of support;Antalgic;Step-through pattern Gait velocity: Decreased Stairs / Additional Locomotion Stairs: Yes Stairs Assistance: Contact Guard/Touching assist Stair Management Technique: With walker Number of Stairs: 1 Height of Stairs: 6 Ramp: Supervision/Verbal cueing (RW) Curb: Contact Guard/Touching assist (RW) Pick up small object from the floor assist level: Independent with assistive device Pick up small object from the floor assistive device: RW and Chief Operating Officer Mobility: Yes Wheelchair Assistance: Independent with  Scientist, research (life sciences): Both upper extremities Wheelchair Parts Management: Supervision/cueing Distance: >115ft  Trunk/Postural Assessment  Cervical Assessment Cervical Assessment: Within Functional Limits Thoracic Assessment Thoracic Assessment: Exceptions to [facility] (mild kyphosis) Lumbar Assessment Lumbar Assessment: Exceptions to [facility] (flexible posterior pelvic tilt) Postural Control Postural Control: Within Functional Limits  Balance Balance Balance Assessed: Yes Static Sitting Balance Static Sitting - Balance Support: Feet supported;No upper extremity supported Static Sitting - Level of Assistance: 7: Independent Dynamic Sitting Balance Dynamic Sitting - Balance Support: Feet supported;No upper extremity supported Dynamic Sitting - Level of Assistance: 6: Modified independent (Device/Increase time) Static Standing Balance Static Standing - Balance Support: Bilateral upper extremity supported;During functional activity (RW) Static Standing - Level of Assistance: 6: Modified independent (Device/Increase time) Dynamic Standing Balance Dynamic Standing - Balance Support: Bilateral upper extremity supported;During functional activity (RW) Dynamic Standing - Level of Assistance: 6: Modified independent (Device/Increase time) Dynamic Standing - Comments: with transfers and gait Extremity Assessment  RLE Assessment RLE Assessment: Exceptions to [facility] General Strength Comments: tested sitting in WC RLE Strength Right Hip Flexion: 3-/5 Right Hip ABduction: 3+/5 Right Hip ADduction: 3+/5 Right Knee Flexion: 3+/5 Right Knee Extension: 3-/5 Right Ankle Dorsiflexion: 4/5 Right Ankle Plantar Flexion: 4/5 LLE Assessment LLE Assessment: Exceptions to [facility] General Strength Comments: tested sitting in WC LLE Strength Left Hip Flexion: 4/5 Left Hip ABduction: 4/5 Left Hip ADduction: 4/5 Left Knee Flexion: 4-/5 Left Knee Extension: 4/5 Left Ankle Dorsiflexion: 4+/5 Left  Ankle Plantar Flexion: 4+/5   Marlana Salvage Zaunegger Blima Rich PT, DPT 07/03/2023, 6:55 AM

## 2023-07-03 NOTE — Progress Notes (Signed)
 Occupational Therapy Session Note  Patient Details  Name: Bailey Petersen MRN: [id number] Date of Birth: 1941/04/05  Today's Date: 07/03/2023 OT Individual Time: 0865-7846 & 1405-1500 OT Individual Time Calculation (min): 55 min & 55 min   Short Term Goals: Week 1:  OT Short Term Goal 1 (Week 1): STG = LTG due to ELOS  Skilled Therapeutic Interventions/Progress Updates:  Session 1 Skilled OT intervention completed with focus on ADL retraining, functional endurance, and mobility within a shower context. Pt received seated in w/c, agreeable to session. 4/10 pain reported in R hip with mobility; pre-medicated. OT offered rest breaks, repositioning and moist heat via shower for pain relief. Pt self applied voltaren to knee and OT applied topical ice at conclusion of session to R hip and knee.  Pt completed all sit > stands and ambulatory transfers with RW while using RW. Pt only needed supervision for entering/exiting shower with cues needed for safety.  Pt already with Tegaderm dressings in place over R hip surgical incisions therefore did not cover. Pt was able to bathe all parts with mod I, at the sit > stand level while using grab bar for balance. Able to donn shirt/deo with mod I. Threaded LB clothing with mod I using adaptive reacher technique, and no assist to stand with RW for over hips. Ambulated > w/c. Donned socks/shoes with mod I using sock aid and + time.  Pt ambulated 100 + 20 ft using RW with mod I. 1 seated rest break needed with w/c brought for fatigue. Discussed edema management strategies for RLE including ambulation, elevation, ice and TEDs; issued pt thigh high TEDs. Pt remained seated in w/c, with chair alarm on/activated, and with all needs in reach at end of session.  Session 2 Skilled OT intervention completed with focus on RLE/RUE ROM, cardiovascular endurance and functional transfers. Pt received seated in recliner, agreeable to session. R knee pain reported with  mobility; pre-medicated. OT offered rest breaks, repositioning throughout for pain reduction.  Pt completed all sit > stands and ambulatory transfers with RW with mod I. Ambulated > w/c, then transported dependently in w/c > gym. Pt requested to use nustep for RLE stretching. Transferred to nustep then, pt completed the following intervals on nustep to promote R knee extension and global/cardiovascular endurance needed for independence with BADLs and functional mobility: -5 mins, level 2 -5 mins, level 4 -5 mins, level 5  Seated in w/c pt completed the following activities to promote functional use of BUE needed for independence with BADLs: Table slides on LUE/BUE (x15) -shoulder flexion AAROM (with PVC pipe) -BUE shoulder flexion Theraband (With yellow theraband) 10 reps on RUE Horizontal abduction Self-anchored shoulder flexion each arm (AROM only 2/2 lack of full ROM) Self-anchored bicep flexion each arm  Back in room, pt remained seated in w/c, with chair alarm on/activated, and with all needs in reach at end of session.   Therapy Documentation Precautions:  Precautions Precautions: Fall Precaution/Restrictions Comments: recent R TSA on 05/10/23 (stay within 30 degrees external rotation and limit lifting to 5 lbs) Restrictions Weight Bearing Restrictions Per Provider Order: No RLE Weight Bearing Per Provider Order: Weight bearing as tolerated    Therapy/Group: Individual Therapy  Melvyn Novas, MS, OTR/L  07/03/2023, 3:03 PM

## 2023-07-03 NOTE — Plan of Care (Signed)
  Problem: RH Tub/Shower Transfers Goal: LTG Patient will perform tub/shower transfers w/assist (OT) Description: LTG: Patient will perform tub/shower transfers with assist, with/without cues using equipment (OT) Outcome: Not Met (add Reason) Flowsheets (Taken 07/03/2023 1547) LTG: Pt will perform tub/shower stall transfers with assistance level of: (not met due to supervision needed for safety) --   Problem: RH Balance Goal: LTG Patient will maintain dynamic standing with ADLs (OT) Description: LTG:  Patient will maintain dynamic standing balance with assist during activities of daily living (OT)  Outcome: Completed/Met   Problem: Sit to Stand Goal: LTG:  Patient will perform sit to stand in prep for activites of daily living with assistance level (OT) Description: LTG:  Patient will perform sit to stand in prep for activites of daily living with assistance level (OT) Outcome: Completed/Met   Problem: RH Bathing Goal: LTG Patient will bathe all body parts with assist levels (OT) Description: LTG: Patient will bathe all body parts with assist levels (OT) Outcome: Completed/Met   Problem: RH Dressing Goal: LTG Patient will perform lower body dressing w/assist (OT) Description: LTG: Patient will perform lower body dressing with assist, with/without cues in positioning using equipment (OT) Outcome: Completed/Met   Problem: RH Toileting Goal: LTG Patient will perform toileting task (3/3 steps) with assistance level (OT) Description: LTG: Patient will perform toileting task (3/3 steps) with assistance level (OT)  Outcome: Completed/Met   Problem: RH Simple Meal Prep Goal: LTG Patient will perform simple meal prep w/assist (OT) Description: LTG: Patient will perform simple meal prep with assistance, with/without cues (OT). Outcome: Completed/Met   Problem: RH Toilet Transfers Goal: LTG Patient will perform toilet transfers w/assist (OT) Description: LTG: Patient will perform toilet  transfers with assist, with/without cues using equipment (OT) Outcome: Completed/Met

## 2023-07-03 NOTE — Progress Notes (Signed)
 Inpatient Rehabilitation Care Coordinator Discharge Note   Patient Details  Name: Bailey Petersen MRN: [id number] Date of Birth: [birth year]   Discharge location: HOME WITH HUSBAND WHO IS ABLE TO PROVIDE ASSIST  Length of Stay: 9 DAYS  Discharge activity level: MOD/I LEVEL  Home/community participation: ACTIVE  Patient response YT:KZSWFU Literacy - How often do you need to have someone help you when you read instructions, pamphlets, or other written material from your doctor or pharmacy?: Never  Patient response XN:ATFTDD Isolation - How often do you feel lonely or isolated from those around you?: Never  Services provided included: MD, RD, PT, OT, RN, CM, Pharmacy, SW  Financial Services:  Financial Services Utilized: Medicare    Choices offered to/list presented to: PT  Follow-up services arranged:  Outpatient, DME, Patient/Family has no preference for HH/DME agencies    Outpatient Servicies: [facility] WILL CALL TO SET UP FOLLOW UP APPOINTMENTS DME : [facility]  WHEELCHAIR  WILL GET ROLLING WALKER ON OWN SINCE NOT COVERED    Patient response to transportation need: Is the patient able to respond to transportation needs?: Yes In the past 12 months, has lack of transportation kept you from medical appointments or from getting medications?: No In the past 12 months, has lack of transportation kept you from meetings, work, or from getting things needed for daily living?: No   Patient/Family verbalized understanding of follow-up arrangements:  Yes  Individual responsible for coordination of the follow-up plan: SELF AND WAYNE-HUSBAND [phone]  Confirmed correct DME delivered: Lucy Chris 07/03/2023    Comments (or additional information):PT DID WELL AND IS READY FOR DISCHARGE HOME.   Summary of Stay    Date/Time Discharge Planning CSW  06/27/23 [phone] Home with husband who can provide assist if needed. Was already going to OP for her shoulder and will  continue after DC. Await therapy evaluations RGD       Meia Emley, Lemar Livings

## 2023-07-03 NOTE — Discharge Summary (Signed)
 Physician Discharge Summary  Patient ID: Bailey Petersen MRN: [id number] DOB/AGE: [birth year] 83 y.o.  Admit date: 06/26/2023 Discharge date: 07/04/2023  Discharge Diagnoses:  Principal Problem:   Closed fracture of right femur, unspecified fracture morphology, sequela Active Problems:   S/P reverse total shoulder arthroplasty, right   Rheumatoid arthritis (HCC)   Acute blood loss anemia   Post-operative pain   Discharged Condition: stable  Significant Diagnostic Studies: DG Chest 2 View Result Date: 06/27/2023 CLINICAL DATA:  Shortness of breath EXAM: CHEST - 2 VIEW COMPARISON:  Chest x-ray 06/25/2022.  CT of the chest 06/25/2022. FINDINGS: The heart size and mediastinal contours are within normal limits. Faint nodular density seen in the right upper lobe measuring 5 mm. The lungs are otherwise clear. There is no pleural effusion or pneumothorax. The cardiomediastinal silhouette is within normal limits. Right shoulder arthroplasty is present. There is a proximal left humeral fixation plate and screws. IMPRESSION: 1. No active cardiopulmonary disease. 2. Faint nodular density in the right upper lobe measuring 5 mm. This can be followed up with chest CT as clinically warranted. Electronically Signed   By: Darliss Cheney M.D.   On: 06/27/2023 19:36     Labs:  Basic Metabolic Panel:    Latest Ref Rng & Units 07/02/2023    6:14 AM 06/28/2023    6:11 AM 06/27/2023    5:32 AM  BMP  Glucose 70 - 99 mg/dL 98  99  87   BUN 8 - 23 mg/dL 12  12  13    Creatinine 0.44 - 1.00 mg/dL 2.13  0.86  5.78   Sodium 135 - 145 mmol/L 138  142  141   Potassium 3.5 - 5.1 mmol/L 4.3  4.2  3.9   Chloride 98 - 111 mmol/L 104  107  106   CO2 22 - 32 mmol/L 23  26  25    Calcium 8.9 - 10.3 mg/dL 8.7  8.9  8.5      CBC:    Latest Ref Rng & Units 07/02/2023    6:14 AM 06/28/2023    6:11 AM 06/27/2023    5:32 AM  CBC  WBC 4.0 - 10.5 K/uL 6.0  5.2  5.5   Hemoglobin 12.0 - 15.0 g/dL 8.7  8.7  7.8   Hematocrit 36.0  - 46.0 % 29.0  28.8  25.2   Platelets 150 - 400 K/uL 357  242  198      CBG: No results for input(s): "GLUCAP" in the last 168 hours.  Brief HPI:   Bailey Petersen is a 83 y.o. female with history of RA, IBS, right total shoulder hip replacement 05/2023 who was admitted on 06/23/2022 falling off a stool and sustaining a displaced right peritrochanteric femur fracture.  She was evaluated by Dr. Steward Drone and underwent IM nailing of right femur.  Postop WBAT and on Lovenox for DVT prophylaxis.  She was noted to have ABLA as well as limitations due to weakness and right knee pain.  PT/OT was working with patient who is requiring min assist overall.  She was independent prior to admission and CIR recommended due to functional decline.   Hospital Course: Bailey Petersen was admitted to rehab 06/26/2023 for inpatient therapies to consist of PT and OT at least three hours five days a week. Past admission physiatrist, therapy team and rehab RN have worked together to provide customized collaborative inpatient rehab. Her blood pressures were monitored on TID basis and has been stable. Follow up  CBC showed H/H is improving. Check of BMET showed lytes and renal status to be WNL. Her right hip incisions are C/D/I with staples in place. Pain has been controlled with use of tylenol QID and prn tramadol. CXR ordered for follow up on DOE and was negative for acute changes. Constipation has resolved and she is continent of B/B. She has made good gains and is modified independent at discharge. She will continue to receive follow up outpatient PT at [facility]hab after discharge.     Rehab course: During patient's stay in rehab weekly team conferences were held to monitor patient's progress, set goals and discuss barriers to discharge. At admission, patient required min assist with ADL tasks and with mobility. She  has had improvement in activity tolerance, balance, postural control as well as ability to  compensate for deficits.  She is able to complete ADL tasks at modified independent level. She is independent for tranfers and to ambulate 57' with use of RW.    Discharge disposition: 01-Home or Self Care  Diet: Regular  Special Instructions: Continue aspirin 325 mg daily for a month.  2.  Use support stocking and elevation to help manage edema RLE.   Allergies as of 07/04/2023       Reactions   Hydrocodone Shortness Of Breath   Throat Swelling/ diffuse rash   Oxycodone Nausea Only   Tolerates morphine   Penicillins Swelling   reddness   Celebrex [celecoxib]    nausea   Sulfa Antibiotics         Medication List     STOP taking these medications    HYDROcodone-acetaminophen 5-325 MG tablet Commonly known as: NORCO/VICODIN   methocarbamol 500 MG tablet Commonly known as: ROBAXIN   naproxen 500 MG tablet Commonly known as: NAPROSYN       TAKE these medications    acetaminophen 500 MG tablet Commonly known as: TYLENOL Take 1 tablet (500 mg total) by mouth 4 (four) times daily -  with meals and at bedtime. What changed: when to take this   aspirin 325 MG tablet Take 1 tablet (325 mg total) by mouth daily.   cetirizine 10 MG tablet Commonly known as: ZYRTEC Take 10 mg by mouth daily as needed for allergies.   diclofenac Sodium 1 % Gel Commonly known as: VOLTAREN Apply 2 g topically 4 (four) times daily. To up to three affected joints   docusate sodium 100 MG capsule Commonly known as: COLACE Take 1 capsule (100 mg total) by mouth 2 (two) times daily.   folic acid 1 MG tablet Commonly known as: FOLVITE Take 1 mg by mouth daily.   glucosamine-chondroitin 500-400 MG tablet Take 1 tablet by mouth daily.   sulfaSALAzine 500 MG tablet Commonly known as: AZULFIDINE Take 1,500 mg by mouth daily.   traMADol 50 MG tablet--Rx# 28 pills.  Commonly known as: ULTRAM Take 1 tablet (50 mg total) by mouth 4 (four) times daily as needed for severe pain (pain  score 7-10).   Turmeric 500 MG Caps Take 500 mg by mouth daily.   VITAMIN C GUMMIES PO Take 2 each by mouth daily.   VITAMIN D3 PO Take 1 tablet by mouth daily.        Follow-up Information     Raulkar, Drema Pry, MD. Call.   Specialty: Physical Medicine and Rehabilitation Why: As needed Contact information: [address] [place] [address] [phone]         Leary Roca,  Mark, DO Follow up.   Specialty: Family Medicine Why: Call in 1-2 days for post hospital follow up Contact information: [address] [place] [address] [phone]                 Signed: Jacquelynn Cree 07/05/2023, 8:41 PM

## 2023-07-03 NOTE — Progress Notes (Signed)
 PROGRESS NOTE   Subjective/Complaints: No new complaints this morning Planning for d/c tomorrow, will reach out to ortho if they would like [place] to change dressing/order XRs  ROS: Denies CP,  N/V/D/C +right hip pain- stable, +shortness of breath- stable, decreased range of motion of right knee   Objective:   No results found.  Recent Labs    07/02/23 0614  WBC 6.0  HGB 8.7*  HCT 29.0*  PLT 357    Recent Labs    07/02/23 0614  NA 138  K 4.3  CL 104  CO2 23  GLUCOSE 98  BUN 12  CREATININE 0.63  CALCIUM 8.7*     Intake/Output Summary (Last 24 hours) at 07/03/2023 1029 Last data filed at 07/03/2023 0754 Gross per 24 hour  Intake 480 ml  Output --  Net 480 ml        Physical Exam: Vital Signs Blood pressure 115/68, pulse 85, temperature 98.3 F (36.8 C), temperature source Oral, resp. rate 18, height 5\' 3"  (1.6 m), weight 59 kg, SpO2 95%. Gen: no distress, normal appearing HEENT: oral mucosa pink and moist, NCAT Cardio: RRR Chest: CTAB, normal effort, normal rate of breathing Abd: soft, non-distended, +Bowel sounds Musculoskeletal:     Cervical back: Neck supple. No tenderness.     Comments: Edema right hip/right thigh. Multiple dressings right thigh to the knee.    UE strength 5/5 except Grip and FA 5-/5 B/L LLE 5/5 in HF, KE, KF, DF and PF RLE- HF and KE 2-/5 limited due to pain; distally 5/5  Skin:    General: Skin is warm and dry.     Comments: Dressings on R thigh laterally- moderate edema  Neurological:     Mental Status: She is alert and oriented to person, place, and time.     Comments: Intact to light touch in all 4 extremities, stable 3/4   Psychiatric:        Mood and Affect: Mood normal.        Behavior: Behavior normal.    Assessment/Plan: 1. Functional deficits which require 3+ hours per day of interdisciplinary therapy in a comprehensive inpatient rehab setting. Physiatrist is  providing close team supervision and 24 hour management of active medical problems listed below. Physiatrist and rehab team continue to assess barriers to discharge/monitor patient progress toward functional and medical goals  Care Tool:  Bathing    Body parts bathed by patient: Right arm, Left arm, Abdomen, Chest, Front perineal area, Buttocks, Right upper leg, Left upper leg, Left lower leg, Face, Right lower leg   Body parts bathed by helper: Right lower leg     Bathing assist Assist Level: Independent with assistive device     Upper Body Dressing/Undressing Upper body dressing   What is the patient wearing?: Pull over shirt    Upper body assist Assist Level: Independent with assistive device    Lower Body Dressing/Undressing Lower body dressing      What is the patient wearing?: Underwear/pull up, Pants     Lower body assist Assist for lower body dressing: Independent with assitive device Assistive Device Comment: Lobbyist    Toileting assist  Assist for toileting: Independent with assistive device     Transfers Chair/bed transfer  Transfers assist     Chair/bed transfer assist level: Supervision/Verbal cueing     Locomotion Ambulation   Ambulation assist      Assist level: Supervision/Verbal cueing Assistive device: Walker-rolling Max distance: 68ft   Walk 10 feet activity   Assist     Assist level: Supervision/Verbal cueing Assistive device: Walker-rolling   Walk 50 feet activity   Assist Walk 50 feet with 2 turns activity did not occur: Safety/medical concerns (pain, fatigue)  Assist level: Supervision/Verbal cueing Assistive device: Walker-rolling    Walk 150 feet activity   Assist Walk 150 feet activity did not occur: Safety/medical concerns (pain, fatigue)         Walk 10 feet on uneven surface  activity   Assist     Assist level: Minimal Assistance - Patient > 75% Assistive device:  Walker-rolling   Wheelchair     Assist Is the patient using a wheelchair?: Yes Type of Wheelchair: Manual    Wheelchair assist level: Independent Max wheelchair distance: 136ft    Wheelchair 50 feet with 2 turns activity    Assist        Assist Level: Independent   Wheelchair 150 feet activity     Assist      Assist Level: Independent   Blood pressure 115/68, pulse 85, temperature 98.3 F (36.8 C), temperature source Oral, resp. rate 18, height 5\' 3"  (1.6 m), weight 59 kg, SpO2 95%.   Medical Problem List and Plan: 1. Functional deficits secondary to right femoral fx s/p cephalomedullary nail- with WBAT             -patient may  shower- cover incision             -ELOS/Goals: 8 days mod I to supervision             Grounds pass ordered  D3 started  2. Impaired mobility, PT note reviewed and is ambulating 105 feet, continue lovenox             -antiplatelet therapy: ASA 81 mg prn mild for pain.  .  3. Pain Management:  MSIR prn for severe pain and tramadol for moderate pain prn. ain. Provided list of foods for pain  4. Mood/Behavior/Sleep: LCSW to follow for evaluation and support.              -antipsychotic agents: N/A 5. Neuropsych/cognition: This patient is capable of making decisions on her own behalf.  6. Skin/Wound Care: Routine pressure relief measures monitor incision for healing.   7. Fluids/Electrolytes/Nutrition: Monitor I/O. Check CMET in am  8. Right femur Fx s/p IM nail: WBAT. Increase D3 to 3,000U  9. RA: Managed with sulfasalazine with folic acid daily. Followed by Dr. Alver Fisher             --d/c Naprosyn prn --was causing GI distress. continue Voltaren gel prn.   10. ABLA: Discussed that hemoglobin improved on repeat  -3/3 HGB stable 8.7  11. Dyspnea on exertion: CXR ordered and discussed that there is no acute cardiopulmonary process. Incentive spirometer ordered, flutter valve ordered, continue  12. Decreased range of motion of  right knee: discussed that ortho does not recommend CPM  13. S/p right reverse shoulder arthroplasty: continue unrestricted range of motion with no more than 30 degrees external rotation  14. Constipation: discussed that her husband will bring inn prunes which are effective for her, last BM 3/1,  d/c bisacodyl  -3/3 LBM, d/c enema  LOS: 7 days A FACE TO FACE EVALUATION WAS PERFORMED  Clint Bolder P Quinlin Conant 07/03/2023, 10:29 AM

## 2023-07-03 NOTE — Progress Notes (Signed)
 Occupational Therapy Discharge Summary  Patient Details  Name: Bailey Petersen MRN: [id number] Date of Birth: 1941/02/16  Date of Discharge from OT service:July 03, 2023   Patient has met 7 of 8 long term goals due to improved activity tolerance, improved balance, ability to compensate for deficits, functional use of  RIGHT lower extremity, and improved coordination. Patient to discharge at overall Modified Independent level. [name] care partner is independent to provide the necessary physical assistance at discharge. Pt has arrangements for hired assist at DC as well.  Reasons goals not met: Shower transfer goal not met at mod I level due to safety cues needed requiring supervision  Recommendation:  No follow up needed  Equipment: No equipment provided  Reasons for discharge: treatment goals met  Patient/family agrees with progress made and goals achieved: Yes  OT Discharge Precautions/Restrictions  Precautions Precautions: Fall Precaution/Restrictions Comments: recent R TSA on 05/10/23 (stay within 30 degrees external rotation and limit lifting to 5 lbs) Restrictions Weight Bearing Restrictions Per Provider Order: No RLE Weight Bearing Per Provider Order: Weight bearing as tolerated ADL ADL Eating: Independent Where Assessed-Eating: Wheelchair Grooming: Independent Where Assessed-Grooming: Sitting at sink Upper Body Bathing: Independent Where Assessed-Upper Body Bathing: Shower Lower Body Bathing: Modified independent Where Assessed-Lower Body Bathing: Shower Upper Body Dressing: Modified independent (Device) Where Assessed-Upper Body Dressing: Sitting at sink Lower Body Dressing: Modified independent Where Assessed-Lower Body Dressing: Sitting at sink, Standing at sink Toileting: Modified independent Where Assessed-Toileting: Neurosurgeon Method: Proofreader: Raised toilet seat, Grab bars,  Other (comment) (RW) Tub/Shower Transfer: Not assessed Tub/Shower Transfer Method: Unable to assess Film/video editor: Close supervision Film/video editor Method: Designer, industrial/product: Sales promotion account executive Baseline Vision/History: 1 Wears glasses Patient Visual Report: No change from baseline Vision Assessment?: Wears glasses for reading Perception  Perception: Within Functional Limits Praxis Praxis: WFL Cognition Cognition Overall Cognitive Status: Within Functional Limits for tasks assessed Arousal/Alertness: Awake/alert Orientation Level: Person;Place;Situation Person: Oriented Place: Oriented Situation: Oriented Memory: Appears intact Awareness: Appears intact Problem Solving: Appears intact Safety/Judgment: Appears intact Brief Interview for Mental Status (BIMS) Repetition of Three Words (First Attempt): 3 Temporal Orientation: Year: Correct Temporal Orientation: Month: Accurate within 5 days Temporal Orientation: Day: Correct Recall: "Sock": Yes, no cue required Recall: "Blue": Yes, no cue required Recall: "Bed": Yes, no cue required BIMS Summary Score: 15 Sensation Sensation Light Touch: Appears Intact Hot/Cold: Not tested Proprioception: Appears Intact Stereognosis: Not tested Coordination Gross Motor Movements are Fluid and Coordinated: Yes Fine Motor Movements are Fluid and Coordinated: Yes Finger Nose Finger Test: WFL bilaterally Heel Shin Test: not tested Motor  Motor Motor: Within Functional Limits Mobility  Bed Mobility Bed Mobility: Rolling Right;Rolling Left;Sit to Supine;Supine to Sit Rolling Right: Independent with assistive device Rolling Left: Independent with assistive device Supine to Sit: Independent with assistive device Sit to Supine: Independent with assistive device Transfers Sit to Stand: Independent with assistive device Stand to Sit: Independent with assistive device  Trunk/Postural Assessment   Cervical Assessment Cervical Assessment: Within Functional Limits Thoracic Assessment Thoracic Assessment: Exceptions to [facility] (mild kyphosis) Lumbar Assessment Lumbar Assessment: Exceptions to [facility] (flexible posterior pelvic tilt) Postural Control Postural Control: Within Functional Limits  Balance Balance Balance Assessed: Yes Static Sitting Balance Static Sitting - Balance Support: Feet supported;No upper extremity supported Static Sitting - Level of Assistance: 7: Independent Dynamic Sitting Balance Dynamic Sitting - Balance Support: Feet supported;No upper extremity supported Dynamic Sitting - Level of  Assistance: 6: Modified independent (Device/Increase time) Static Standing Balance Static Standing - Balance Support: Bilateral upper extremity supported;During functional activity (RW) Static Standing - Level of Assistance: 6: Modified independent (Device/Increase time) Dynamic Standing Balance Dynamic Standing - Balance Support: Bilateral upper extremity supported;During functional activity (RW) Dynamic Standing - Level of Assistance: 6: Modified independent (Device/Increase time) Dynamic Standing - Comments: with transfers and gait Extremity/Trunk Assessment RUE Assessment RUE Assessment: Exceptions to [facility] Active Range of Motion (AROM) Comments: Shoulder ER limited to 30 degrees due to precautions however WFL everywhere else General Strength Comments: 5 lb weight restriction, did not formally test but [facility] LUE Assessment LUE Assessment: Within Functional Limits   Milfred Krammes E Keiva Dina, MS, OTR/L  07/03/2023, 3:47 PM

## 2023-07-04 DIAGNOSIS — D62 Acute posthemorrhagic anemia: Secondary | ICD-10-CM | POA: Insufficient documentation

## 2023-07-04 DIAGNOSIS — G8918 Other acute postprocedural pain: Secondary | ICD-10-CM | POA: Insufficient documentation

## 2023-07-04 MED ORDER — DOCUSATE SODIUM 100 MG PO CAPS: 100.0000 mg | ORAL_CAPSULE | Freq: Two times a day (BID) | ORAL | 0 refills | Status: AC

## 2023-07-04 MED ORDER — DICLOFENAC SODIUM 1 % EX GEL: 2.0000 g | Freq: Four times a day (QID) | CUTANEOUS | 0 refills | Status: AC

## 2023-07-04 MED ORDER — ACETAMINOPHEN 500 MG PO TABS: 500.0000 mg | ORAL_TABLET | Freq: Three times a day (TID) | ORAL | Status: AC

## 2023-07-04 MED ORDER — TRAMADOL HCL 50 MG PO TABS: 50.0000 mg | ORAL_TABLET | Freq: Four times a day (QID) | ORAL | 0 refills | Status: DC | PRN

## 2023-07-04 NOTE — Progress Notes (Signed)
 Inpatient Rehabilitation Discharge Medication Review by a Pharmacist  A complete drug regimen review was completed for this patient to identify any potential clinically significant medication issues.  High Risk Drug Classes Is patient taking? Indication by Medication  Antipsychotic No   Anticoagulant No   Antibiotic No   Opioid Yes Tramadol-severe pain  Antiplatelet Yes ASA-VTE px  Hypoglycemics/insulin No   Vasoactive Medication No   Chemotherapy No   Other Yes Cetririzine-allergies Folic acid, sulfasalazine-RA Glucosamine/chondroitin-joint health Tumeric, Vit C. Vit D-supplementation Docusate-constipation Diclofenac gel-pain     Type of Medication Issue Identified Description of Issue Recommendation(s)  Drug Interaction(s) (clinically significant)     Duplicate Therapy     Allergy     No Medication Administration End Date     Incorrect Dose     Additional Drug Therapy Needed     Significant med changes from prior encounter (inform family/care partners about these prior to discharge).    Other       Clinically significant medication issues were identified that warrant physician communication and completion of prescribed/recommended actions by midnight of the next day:  No  Name of provider notified for urgent issues identified:   Provider Method of Notification:     Pharmacist comments:   Time spent performing this drug regimen review (minutes):  20   Netanel Yannuzzi A. Jeanella Craze, PharmD, BCPS, FNKF Clinical Pharmacist [place] Please utilize Amion for appropriate phone number to reach the unit pharmacist [facility])  07/04/2023 10:05 AM

## 2023-07-04 NOTE — Plan of Care (Signed)
  Problem: Consults Goal: RH GENERAL PATIENT EDUCATION Description: See Patient Education module for education specifics. Outcome: Progressing   Problem: RH BOWEL ELIMINATION Goal: RH STG MANAGE BOWEL WITH ASSISTANCE Description: STG Manage Bowel with toileting Assistance. Outcome: Progressing   Problem: RH BOWEL ELIMINATION Goal: RH STG MANAGE BOWEL W/MEDICATION W/ASSISTANCE Description: STG Manage Bowel with Medication with mod I Assistance. Outcome: Progressing   Problem: RH SAFETY Goal: RH STG ADHERE TO SAFETY PRECAUTIONS W/ASSISTANCE/DEVICE Description: STG Adhere to Safety Precautions With cues Assistance/Device. Outcome: Progressing   Problem: RH PAIN MANAGEMENT Goal: RH STG PAIN MANAGED AT OR BELOW PT'S PAIN GOAL Description: Pain < 4 with prns Outcome: Progressing   Problem: RH KNOWLEDGE DEFICIT GENERAL Goal: RH STG INCREASE KNOWLEDGE OF SELF CARE AFTER HOSPITALIZATION Description: Patient and spouse will be able to manage care at discharge using educational resources independently Outcome: Progressing

## 2023-07-04 NOTE — Progress Notes (Signed)
 Patient ID: Bailey Petersen, female   DOB: 1940/11/17, 83 y.o.   MRN: [id number]  INPATIENT REHABILITATION DISCHARGE NOTE   Discharge instructions by: Marissa Nestle, PA-C  Verbalized understanding: yes  Skin care/Wound care healing? Staples to be removed in office at follow up.  Pain: 1/10  IV's: n/a  Tubes/Drains: n/a  O2: n/a  Safety instructions: provided  Patient belongings: returned  Discharged to: home    Discharged via: family

## 2023-07-09 ENCOUNTER — Ambulatory Visit (HOSPITAL_BASED_OUTPATIENT_CLINIC_OR_DEPARTMENT_OTHER): Admitting: Orthopaedic Surgery

## 2023-07-09 ENCOUNTER — Ambulatory Visit (HOSPITAL_BASED_OUTPATIENT_CLINIC_OR_DEPARTMENT_OTHER)

## 2023-07-09 DIAGNOSIS — S72141A Displaced intertrochanteric fracture of right femur, initial encounter for closed fracture: Secondary | ICD-10-CM

## 2023-07-09 NOTE — Progress Notes (Signed)
 Post Operative Evaluation    Procedure/Date of Surgery: Right hip cephalomedullary nailing  Interval History:    Presents today 2 weeks status post right hip cephalomedullary nailing.  Overall she is progressing nicely.  She is now walking independently with a walker.  Denies any significant hip pain.  There is some residual weakness   PMH/PSH/Family History/Social History/Meds/Allergies:    Past Medical History:  Diagnosis Date   Arthritis    RA   Complication of anesthesia    GERD (gastroesophageal reflux disease)    History of hiatal hernia    IBS (irritable bowel syndrome)    PONV (postoperative nausea and vomiting)    Rheumatoid arthritis (HCC)    Past Surgical History:  Procedure Laterality Date   ABDOMINAL HYSTERECTOMY     BICEPT TENODESIS Right 05/10/2023   Procedure: BICEPS TENODESIS;  Surgeon: Cammy Copa, MD;  Location: [facility] OR;  Service: Orthopedics;  Laterality: Right;   EYE SURGERY Bilateral    cataract   FINGER ARTHROPLASTY Right 03/23/2020   Procedure: Right index finger metacarpal phalangeal arthroplasty with repair as necessary and centralization of the extensor tendon and right middle finger proximal interphalangeal arthroplasty with repair as necessary;  Surgeon: Dominica Severin, MD;  Location: [facility];  Service: Orthopedics;  Laterality: Right;   FOOT SURGERY Left    HAND SURGERY Left    INTRAMEDULLARY (IM) NAIL INTERTROCHANTERIC Right 06/24/2023   Procedure: INTRAMEDULLARY (IM) NAIL INTERTROCHANTERIC;  Surgeon: Huel Cote, MD;  Location: [facility] OR;  Service: Orthopedics;  Laterality: Right;   ORIF SHOULDER FRACTURE Left    REVERSE SHOULDER ARTHROPLASTY Right 05/10/2023   Procedure: REVERSE SHOULDER ARTHROPLASTY;  Surgeon: Cammy Copa, MD;  Location: [facility] OR;  Service: Orthopedics;  Laterality: Right;   TONSILLECTOMY     Social History   Socioeconomic History   Marital status: Married     Spouse name: Not on file   Number of children: 1   Years of education: Not on file   Highest education level: Not on file  Occupational History   Not on file  Tobacco Use   Smoking status: Never   Smokeless tobacco: Never  Vaping Use   Vaping status: Never Used  Substance and Sexual Activity   Alcohol use: Yes    Alcohol/week: 5.0 standard drinks of alcohol    Types: 5 Glasses of wine per week   Drug use: Never   Sexual activity: Not on file  Other Topics Concern   Not on file  Social History Narrative   Not on file   Social Drivers of Health   Financial Resource Strain: Not on file  Food Insecurity: No Food Insecurity (06/26/2023)   Hunger Vital Sign    Worried About Running Out of Food in the Last Year: Never true    Ran Out of Food in the Last Year: Never true  Transportation Needs: No Transportation Needs (06/26/2023)   PRAPARE - Administrator, Civil Service (Medical): No    Lack of Transportation (Non-Medical): No  Physical Activity: Not on file  Stress: Not on file  Social Connections: Socially Integrated (06/24/2023)   Social Connection and Isolation Panel [NHANES]    Frequency of Communication with Friends and Family: Three times a week    Frequency of Social Gatherings with Friends and  Family: Three times a week    Attends Religious Services: More than 4 times per year    Active Member of Clubs or Organizations: Yes    Attends Engineer, structural: More than 4 times per year    Marital Status: Married   Family History  Problem Relation Age of Onset   Rheumatic fever Mother    Allergies  Allergen Reactions   Hydrocodone Shortness Of Breath    Throat Swelling/ diffuse rash   Oxycodone Nausea Only    Tolerates morphine   Penicillins Swelling    reddness   Celebrex [Celecoxib]     nausea   Sulfa Antibiotics    Current Outpatient Medications  Medication Sig Dispense Refill   acetaminophen (TYLENOL) 500 MG tablet Take 1 tablet  (500 mg total) by mouth 4 (four) times daily -  with meals and at bedtime.     Ascorbic Acid (VITAMIN C GUMMIES PO) Take 2 each by mouth daily.     aspirin 325 MG tablet Take 1 tablet (325 mg total) by mouth daily.     cetirizine (ZYRTEC) 10 MG tablet Take 10 mg by mouth daily as needed for allergies.     Cholecalciferol (VITAMIN D3 PO) Take 1 tablet by mouth daily.     diclofenac Sodium (VOLTAREN) 1 % GEL Apply 2 g topically 4 (four) times daily. To up to three affected joints 350 g 0   docusate sodium (COLACE) 100 MG capsule Take 1 capsule (100 mg total) by mouth 2 (two) times daily. 60 capsule 0   folic acid (FOLVITE) 1 MG tablet Take 1 mg by mouth daily.     glucosamine-chondroitin 500-400 MG tablet Take 1 tablet by mouth daily.     sulfaSALAzine (AZULFIDINE) 500 MG tablet Take 1,500 mg by mouth daily.     traMADol (ULTRAM) 50 MG tablet Take 1 tablet (50 mg total) by mouth 4 (four) times daily as needed for severe pain (pain score 7-10). 28 tablet 0   Turmeric 500 MG CAPS Take 500 mg by mouth daily.     No current facility-administered medications for this visit.   No results found.  Review of Systems:   A ROS was performed including pertinent positives and negatives as documented in the HPI.   Musculoskeletal Exam:    There were no vitals taken for this visit.  Right hip incision is well-appearing without erythema or drainage.  Walks with mildly antalgic gait.  Distal neurosensory exam is intact.  Painless range of motion about the knee  Imaging:    3 views right femur: Status post cephalomedullary nailing without evidence of complication  I personally reviewed and interpreted the radiographs.   Assessment:   2 weeks status post right hip cephalomedullary nailing without complication overall doing well.  At this time should continue to progress her weightbearing and strengthening.  I will plan to see her back in 4 weeks for reassessment  Plan :    -Clinic 4 weeks for  reassessment      I personally saw and evaluated the patient, and participated in the management and treatment plan.  Huel Cote, MD Attending Physician, Orthopedic Surgery  This document was dictated using Dragon voice recognition software. A reasonable attempt at proof reading has been made to minimize errors.

## 2023-07-25 ENCOUNTER — Encounter: Payer: Self-pay | Admitting: Orthopedic Surgery

## 2023-07-25 ENCOUNTER — Ambulatory Visit: Payer: Medicare Other | Admitting: Orthopedic Surgery

## 2023-07-25 DIAGNOSIS — Z96611 Presence of right artificial shoulder joint: Secondary | ICD-10-CM

## 2023-07-25 DIAGNOSIS — M19012 Primary osteoarthritis, left shoulder: Secondary | ICD-10-CM

## 2023-07-25 NOTE — Progress Notes (Signed)
 Post-Op Visit Note   Patient: Bailey Petersen           Date of Birth: 1940/11/18           MRN: [id number] Visit Date: 07/25/2023 PCP: Bailey Pont, DO   Assessment & Plan:  Chief Complaint:  Chief Complaint  Patient presents with   Right Shoulder - Pain    States right shoulder feeling better Having some discomfort when PT and using walker   Visit Diagnoses:  1. Arthritis of left shoulder region   2. Status post reverse arthroplasty of right shoulder     Plan: Bailey Petersen is a 83 year old patient underwent reverse shoulder replacement 05/10/2023.  She is doing very well from her shoulder.  In the interim she has had a hip fracture treated with IM nail.  She is managing with that new fracture.  On examination of the shoulder she has forward flexion and abduction both above 90 degrees with good rotator cuff strength to external rotation as well as subscap strength testing.  She also has about a 1 cm leg length discrepancy right shorter than left consistent with possible settling of that fracture evidenced by the distal interlocking screw cutting through the medial cortex.  From a shoulder perspective we will see her back in 3 months for final check.  Continue with strengthening and range of motion exercises on her own.  Follow-Up Instructions: No follow-ups on file.   Orders:  No orders of the defined types were placed in this encounter.  No orders of the defined types were placed in this encounter.   Imaging: No results found.  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   Past Medical History:  Diagnosis Date   Arthritis    RA   Complication of anesthesia    GERD (gastroesophageal reflux disease)    History of hiatal hernia    IBS (irritable bowel syndrome)    PONV (postoperative nausea and vomiting)    Rheumatoid arthritis (HCC)     Family History  Problem Relation Age of Onset   Rheumatic fever Mother     Past Surgical History:  Procedure Laterality Date   ABDOMINAL HYSTERECTOMY     BICEPT TENODESIS Right 05/10/2023   Procedure: BICEPS TENODESIS;  Surgeon: Cammy Copa, MD;  Location: [facility] OR;  Service: Orthopedics;  Laterality: Right;   EYE SURGERY Bilateral    cataract   FINGER ARTHROPLASTY Right 03/23/2020   Procedure: Right index finger metacarpal phalangeal arthroplasty with repair as necessary and centralization of the extensor tendon and right middle finger proximal interphalangeal arthroplasty with repair as necessary;  Surgeon: Dominica Severin, MD;  Location: [facility];  Service: Orthopedics;  Laterality: Right;   FOOT SURGERY Left    HAND SURGERY Left    INTRAMEDULLARY (IM) NAIL INTERTROCHANTERIC Right 06/24/2023   Procedure: INTRAMEDULLARY (IM) NAIL INTERTROCHANTERIC;  Surgeon: Huel Cote, MD;  Location: [facility] OR;  Service: Orthopedics;  Laterality: Right;   ORIF SHOULDER FRACTURE Left    REVERSE SHOULDER ARTHROPLASTY Right 05/10/2023   Procedure: REVERSE SHOULDER ARTHROPLASTY;  Surgeon: Cammy Copa,  MD;  Location: [facility] OR;  Service: Orthopedics;  Laterality: Right;   TONSILLECTOMY     Social History   Occupational History   Not on file  Tobacco Use   Smoking status: Never   Smokeless tobacco: Never  Vaping Use   Vaping status: Never Used  Substance and Sexual Activity   Alcohol use: Yes    Alcohol/week: 5.0 standard drinks of alcohol    Types: 5 Glasses of wine per week   Drug use: Never   Sexual activity: Not on file

## 2023-08-08 ENCOUNTER — Ambulatory Visit (HOSPITAL_BASED_OUTPATIENT_CLINIC_OR_DEPARTMENT_OTHER)

## 2023-08-08 ENCOUNTER — Ambulatory Visit (HOSPITAL_BASED_OUTPATIENT_CLINIC_OR_DEPARTMENT_OTHER): Admitting: Orthopaedic Surgery

## 2023-08-08 DIAGNOSIS — S72141A Displaced intertrochanteric fracture of right femur, initial encounter for closed fracture: Secondary | ICD-10-CM

## 2023-08-08 NOTE — Progress Notes (Signed)
 Post Operative Evaluation    Procedure/Date of Surgery: Right hip cephalomedullary nailing  Interval History:    Presents today 6 weeks status post right hip cephalomedullary nailing.  Overall she is continuing to improve.  Now just walking with assistance of a cane.  Her knee swelling is improved as well   PMH/PSH/Family History/Social History/Meds/Allergies:    Past Medical History:  Diagnosis Date   Arthritis    RA   Complication of anesthesia    GERD (gastroesophageal reflux disease)    History of hiatal hernia    IBS (irritable bowel syndrome)    PONV (postoperative nausea and vomiting)    Rheumatoid arthritis (HCC)    Past Surgical History:  Procedure Laterality Date   ABDOMINAL HYSTERECTOMY     BICEPT TENODESIS Right 05/10/2023   Procedure: BICEPS TENODESIS;  Surgeon: Cammy Copa, MD;  Location: [facility] OR;  Service: Orthopedics;  Laterality: Right;   EYE SURGERY Bilateral    cataract   FINGER ARTHROPLASTY Right 03/23/2020   Procedure: Right index finger metacarpal phalangeal arthroplasty with repair as necessary and centralization of the extensor tendon and right middle finger proximal interphalangeal arthroplasty with repair as necessary;  Surgeon: Dominica Severin, MD;  Location: [facility];  Service: Orthopedics;  Laterality: Right;   FOOT SURGERY Left    HAND SURGERY Left    INTRAMEDULLARY (IM) NAIL INTERTROCHANTERIC Right 06/24/2023   Procedure: INTRAMEDULLARY (IM) NAIL INTERTROCHANTERIC;  Surgeon: Huel Cote, MD;  Location: [facility] OR;  Service: Orthopedics;  Laterality: Right;   ORIF SHOULDER FRACTURE Left    REVERSE SHOULDER ARTHROPLASTY Right 05/10/2023   Procedure: REVERSE SHOULDER ARTHROPLASTY;  Surgeon: Cammy Copa, MD;  Location: [facility] OR;  Service: Orthopedics;  Laterality: Right;   TONSILLECTOMY     Social History   Socioeconomic History   Marital status: Married    Spouse name: Not on file    Number of children: 1   Years of education: Not on file   Highest education level: Not on file  Occupational History   Not on file  Tobacco Use   Smoking status: Never   Smokeless tobacco: Never  Vaping Use   Vaping status: Never Used  Substance and Sexual Activity   Alcohol use: Yes    Alcohol/week: 5.0 standard drinks of alcohol    Types: 5 Glasses of wine per week   Drug use: Never   Sexual activity: Not on file  Other Topics Concern   Not on file  Social History Narrative   Not on file   Social Drivers of Health   Financial Resource Strain: Not on file  Food Insecurity: No Food Insecurity (06/26/2023)   Hunger Vital Sign    Worried About Running Out of Food in the Last Year: Never true    Ran Out of Food in the Last Year: Never true  Transportation Needs: No Transportation Needs (06/26/2023)   PRAPARE - Administrator, Civil Service (Medical): No    Lack of Transportation (Non-Medical): No  Physical Activity: Not on file  Stress: Not on file  Social Connections: Socially Integrated (06/24/2023)   Social Connection and Isolation Panel [NHANES]    Frequency of Communication with Friends and Family: Three times a week    Frequency of Social Gatherings with Friends and Family: Three times  a week    Attends Religious Services: More than 4 times per year    Active Member of Clubs or Organizations: Yes    Attends Engineer, structural: More than 4 times per year    Marital Status: Married   Family History  Problem Relation Age of Onset   Rheumatic fever Mother    Allergies  Allergen Reactions   Hydrocodone Shortness Of Breath    Throat Swelling/ diffuse rash   Oxycodone Nausea Only    Tolerates morphine   Penicillins Swelling    reddness   Celebrex [Celecoxib]     nausea   Sulfa Antibiotics    Current Outpatient Medications  Medication Sig Dispense Refill   acetaminophen (TYLENOL) 500 MG tablet Take 1 tablet (500 mg total) by mouth 4 (four)  times daily -  with meals and at bedtime.     Ascorbic Acid (VITAMIN C GUMMIES PO) Take 2 each by mouth daily.     cetirizine (ZYRTEC) 10 MG tablet Take 10 mg by mouth daily as needed for allergies.     Cholecalciferol (VITAMIN D3 PO) Take 1 tablet by mouth daily.     diclofenac Sodium (VOLTAREN) 1 % GEL Apply 2 g topically 4 (four) times daily. To up to three affected joints 350 g 0   docusate sodium (COLACE) 100 MG capsule Take 1 capsule (100 mg total) by mouth 2 (two) times daily. 60 capsule 0   folic acid (FOLVITE) 1 MG tablet Take 1 mg by mouth daily.     glucosamine-chondroitin 500-400 MG tablet Take 1 tablet by mouth daily.     sulfaSALAzine (AZULFIDINE) 500 MG tablet Take 1,500 mg by mouth daily.     traMADol (ULTRAM) 50 MG tablet Take 1 tablet (50 mg total) by mouth 4 (four) times daily as needed for severe pain (pain score 7-10). 28 tablet 0   Turmeric 500 MG CAPS Take 500 mg by mouth daily.     No current facility-administered medications for this visit.   DG FEMUR, MIN 2 VIEWS RIGHT Result Date: 08/08/2023 CLINICAL DATA:  Postop. EXAM: RIGHT FEMUR 2 VIEWS COMPARISON:  Radiograph 07/09/2023 FINDINGS: Femoral intramedullary nail with trans trochanteric and distal locking screw fixation traversing proximal femur fracture. Unchanged fracture alignment. Some peripheral callus formation at the fracture site. 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 Improving subcutaneous edema. IMPRESSION: ORIF of proximal femur fracture in unchanged alignment. No postoperative complications. Electronically Signed   By: Narda Rutherford M.D.   On: 08/08/2023 12:15    Review of Systems:   A ROS was performed including pertinent positives and negatives as documented in the HPI.   Musculoskeletal Exam:    There were no vitals taken for this visit.  Right hip incision is well-appearing without erythema or drainage.  Walks with mildly antalgic gait.  Distal neurosensory exam is intact.  Painless  range of motion about the knee  Imaging:    3 views right femur: Status post cephalomedullary nailing without evidence of complication  I personally reviewed and interpreted the radiographs.   Assessment:   6 weeks status post right hip cephalomedullary nailing without complication overall doing well.  Will continue weightbearing as tolerated at this time.  I plan to see her back in 6 weeks for final check  Plan :    -Clinic 6 weeks for reassessment      I personally saw and evaluated the patient, and participated in the management and treatment plan.  Huel Cote, MD  Attending Physician, Orthopedic Surgery  This document was dictated using Dragon voice recognition software. A reasonable attempt at proof reading has been made to minimize errors.

## 2023-08-20 ENCOUNTER — Encounter: Admitting: Physician Assistant

## 2023-08-20 ENCOUNTER — Telehealth: Payer: Self-pay | Admitting: Radiology

## 2023-08-20 NOTE — Telephone Encounter (Signed)
 FYI  This is a [facility] patient that was on Gil's schedule for Wednesday. She's requesting another shoulder injection, looks like last injection was 06/13/23 with [facility].   LVM for patient to reschedule since its too early for another injection.

## 2023-08-20 NOTE — Telephone Encounter (Signed)
 Sounds like good plan.  I think the soonest we could repeat this injection would be like mid-May.  She is probably aggravating that shoulder more than she is used to using that walker recently with her hip fracture.

## 2023-08-21 ENCOUNTER — Encounter (HOSPITAL_BASED_OUTPATIENT_CLINIC_OR_DEPARTMENT_OTHER): Payer: Self-pay | Admitting: Orthopaedic Surgery

## 2023-08-22 ENCOUNTER — Ambulatory Visit: Admitting: Physician Assistant

## 2023-08-30 ENCOUNTER — Encounter (HOSPITAL_BASED_OUTPATIENT_CLINIC_OR_DEPARTMENT_OTHER): Admitting: Orthopaedic Surgery

## 2023-09-07 ENCOUNTER — Encounter (HOSPITAL_BASED_OUTPATIENT_CLINIC_OR_DEPARTMENT_OTHER): Admitting: Orthopaedic Surgery

## 2023-09-12 ENCOUNTER — Other Ambulatory Visit: Payer: Self-pay

## 2023-09-12 ENCOUNTER — Other Ambulatory Visit (INDEPENDENT_AMBULATORY_CARE_PROVIDER_SITE_OTHER): Payer: Self-pay

## 2023-09-12 ENCOUNTER — Encounter: Payer: Self-pay | Admitting: Orthopedic Surgery

## 2023-09-12 ENCOUNTER — Ambulatory Visit (INDEPENDENT_AMBULATORY_CARE_PROVIDER_SITE_OTHER): Admitting: Orthopedic Surgery

## 2023-09-12 DIAGNOSIS — M25511 Pain in right shoulder: Secondary | ICD-10-CM | POA: Diagnosis not present

## 2023-09-12 DIAGNOSIS — M19012 Primary osteoarthritis, left shoulder: Secondary | ICD-10-CM

## 2023-09-12 DIAGNOSIS — M5416 Radiculopathy, lumbar region: Secondary | ICD-10-CM

## 2023-09-12 DIAGNOSIS — M1711 Unilateral primary osteoarthritis, right knee: Secondary | ICD-10-CM | POA: Diagnosis not present

## 2023-09-12 NOTE — Progress Notes (Signed)
 Office Visit Note   Patient: Bailey Petersen           Date of Birth: Apr 23, 1941           MRN: [id number] Visit Date: 09/12/2023 Requested by: Dorrine Gaudy, DO [address] [place],  [place] [address] PCP: Dorrine Gaudy, DO  Subjective: Chief Complaint  Patient presents with   Left Shoulder - Pain, Follow-up    Planned [facility] injection today   Right Shoulder - Routine Post Op    05/10/23 right RSA    HPI: SONNI BARSE is a 83 y.o. female who presents to the office reporting for follow-up of right reverse shoulder replacement.  She is doing well with that but has occasional pain with range of motion.  She has been using assistive walking devices following hip fracture in February.  Does have rheumatoid arthritis and takes rheumatoid arthritis medication.  Having left shoulder pain which is similar to the right but not quite as severe.  She has spondylolisthesis at L5-S1 based on MRI scanning from 2022.  She is having some right sided low back pain.  She is using a heel lift on the right-hand side for leg length discrepancy of about 1-1/2 cm.  She is taking naproxen  and extra strength Tylenol .  She also reports some right knee pain which is primarily joint line pain and not necessarily consistent with referred pain from the hip..                ROS: All systems reviewed are negative as they relate to the chief complaint within the history of present illness.  Patient denies fevers or chills.  Assessment & Plan: Visit Diagnoses:  1. Arthritis of left shoulder region   2. Right shoulder pain, unspecified chronicity   3. Radiculopathy, lumbar region     Plan: Impression is right knee moderate arthritis with pain with ambulation.  Cortisone injection performed today.  Right shoulder looks like it is doing well in terms of range of motion and strength.  Has good subscap and external rotation strength on exam.  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  We will also have her see Dr. Daisey Dryer for consideration of lumbar spine ESI for right sided radiculopathy.  65-month return with decision for or against possible shoulder intervention on the left-hand side in the form of replacement versus repeat injection.  Follow-Up Instructions: No follow-ups on file.   Orders:  Orders Placed This Encounter  Procedures   US  Guided Needle Placement - No Linked Charges   XR Shoulder Right   XR Lumbar Spine 2-3 Views   No orders of the defined types were placed in this encounter.     Procedures: Large Joint Inj: R knee on 09/12/2023 12:03 PM Indications: diagnostic evaluation, joint swelling and pain Details: 18 G 1.5 in needle, superolateral approach  Arthrogram: No  Medications: 5 mL lidocaine  1 %; 4 mL bupivacaine  0.25 % Outcome: tolerated well, no immediate complications Procedure, treatment alternatives, risks and benefits explained, specific risks discussed. Consent was given by the patient. Immediately prior to procedure a time out was called to verify the correct patient, procedure, equipment, support staff and site/side marked as required. Patient was prepped and draped in the usual sterile fashion.    Large Joint Inj: L glenohumeral on 09/12/2023 12:03 PM Indications: diagnostic evaluation and pain Details: 22 G 3.5 in needle, ultrasound-guided posterior approach  Arthrogram: No  Medications: 9  mL bupivacaine  0.5 %; 5 mL lidocaine  1 % Outcome: tolerated well, no immediate complications Procedure, treatment alternatives, risks and benefits explained, specific risks discussed. Consent was given by the patient. Immediately prior to procedure a time out was called to verify the correct patient, procedure, equipment, support staff and site/side marked as required. Patient was prepped and draped in the usual sterile fashion.     Kenalog injected both joints  Clinical Data: No additional findings.  Objective: Vital  Signs: There were no vitals taken for this visit.  Physical Exam:  Constitutional: Patient appears well-developed HEENT:  Head: Normocephalic Eyes:EOM are normal Neck: Normal range of motion Cardiovascular: Normal rate Pulmonary/chest: Effort normal Neurologic: Patient is alert Skin: Skin is warm Psychiatric: Patient has normal mood and affect  Ortho Exam: Ortho exam demonstrates mild right leg pain with internal and external rotation of the leg.  She has no right knee effusion but some medial joint line tenderness.  Collateral cruciate ligaments are stable.  Pedal pulses palpable.  Equivocal nerve root tension signs on the right negative on the left.  Does localize some back pain and spasm around the SI joints on the right-hand side.  Mild pain with forward and lateral bending.  Left shoulder has some crepitus with range of motion which is painful.  Patient does have forward flexion abduction in both shoulders above 90 degrees.  There is sensory function to the hand intact.  Specialty Comments:  No specialty comments available.  Imaging: No results found.   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 OR;  Service: Orthopedics;  Laterality: Right;   EYE SURGERY Bilateral    cataract   FINGER ARTHROPLASTY Right 03/23/2020   Procedure: Right index finger metacarpal phalangeal arthroplasty with repair as necessary and centralization of the extensor tendon and right middle finger proximal interphalangeal arthroplasty with repair as necessary;  Surgeon: Ronn Cohn, MD;  Location: [facility];  Service: Orthopedics;  Laterality: Right;   FOOT SURGERY Left    HAND SURGERY Left    INTRAMEDULLARY (IM) NAIL INTERTROCHANTERIC Right 06/24/2023   Procedure: INTRAMEDULLARY (IM) NAIL INTERTROCHANTERIC;  Surgeon: Wilhelmenia Harada, MD;  Location: [facility] OR;  Service: Orthopedics;  Laterality: Right;   ORIF SHOULDER FRACTURE Left    REVERSE SHOULDER ARTHROPLASTY Right 05/10/2023   Procedure: REVERSE SHOULDER ARTHROPLASTY;  Surgeon: Jasmine Mesi, MD;  Location: [facility] OR;  Service: Orthopedics;  Laterality: Right;   TONSILLECTOMY     Social  History   Occupational History   Not on file  Tobacco Use   Smoking status: Never   Smokeless tobacco: Never  Vaping Use   Vaping status: Never Used  Substance and Sexual Activity   Alcohol use: Yes    Alcohol/week: 5.0 standard drinks of alcohol    Types: 5 Glasses of wine per week   Drug use: Never   Sexual activity: Not on file

## 2023-09-16 ENCOUNTER — Encounter: Payer: Self-pay | Admitting: Orthopedic Surgery

## 2023-09-16 MED ORDER — LIDOCAINE HCL 1 % IJ SOLN
5.0000 mL | INTRAMUSCULAR | Status: AC | PRN
  Administered 2023-09-12: 5 mL

## 2023-09-16 MED ORDER — BUPIVACAINE HCL 0.5 % IJ SOLN
9.0000 mL | INTRAMUSCULAR | Status: AC | PRN
Start: 2023-09-12 — End: 2023-09-12
  Administered 2023-09-12: 9 mL via INTRA_ARTICULAR

## 2023-09-16 MED ORDER — BUPIVACAINE HCL 0.25 % IJ SOLN
4.0000 mL | INTRAMUSCULAR | Status: AC | PRN
  Administered 2023-09-12: 4 mL via INTRA_ARTICULAR

## 2023-09-19 ENCOUNTER — Ambulatory Visit (HOSPITAL_BASED_OUTPATIENT_CLINIC_OR_DEPARTMENT_OTHER): Admitting: Orthopaedic Surgery

## 2023-09-19 ENCOUNTER — Encounter (HOSPITAL_BASED_OUTPATIENT_CLINIC_OR_DEPARTMENT_OTHER): Admitting: Orthopaedic Surgery

## 2023-09-19 ENCOUNTER — Ambulatory Visit (INDEPENDENT_AMBULATORY_CARE_PROVIDER_SITE_OTHER): Admitting: Physical Medicine and Rehabilitation

## 2023-09-19 ENCOUNTER — Other Ambulatory Visit: Payer: Self-pay

## 2023-09-19 DIAGNOSIS — M5416 Radiculopathy, lumbar region: Secondary | ICD-10-CM

## 2023-09-19 MED ORDER — METHYLPREDNISOLONE ACETATE 40 MG/ML IJ SUSP
40.0000 mg | Freq: Once | INTRAMUSCULAR | Status: AC
  Administered 2023-09-19: 40 mg

## 2023-09-19 NOTE — Progress Notes (Unsigned)
 Pain Scale   Average Pain 0    Only has pain when she bends to the right     +Driver, -BT, -Dye Allergies.

## 2023-09-19 NOTE — Patient Instructions (Signed)

## 2023-09-26 NOTE — Procedures (Signed)
 Lumbar Epidural Steroid Injection - Interlaminar Approach with Fluoroscopic Guidance  Patient: Bailey Petersen      Date of Birth: 25-Nov-1940 MRN: [id number] PCP: Dorrine Gaudy, DO      Visit Date: 09/19/2023   Universal Protocol:     Consent Given By: the patient  Position: PRONE  Additional Comments: Vital signs were monitored before and after the procedure. Patient was prepped and draped in the usual sterile fashion. The correct patient, procedure, and site was verified.   Injection Procedure Details:   Procedure diagnoses: Lumbar radiculopathy [M54.16]   Meds Administered:  Meds ordered this encounter  Medications   methylPREDNISolone  acetate (DEPO-MEDROL ) injection 40 mg     Laterality: Right  Location/Site:  L5-S1  Needle: 3.5 in., 20 ga. Tuohy  Needle Placement: Paramedian epidural  Findings:   -Comments: Excellent flow of contrast into the epidural space.  Procedure Details: Using a paramedian approach from the side mentioned above, the region overlying the inferior lamina was localized under fluoroscopic visualization and the soft tissues overlying this structure were infiltrated with 4 ml. of 1% Lidocaine  without Epinephrine. The Tuohy needle was inserted into the epidural space using a paramedian approach.   The epidural space was localized using loss of resistance along with counter oblique bi-planar fluoroscopic views.  After negative aspirate for air, blood, and CSF, a 2 ml. volume of Isovue -250 was injected into the epidural space and the flow of contrast was observed. Radiographs were obtained for documentation purposes.    The injectate was administered into the level noted above.   Additional Comments:  The patient tolerated the procedure well Dressing: 2 x 2 sterile gauze and Band-Aid    Post-procedure details: Patient was observed during the procedure. Post-procedure instructions were reviewed.  Patient left the clinic in stable condition.

## 2023-09-26 NOTE — Progress Notes (Signed)
 Bailey Petersen - 83 y.o. female MRN [id number]  Date of birth: 27-Mar-1941  Office Visit Note: Visit Date: 09/19/2023 PCP: Dorrine Gaudy, DO Referred by: Dorrine Gaudy, DO  Subjective: Chief Complaint  Patient presents with   Lower Back - Pain   HPI:  Bailey Petersen is a 83 y.o. female who comes in today at the request of Dr. Marykay Snipes for planned Right L5-S1 Lumbar Interlaminar epidural steroid injection with fluoroscopic guidance.  The patient has failed conservative care including home exercise, medications, time and activity modification.  This injection will be diagnostic and hopefully therapeutic.  Please see requesting physician notes for further details and justification.   ROS Otherwise per HPI.  Assessment & Plan: Visit Diagnoses:    ICD-10-CM   1. Lumbar radiculopathy  M54.16 XR C-ARM NO REPORT    Epidural Steroid injection    methylPREDNISolone  acetate (DEPO-MEDROL ) injection 40 mg      Plan: No additional findings.   Meds & Orders:  Meds ordered this encounter  Medications   methylPREDNISolone  acetate (DEPO-MEDROL ) injection 40 mg    Orders Placed This Encounter  Procedures   XR C-ARM NO REPORT   Epidural Steroid injection    Follow-up: Return for visit to requesting provider as needed.   Procedures: No procedures performed  Lumbar Epidural Steroid Injection - Interlaminar Approach with Fluoroscopic Guidance  Patient: Bailey Petersen      Date of Birth: Dec 27, 1940 MRN: [id number] PCP: Dorrine Gaudy, DO      Visit Date: 09/19/2023   Universal Protocol:     Consent Given By: the patient  Position: PRONE  Additional Comments: Vital signs were monitored before and after the procedure. Patient was prepped and draped in the usual sterile fashion. The correct patient, procedure, and site was verified.   Injection Procedure Details:   Procedure diagnoses: Lumbar radiculopathy [M54.16]   Meds Administered:  Meds ordered this encounter   Medications   methylPREDNISolone  acetate (DEPO-MEDROL ) injection 40 mg     Laterality: Right  Location/Site:  L5-S1  Needle: 3.5 in., 20 ga. Tuohy  Needle Placement: Paramedian epidural  Findings:   -Comments: Excellent flow of contrast into the epidural space.  Procedure Details: Using a paramedian approach from the side mentioned above, the region overlying the inferior lamina was localized under fluoroscopic visualization and the soft tissues overlying this structure were infiltrated with 4 ml. of 1% Lidocaine  without Epinephrine. The Tuohy needle was inserted into the epidural space using a paramedian approach.   The epidural space was localized using loss of resistance along with counter oblique bi-planar fluoroscopic views.  After negative aspirate for air, blood, and CSF, a 2 ml. volume of Isovue -250 was injected into the epidural space and the flow of contrast was observed. Radiographs were obtained for documentation purposes.    The injectate was administered into the level noted above.   Additional Comments:  The patient tolerated the procedure well Dressing: 2 x 2 sterile gauze and Band-Aid    Post-procedure details: Patient was observed during the procedure. Post-procedure instructions were reviewed.  Patient left the clinic in stable condition.   Clinical History: MRI LUMBAR SPINE WITHOUT CONTRAST   TECHNIQUE: Multiplanar, multisequence MR imaging of the lumbar spine was performed. No intravenous contrast was administered.   COMPARISON:  MR lumbar 04/02/2017.   FINDINGS: Segmentation:  Standard.   Alignment: Grade 1 anterolisthesis of L5 on S1. Grade 1 anterolisthesis of L3 on L4 and L4 on L5 to a lesser  extent. S-shaped scoliosis of the thoracolumbar spine.   Vertebrae: No acute fracture, evidence of discitis, or aggressive bone lesion.   Conus medullaris and cauda equina: Conus extends to the L2 level. Conus and cauda equina appear normal.    Paraspinal and other soft tissues: No acute paraspinal abnormality.   Disc levels:   Disc spaces: Degenerative disease with disc height loss throughout the lumbar spine.   T12-L1: Broad-based disc bulge. Mild left foraminal stenosis. No right foraminal stenosis. No central canal stenosis.   L1-L2: Mild broad-based disc bulge. Mild spinal stenosis. Moderate-severe right foraminal stenosis. Mild left foraminal stenosis.   L2-L3: Broad-based disc bulge. Mild bilateral facet arthropathy. Mild spinal stenosis. Right lateral recess stenosis. Moderate right foraminal stenosis. Mild left foraminal stenosis.   L3-L4: Mild broad-based disc bulge. Moderate bilateral facet arthropathy. Mild spinal stenosis. Mild bilateral foraminal stenosis.   L4-L5: Mild broad-based disc bulge. Moderate bilateral facet arthropathy. Right subarticular recess stenosis. Severe right foraminal stenosis. Mild left foraminal stenosis.   L5-S1: Broad-based disc bulge. Severe bilateral facet arthropathy. Moderate spinal stenosis. Severe bilateral foraminal stenosis. Bilateral subarticular recess stenosis.   IMPRESSION: 1. Lumbar spine spondylosis as described above. 2. No acute osseous injury of the lumbar spine.     Electronically Signed   By: Onnie Bilis M.D.   On: 03/02/2021 09:35     Objective:  VS:  HT:    WT:   BMI:     BP:   HR: bpm  TEMP: ( )  RESP:  Physical Exam Vitals and nursing note reviewed.  Constitutional:      General: She is not in acute distress.    Appearance: Normal appearance. She is not ill-appearing.  HENT:     Head: Normocephalic and atraumatic.     Right Ear: External ear normal.     Left Ear: External ear normal.  Eyes:     Extraocular Movements: Extraocular movements intact.  Cardiovascular:     Rate and Rhythm: Normal rate.     Pulses: Normal pulses.  Pulmonary:     Effort: Pulmonary effort is normal. No respiratory distress.  Abdominal:     General: There  is no distension.     Palpations: Abdomen is soft.  Musculoskeletal:        General: Tenderness present.     Cervical back: Neck supple.     Right lower leg: No edema.     Left lower leg: No edema.     Comments: Patient has good distal strength with no pain over the greater trochanters.  No clonus or focal weakness.  Skin:    Findings: No erythema, lesion or rash.  Neurological:     General: No focal deficit present.     Mental Status: She is alert and oriented to person, place, and time.     Sensory: No sensory deficit.     Motor: No weakness or abnormal muscle tone.     Coordination: Coordination normal.  Psychiatric:        Mood and Affect: Mood normal.        Behavior: Behavior normal.      Imaging: No results found.

## 2023-10-03 ENCOUNTER — Ambulatory Visit (HOSPITAL_BASED_OUTPATIENT_CLINIC_OR_DEPARTMENT_OTHER)

## 2023-10-03 ENCOUNTER — Ambulatory Visit (HOSPITAL_BASED_OUTPATIENT_CLINIC_OR_DEPARTMENT_OTHER): Admitting: Orthopaedic Surgery

## 2023-10-03 DIAGNOSIS — S72141A Displaced intertrochanteric fracture of right femur, initial encounter for closed fracture: Secondary | ICD-10-CM

## 2023-10-03 NOTE — Progress Notes (Signed)
 Post Operative Evaluation    Procedure/Date of Surgery: Right hip cephalomedullary nailing  Interval History:    Presents today 12 weeks status post right hip cephalomedullary nailing.  Overall she is continuing to improve.  Her walking is much improved.  She has been working with a heel lift in the right side due to mild leg length discrepancy   PMH/PSH/Family History/Social History/Meds/Allergies:    Past Medical History:  Diagnosis Date   Arthritis    RA   Complication of anesthesia    GERD (gastroesophageal reflux disease)    History of hiatal hernia    IBS (irritable bowel syndrome)    PONV (postoperative nausea and vomiting)    Rheumatoid arthritis (HCC)    Past Surgical History:  Procedure Laterality Date   ABDOMINAL HYSTERECTOMY     BICEPT TENODESIS Right 05/10/2023   Procedure: BICEPS TENODESIS;  Surgeon: Jasmine Mesi, MD;  Location: [facility] OR;  Service: Orthopedics;  Laterality: Right;   EYE SURGERY Bilateral    cataract   FINGER ARTHROPLASTY Right 03/23/2020   Procedure: Right index finger metacarpal phalangeal arthroplasty with repair as necessary and centralization of the extensor tendon and right middle finger proximal interphalangeal arthroplasty with repair as necessary;  Surgeon: Ronn Cohn, MD;  Location: [facility];  Service: Orthopedics;  Laterality: Right;   FOOT SURGERY Left    HAND SURGERY Left    INTRAMEDULLARY (IM) NAIL INTERTROCHANTERIC Right 06/24/2023   Procedure: INTRAMEDULLARY (IM) NAIL INTERTROCHANTERIC;  Surgeon: Wilhelmenia Harada, MD;  Location: [facility] OR;  Service: Orthopedics;  Laterality: Right;   ORIF SHOULDER FRACTURE Left    REVERSE SHOULDER ARTHROPLASTY Right 05/10/2023   Procedure: REVERSE SHOULDER ARTHROPLASTY;  Surgeon: Jasmine Mesi, MD;  Location: [facility] OR;  Service: Orthopedics;  Laterality: Right;   TONSILLECTOMY     Social History   Socioeconomic History   Marital  status: Married    Spouse name: Not on file   Number of children: 1   Years of education: Not on file   Highest education level: Not on file  Occupational History   Not on file  Tobacco Use   Smoking status: Never   Smokeless tobacco: Never  Vaping Use   Vaping status: Never Used  Substance and Sexual Activity   Alcohol use: Yes    Alcohol/week: 5.0 standard drinks of alcohol    Types: 5 Glasses of wine per week   Drug use: Never   Sexual activity: Not on file  Other Topics Concern   Not on file  Social History Narrative   Not on file   Social Drivers of Health   Financial Resource Strain: Not on file  Food Insecurity: No Food Insecurity (06/26/2023)   Hunger Vital Sign    Worried About Running Out of Food in the Last Year: Never true    Ran Out of Food in the Last Year: Never true  Transportation Needs: No Transportation Needs (06/26/2023)   PRAPARE - Administrator, Civil Service (Medical): No    Lack of Transportation (Non-Medical): No  Physical Activity: Not on file  Stress: Not on file  Social Connections: Socially Integrated (06/24/2023)   Social Connection and Isolation Panel [NHANES]    Frequency of Communication with Friends and Family: Three times a week    Frequency of  Social Gatherings with Friends and Family: Three times a week    Attends Religious Services: More than 4 times per year    Active Member of Clubs or Organizations: Yes    Attends Engineer, structural: More than 4 times per year    Marital Status: Married   Family History  Problem Relation Age of Onset   Rheumatic fever Mother    Allergies  Allergen Reactions   Hydrocodone  Shortness Of Breath    Throat Swelling/ diffuse rash   Oxycodone  Nausea Only    Tolerates morphine    Penicillins Swelling    reddness   Celebrex  [Celecoxib ]     nausea   Sulfa Antibiotics    Current Outpatient Medications  Medication Sig Dispense Refill   acetaminophen  (TYLENOL ) 500 MG tablet  Take 1 tablet (500 mg total) by mouth 4 (four) times daily -  with meals and at bedtime.     Ascorbic Acid (VITAMIN C GUMMIES PO) Take 2 each by mouth daily.     cetirizine (ZYRTEC) 10 MG tablet Take 10 mg by mouth daily as needed for allergies.     Cholecalciferol  (VITAMIN D3 PO) Take 1 tablet by mouth daily.     diclofenac  Sodium (VOLTAREN ) 1 % GEL Apply 2 g topically 4 (four) times daily. To up to three affected joints 350 g 0   docusate sodium  (COLACE) 100 MG capsule Take 1 capsule (100 mg total) by mouth 2 (two) times daily. 60 capsule 0   folic acid  (FOLVITE ) 1 MG tablet Take 1 mg by mouth daily.     glucosamine-chondroitin 500-400 MG tablet Take 1 tablet by mouth daily.     sulfaSALAzine  (AZULFIDINE ) 500 MG tablet Take 1,500 mg by mouth daily.     traMADol  (ULTRAM ) 50 MG tablet Take 1 tablet (50 mg total) by mouth 4 (four) times daily as needed for severe pain (pain score 7-10). 28 tablet 0   Turmeric 500 MG CAPS Take 500 mg by mouth daily.     No current facility-administered medications for this visit.   No results found.   Review of Systems:   A ROS was performed including pertinent positives and negatives as documented in the HPI.   Musculoskeletal Exam:    There were no vitals taken for this visit.  Right hip incision is well-appearing without erythema or drainage.  Walks with mildly antalgic gait.  Distal neurosensory exam is intact.  Painless range of motion about the knee  Imaging:    3 views right femur: Status post cephalomedullary nailing without evidence of complication  I personally reviewed and interpreted the radiographs.   Assessment:   12 weeks status post right hip cephalomedullary nailing without complication overall doing well.  I did discuss that she does have a less than 1 cm leg length discrepancy although I would expect this to improve symptomatically as she gets stronger with regard to the right hip.  I will plan to see her back in 6 weeks for final  check  Plan :    -Clinic 6 weeks for reassessment      I personally saw and evaluated the patient, and participated in the management and treatment plan.  Wilhelmenia Harada, MD Attending Physician, Orthopedic Surgery  This document was dictated using Dragon voice recognition software. A reasonable attempt at proof reading has been made to minimize errors.

## 2023-10-24 ENCOUNTER — Ambulatory Visit: Admitting: Surgical

## 2023-10-29 ENCOUNTER — Ambulatory Visit (INDEPENDENT_AMBULATORY_CARE_PROVIDER_SITE_OTHER): Admitting: Surgical

## 2023-10-29 ENCOUNTER — Encounter: Payer: Self-pay | Admitting: Surgical

## 2023-10-29 DIAGNOSIS — M19012 Primary osteoarthritis, left shoulder: Secondary | ICD-10-CM | POA: Diagnosis not present

## 2023-10-29 DIAGNOSIS — Z96611 Presence of right artificial shoulder joint: Secondary | ICD-10-CM

## 2023-10-29 NOTE — Progress Notes (Signed)
 Post-Op Visit Note   Patient: Bailey Petersen           Date of Birth: November 24, 1940           MRN: [id number] Visit Date: 10/29/2023 PCP: Henriette Anes, DO   Assessment & Plan:  Chief Complaint:  Chief Complaint  Patient presents with   Right Shoulder - Follow-up    RSA 05-10-23   Visit Diagnoses: No diagnosis found.  Plan: Patient is a 83 year old female who presents s/p right reverse shoulder arthroplasty with bicep tenodesis on 05/10/2023.  Overall she feels she is doing better and better.  She keeps her shoulder moving because it gets stiff if she keeps it stagnant.  She is not taking any pain medication.  She does have some limited motion with reaching behind her.  Overall she is very happy with how her right shoulder is doing.  She also has recently had left shoulder glenohumeral injection with Dr. Addie and this has done very well for her; she currently has no symptoms from her left shoulder.  On exam of the right shoulder, she has 30 degrees X rotation, 90 degrees abduction, 165 degrees forward elevation passively and actively.  Axillary nerve intact with deltoid firing.  Incisions well-healed.  No Popeye deformity.  Good rotator cuff strength rated 5/5 of supra, infra, subscap.  Plan at this time is activity as tolerated for the right shoulder with no lifting more than 20 pounds.  Recommended she avoid trying to force reaching behind her to avoid instability.  Left shoulder doing very well after injection and we can do this about every 4 months as needed.  Follow-Up Instructions: No follow-ups on file.   Orders:  No orders of the defined types were placed in this encounter.  No orders of the defined types were placed in this encounter.   Imaging: No results found.  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   Past Medical History:  Diagnosis Date   Arthritis    RA   Complication of anesthesia    GERD (gastroesophageal reflux disease)    History of hiatal hernia    IBS (irritable bowel syndrome)    PONV (postoperative nausea and vomiting)    Rheumatoid arthritis (HCC)     Family History  Problem Relation Age of Onset   Rheumatic fever Mother     Past Surgical History:  Procedure Laterality Date   ABDOMINAL HYSTERECTOMY     BICEPT TENODESIS Right 05/10/2023   Procedure: BICEPS TENODESIS;  Surgeon: Addie Cordella Hamilton, MD;  Location: [facility] OR;  Service: Orthopedics;  Laterality: Right;   EYE SURGERY Bilateral    cataract   FINGER ARTHROPLASTY Right 03/23/2020   Procedure: Right index finger metacarpal phalangeal arthroplasty with repair as necessary and centralization of the extensor tendon and right middle finger proximal interphalangeal arthroplasty with repair as necessary;  Surgeon: Camella Fallow, MD;  Location: [facility];  Service: Orthopedics;  Laterality: Right;   FOOT SURGERY Left    HAND SURGERY Left    INTRAMEDULLARY (IM) NAIL INTERTROCHANTERIC Right 06/24/2023   Procedure:  INTRAMEDULLARY (IM) NAIL INTERTROCHANTERIC;  Surgeon: Genelle Standing, MD;  Location: [facility] OR;  Service: Orthopedics;  Laterality: Right;   ORIF SHOULDER FRACTURE Left    REVERSE SHOULDER ARTHROPLASTY Right 05/10/2023   Procedure: REVERSE SHOULDER ARTHROPLASTY;  Surgeon: Addie Cordella Hamilton, MD;  Location: [facility] OR;  Service: Orthopedics;  Laterality: Right;   TONSILLECTOMY     Social History   Occupational History   Not on file  Tobacco Use   Smoking status: Never   Smokeless tobacco: Never  Vaping Use   Vaping status: Never  Used  Substance and Sexual Activity   Alcohol use: Yes    Alcohol/week: 5.0 standard drinks of alcohol    Types: 5 Glasses of wine per week   Drug use: Never   Sexual activity: Not on file

## 2023-11-14 ENCOUNTER — Ambulatory Visit (HOSPITAL_BASED_OUTPATIENT_CLINIC_OR_DEPARTMENT_OTHER): Admitting: Orthopaedic Surgery

## 2023-11-14 DIAGNOSIS — S72141A Displaced intertrochanteric fracture of right femur, initial encounter for closed fracture: Secondary | ICD-10-CM | POA: Diagnosis not present

## 2023-11-14 DIAGNOSIS — M810 Age-related osteoporosis without current pathological fracture: Secondary | ICD-10-CM

## 2023-11-14 NOTE — Progress Notes (Signed)
 Post Operative Evaluation    Procedure/Date of Surgery: Right hip cephalomedullary nailing  Interval History:    Presents today status post right hip cephalomedullary nailing.  Overall she is continuing to improve.  Her walking is much improved.  She occasionally has some soreness upon standing up although this is mild  PMH/PSH/Family History/Social History/Meds/Allergies:    Past Medical History:  Diagnosis Date   Arthritis    RA   Complication of anesthesia    GERD (gastroesophageal reflux disease)    History of hiatal hernia    IBS (irritable bowel syndrome)    PONV (postoperative nausea and vomiting)    Rheumatoid arthritis (HCC)    Past Surgical History:  Procedure Laterality Date   ABDOMINAL HYSTERECTOMY     BICEPT TENODESIS Right 05/10/2023   Procedure: BICEPS TENODESIS;  Surgeon: Addie Cordella Hamilton, MD;  Location: [facility] OR;  Service: Orthopedics;  Laterality: Right;   EYE SURGERY Bilateral    cataract   FINGER ARTHROPLASTY Right 03/23/2020   Procedure: Right index finger metacarpal phalangeal arthroplasty with repair as necessary and centralization of the extensor tendon and right middle finger proximal interphalangeal arthroplasty with repair as necessary;  Surgeon: Camella Fallow, MD;  Location: [facility];  Service: Orthopedics;  Laterality: Right;   FOOT SURGERY Left    HAND SURGERY Left    INTRAMEDULLARY (IM) NAIL INTERTROCHANTERIC Right 06/24/2023   Procedure: INTRAMEDULLARY (IM) NAIL INTERTROCHANTERIC;  Surgeon: Genelle Standing, MD;  Location: [facility] OR;  Service: Orthopedics;  Laterality: Right;   ORIF SHOULDER FRACTURE Left    REVERSE SHOULDER ARTHROPLASTY Right 05/10/2023   Procedure: REVERSE SHOULDER ARTHROPLASTY;  Surgeon: Addie Cordella Hamilton, MD;  Location: [facility] OR;  Service: Orthopedics;  Laterality: Right;   TONSILLECTOMY     Social History   Socioeconomic History   Marital status: Married    Spouse name:  Not on file   Number of children: 1   Years of education: Not on file   Highest education level: Not on file  Occupational History   Not on file  Tobacco Use   Smoking status: Never   Smokeless tobacco: Never  Vaping Use   Vaping status: Never Used  Substance and Sexual Activity   Alcohol use: Yes    Alcohol/week: 5.0 standard drinks of alcohol    Types: 5 Glasses of wine per week   Drug use: Never   Sexual activity: Not on file  Other Topics Concern   Not on file  Social History Narrative   Not on file   Social Drivers of Health   Financial Resource Strain: Not on file  Food Insecurity: No Food Insecurity (06/26/2023)   Hunger Vital Sign    Worried About Running Out of Food in the Last Year: Never true    Ran Out of Food in the Last Year: Never true  Transportation Needs: No Transportation Needs (06/26/2023)   PRAPARE - Administrator, Civil Service (Medical): No    Lack of Transportation (Non-Medical): No  Physical Activity: Not on file  Stress: Not on file  Social Connections: Socially Integrated (06/24/2023)   Social Connection and Isolation Panel    Frequency of Communication with Friends and Family: Three times a week    Frequency of Social Gatherings with Friends and Family: Three times a week  Attends Religious Services: More than 4 times per year    Active Member of Clubs or Organizations: Yes    Attends Engineer, structural: More than 4 times per year    Marital Status: Married   Family History  Problem Relation Age of Onset   Rheumatic fever Mother    Allergies  Allergen Reactions   Hydrocodone  Shortness Of Breath    Throat Swelling/ diffuse rash   Oxycodone  Nausea Only    Tolerates morphine    Penicillins Swelling    reddness   Celebrex  [Celecoxib ]     nausea   Sulfa Antibiotics    Current Outpatient Medications  Medication Sig Dispense Refill   acetaminophen  (TYLENOL ) 500 MG tablet Take 1 tablet (500 mg total) by mouth 4  (four) times daily -  with meals and at bedtime.     Ascorbic Acid (VITAMIN C GUMMIES PO) Take 2 each by mouth daily.     cetirizine (ZYRTEC) 10 MG tablet Take 10 mg by mouth daily as needed for allergies.     Cholecalciferol  (VITAMIN D3 PO) Take 1 tablet by mouth daily.     diclofenac  Sodium (VOLTAREN ) 1 % GEL Apply 2 g topically 4 (four) times daily. To up to three affected joints 350 g 0   docusate sodium  (COLACE) 100 MG capsule Take 1 capsule (100 mg total) by mouth 2 (two) times daily. 60 capsule 0   folic acid  (FOLVITE ) 1 MG tablet Take 1 mg by mouth daily.     glucosamine-chondroitin 500-400 MG tablet Take 1 tablet by mouth daily.     sulfaSALAzine  (AZULFIDINE ) 500 MG tablet Take 1,500 mg by mouth daily.     traMADol  (ULTRAM ) 50 MG tablet Take 1 tablet (50 mg total) by mouth 4 (four) times daily as needed for severe pain (pain score 7-10). 28 tablet 0   Turmeric 500 MG CAPS Take 500 mg by mouth daily.     No current facility-administered medications for this visit.   No results found.   Review of Systems:   A ROS was performed including pertinent positives and negatives as documented in the HPI.   Musculoskeletal Exam:    There were no vitals taken for this visit.  Right hip incision is well-appearing without erythema or drainage.  Walks with mildly antalgic gait.  Distal neurosensory exam is intact.  Painless range of motion about the knee  Imaging:    3 views right femur: Status post cephalomedullary nailing without evidence of complication  I personally reviewed and interpreted the radiographs.   Assessment:   Status post right hip cephalomedullary nailing without complication overall doing well.  At this time she has made essentially a full recovery regarding her right leg.  I would like to plan to make a referral for [facility] persons for discussion of osteoporosis in the setting of an osteoporotic fracture  Plan :    - Return to clinic as needed      I  personally saw and evaluated the patient, and participated in the management and treatment plan.  Elspeth Parker, MD Attending Physician, Orthopedic Surgery  This document was dictated using Dragon voice recognition software. A reasonable attempt at proof reading has been made to minimize errors.

## 2023-11-22 DIAGNOSIS — M0630 Rheumatoid nodule, unspecified site: Secondary | ICD-10-CM | POA: Insufficient documentation

## 2023-12-06 ENCOUNTER — Encounter (HOSPITAL_BASED_OUTPATIENT_CLINIC_OR_DEPARTMENT_OTHER): Payer: Self-pay | Admitting: Family Medicine

## 2023-12-06 ENCOUNTER — Ambulatory Visit (INDEPENDENT_AMBULATORY_CARE_PROVIDER_SITE_OTHER): Admitting: Family Medicine

## 2023-12-06 VITALS — BP 128/70 | HR 78 | Ht 63.0 in | Wt 129.2 lb

## 2023-12-06 DIAGNOSIS — M069 Rheumatoid arthritis, unspecified: Secondary | ICD-10-CM | POA: Diagnosis not present

## 2023-12-06 DIAGNOSIS — Z13220 Encounter for screening for lipoid disorders: Secondary | ICD-10-CM | POA: Diagnosis not present

## 2023-12-06 DIAGNOSIS — K440 Diaphragmatic hernia with obstruction, without gangrene: Secondary | ICD-10-CM | POA: Insufficient documentation

## 2023-12-06 NOTE — Progress Notes (Signed)
 New Patient Office Visit  Subjective   Patient ID: Bailey Petersen, female    DOB: 1941-01-01  Age: 83 y.o. MRN: [id number]  CC:  Chief Complaint  Patient presents with   New Patient (Initial Visit)    New patient - was being seen in [facility] Dr Henriette just establishing care     HPI Bailey Petersen presents to establish care Last PCP - Dr. Henriette in [place], [place].  Was seeing PCP office for routine follow-up, health maintenance screenings.  Rheumatoid arthritis: follows with rheumatology, has had difficulty with various medications. Currently utilizing sulfasalazine  and naproxen .  She has had prior surgery for shoulder arthroplasty.  She also had surgical intervention with Dr. Genelle due to hip fracture.  As result of fracture, she does have appointment upcoming with osteoporosis clinic.  Patient is originally from [place], [place]. 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  Outpatient Encounter Medications as of 12/06/2023  Medication Sig   acetaminophen  (TYLENOL ) 500 MG tablet Take 1 tablet (500 mg total) by mouth 4 (four) times daily -  with meals and at bedtime.   Ascorbic Acid (VITAMIN C GUMMIES PO) Take 2 each by mouth daily.   cetirizine (ZYRTEC) 10 MG tablet Take 10 mg by mouth daily as needed for allergies.   Cholecalciferol  (VITAMIN D3 PO) Take 1 tablet by mouth daily.   diclofenac  Sodium (VOLTAREN ) 1 % GEL Apply 2 g topically 4 (four) times daily. To up to three affected joints   docusate sodium  (COLACE) 100 MG capsule Take 1 capsule (100 mg total) by mouth 2 (two) times daily.   glucosamine-chondroitin 500-400 MG tablet Take 1 tablet by mouth daily.   naproxen  (NAPROSYN ) 500 MG tablet 500 mg by oral route.   sulfaSALAzine  (AZULFIDINE ) 500 MG tablet Take 1,500 mg by mouth daily.   Turmeric 500 MG CAPS Take 500 mg by mouth daily.   [DISCONTINUED] traMADol  (ULTRAM ) 50 MG tablet Take 1 tablet (50 mg  total) by mouth 4 (four) times daily as needed for severe pain (pain score 7-10).   folic acid  (FOLVITE ) 1 MG tablet Take 1 mg by mouth daily. (Patient not taking: Reported on 12/06/2023)   No facility-administered encounter medications on file as of 12/06/2023.    Past Medical History:  Diagnosis Date   Allergy    Arthritis    RA   Cataract    Complication of anesthesia    GERD (gastroesophageal reflux disease)    History of hiatal hernia    IBS (irritable bowel syndrome)    PONV (postoperative nausea and vomiting)    Rheumatoid arthritis (HCC)     Past Surgical History:  Procedure Laterality Date   ABDOMINAL HYSTERECTOMY     BICEPT TENODESIS Right 05/10/2023   Procedure: BICEPS TENODESIS;  Surgeon: Addie Cordella Hamilton, MD;  Location: [facility] OR;  Service: Orthopedics;  Laterality: Right;   COSMETIC SURGERY     EYE SURGERY Bilateral    cataract   FINGER ARTHROPLASTY Right 03/23/2020   Procedure: Right index finger metacarpal phalangeal arthroplasty with repair as necessary and centralization of the extensor tendon and right middle finger proximal interphalangeal arthroplasty with repair as necessary;  Surgeon: Camella Fallow, MD;  Location: [facility];  Service: Orthopedics;  Laterality: Right;   FOOT SURGERY Left    FRACTURE SURGERY     HAND SURGERY Left    INTRAMEDULLARY (IM) NAIL INTERTROCHANTERIC Right 06/24/2023  Procedure: INTRAMEDULLARY (IM) NAIL INTERTROCHANTERIC;  Surgeon: Genelle Standing, MD;  Location: [facility] OR;  Service: Orthopedics;  Laterality: Right;   JOINT REPLACEMENT     ORIF SHOULDER FRACTURE Left    REVERSE SHOULDER ARTHROPLASTY Right 05/10/2023   Procedure: REVERSE SHOULDER ARTHROPLASTY;  Surgeon: Addie Cordella Hamilton, MD;  Location: [facility] OR;  Service: Orthopedics;  Laterality: Right;   TONSILLECTOMY     TUBAL LIGATION      Family History  Problem Relation Age of Onset   Rheumatic fever Mother    Early death Mother    Arthritis Maternal  Grandmother     Social History   Socioeconomic History   Marital status: Married    Spouse name: Not on file   Number of children: 1   Years of education: Not on file   Highest education level: Master's degree (e.g., MA, MS, MEng, MEd, MSW, MBA)  Occupational History   Not on file  Tobacco Use   Smoking status: Former    Current packs/day: 0.00    Average packs/day: 0.5 packs/day for 2.0 years (1.0 ttl pk-yrs)    Types: Cigarettes    Quit date: 06/12/1965    Years since quitting: 58.5    Passive exposure: Past   Smokeless tobacco: Never  Vaping Use   Vaping status: Never Used  Substance and Sexual Activity   Alcohol use: Yes    Alcohol/week: 3.0 standard drinks of alcohol    Types: 3 Glasses of wine per week   Drug use: Never   Sexual activity: Not Currently    Birth control/protection: Post-menopausal, Surgical  Other Topics Concern   Not on file  Social History Narrative   Not on file   Social Drivers of Health   Financial Resource Strain: Low Risk  (12/04/2023)   Overall Financial Resource Strain (CARDIA)    Difficulty of Paying Living Expenses: Not hard at all  Food Insecurity: No Food Insecurity (12/04/2023)   Hunger Vital Sign    Worried About Running Out of Food in the Last Year: Never true    Ran Out of Food in the Last Year: Never true  Transportation Needs: No Transportation Needs (12/04/2023)   PRAPARE - Administrator, Civil Service (Medical): No    Lack of Transportation (Non-Medical): No  Physical Activity: Sufficiently Active (12/04/2023)   Exercise Vital Sign    Days of Exercise per Week: 5 days    Minutes of Exercise per Session: 40 min  Stress: No Stress Concern Present (12/04/2023)   [employer] of Occupational Health - Occupational Stress Questionnaire    Feeling of Stress: Only a little  Social Connections: Socially Integrated (12/04/2023)   Social Connection and Isolation Panel    Frequency of Communication with Friends and Family:  More than three times a week    Frequency of Social Gatherings with Friends and Family: Twice a week    Attends Religious Services: More than 4 times per year    Active Member of [employer] or Organizations: Yes    Attends Engineer, structural: More than 4 times per year    Marital Status: Married  Catering manager Violence: Not At Risk (06/26/2023)   Humiliation, Afraid, Rape, and Kick questionnaire    Fear of Current or Ex-Partner: No    Emotionally Abused: No    Physically Abused: No    Sexually Abused: No    Objective   BP 128/70 (BP Location: Left Arm, Patient Position: Sitting, Cuff Size: Normal)  Pulse 78   Ht 5' 3 (1.6 m)   Wt 129 lb 3.2 oz (58.6 kg)   SpO2 99%   BMI 22.89 kg/m   Physical Exam  83 year old female in no acute distress Cardiovascular exam with regular rate and rhythm Lungs clear to auscultation bilaterally  Assessment & Plan:   Rheumatoid arthritis, involving unspecified site, unspecified whether rheumatoid factor present [facility]) Assessment & Plan: Recommend continued follow-up with current rheumatologist.  Can continue with current medication regimen.  She thinks that she may want to establish with rheumatologist through [facility] health at some point in the future, however we will hold off on this for now.   Screening for lipid disorders -     Lipid panel; Future  Return in about 6 months (around 06/07/2024).    ___________________________________________ Bryna Razavi de [place], MD, ABFM, CAQSM Primary Care and Sports Medicine [facility]

## 2023-12-06 NOTE — Patient Instructions (Signed)

## 2023-12-06 NOTE — Assessment & Plan Note (Signed)
 Recommend continued follow-up with current rheumatologist.  Can continue with current medication regimen.  She thinks that she may want to establish with rheumatologist through [facility] health at some point in the future, however we will hold off on this for now.

## 2023-12-16 ENCOUNTER — Encounter: Payer: Self-pay | Admitting: Orthopedic Surgery

## 2023-12-17 ENCOUNTER — Ambulatory Visit: Admitting: Orthopedic Surgery

## 2023-12-26 ENCOUNTER — Ambulatory Visit: Admitting: Physician Assistant

## 2024-01-23 ENCOUNTER — Ambulatory Visit (INDEPENDENT_AMBULATORY_CARE_PROVIDER_SITE_OTHER): Admitting: Physician Assistant

## 2024-01-23 ENCOUNTER — Encounter: Payer: Self-pay | Admitting: Physician Assistant

## 2024-01-23 VITALS — Ht 63.0 in | Wt 132.0 lb

## 2024-01-23 DIAGNOSIS — M810 Age-related osteoporosis without current pathological fracture: Secondary | ICD-10-CM

## 2024-01-23 NOTE — Progress Notes (Signed)
 Office Visit Note   Patient: Bailey Petersen           Date of Birth: 1940-07-28           MRN: [id number] Visit Date: 01/23/2024              Requested by: Genelle Standing, MD [address] [place],  [place] [address] PCP: [place], Quintin PARAS, MD   Assessment & Plan: Visit Diagnoses:  1. Age-related osteoporosis without current pathological fracture     Plan: Patient is an 83 year old woman referred by Dr. Genelle for evaluation of osteoporosis.  She does not take anything currently in particular for osteoporosis.  She recently this year did have a hip fracture in 2025.  She has no history of heart disease or stroke no history of cancer she has no history of kidney disease or ulcers.  She has not had any gastric bypass she does have severe reflux no history of seizures.  She went through menopause when she was 51 she did have some hormone replacement therapy.  She does take calcium and drinks lots of milk.  She has vitamin D  at 5000 international units daily.  Both her calcium and vitamin D  levels are adequate.  She is a former smoker.  She drinks 3 glasses of wine a week.  She likes walking and up until her recent injury was doing some weights which she is going to be began again.  No history of dental issues no history of apparent spine fracture.  Given her history of hip fracture and she has actually fractured her shoulder in the past she is by definition osteoporotic along with the fact that she has rheumatoid arthritis.  I discussed with her possible treatments.  Because of her reflux I do not think Fosamax would be appropriate.  I do think she would do well with Prolia.  I spent over 45 minutes reviewing her chart and discussing in detail with her the side effects incidence of side effects and treatment.  She does have a bone scan scheduled for October I offered to go forward and try and authorize for Prolia but she would like to first find out where her bone density is in the  past many years ago she was osteopenic.  She will schedule appointment in my follow-up clinic to review the bone scan next month.  Based on that we can talk further about recommendations  Follow-Up Instructions: No follow-ups on file.   Orders:  No orders of the defined types were placed in this encounter.  No orders of the defined types were placed in this encounter.     Procedures: No procedures performed   Clinical Data: No additional findings.   Subjective: Chief Complaint  Patient presents with   Osteoporosis    HPI Bailey Petersen is a pleasant 83 year old woman who is referred for Dr. Brant Donalds for evaluation of osteoporosis she has a history of a hip fracture  Review of Systems  All other systems reviewed and are negative.    Objective: Vital Signs: Ht 5' 3 (1.6 m)   Wt 132 lb (59.9 kg)   BMI 23.38 kg/m   Physical Exam Constitutional:      Appearance: Normal appearance.  Pulmonary:     Effort: Pulmonary effort is normal.  Skin:    General: Skin is warm and dry.  Neurological:     General: No focal deficit present.     Mental Status: She is alert and oriented  to person, place, and time.       Specialty Comments:  MRI LUMBAR SPINE WITHOUT CONTRAST   TECHNIQUE: Multiplanar, multisequence MR imaging of the lumbar spine was performed. No intravenous contrast was administered.   COMPARISON:  MR lumbar 04/02/2017.   FINDINGS: Segmentation:  Standard.   Alignment: Grade 1 anterolisthesis of L5 on S1. Grade 1 anterolisthesis of L3 on L4 and L4 on L5 to a lesser extent. S-shaped scoliosis of the thoracolumbar spine.   Vertebrae: No acute fracture, evidence of discitis, or aggressive bone lesion.   Conus medullaris and cauda equina: Conus extends to the L2 level. Conus and cauda equina appear normal.   Paraspinal and other soft tissues: No acute paraspinal abnormality.   Disc levels:   Disc spaces: Degenerative disease with disc height loss  throughout the lumbar spine.   T12-L1: Broad-based disc bulge. Mild left foraminal stenosis. No right foraminal stenosis. No central canal stenosis.   L1-L2: Mild broad-based disc bulge. Mild spinal stenosis. Moderate-severe right foraminal stenosis. Mild left foraminal stenosis.   L2-L3: Broad-based disc bulge. Mild bilateral facet arthropathy. Mild spinal stenosis. Right lateral recess stenosis. Moderate right foraminal stenosis. Mild left foraminal stenosis.   L3-L4: Mild broad-based disc bulge. Moderate bilateral facet arthropathy. Mild spinal stenosis. Mild bilateral foraminal stenosis.   L4-L5: Mild broad-based disc bulge. Moderate bilateral facet arthropathy. Right subarticular recess stenosis. Severe right foraminal stenosis. Mild left foraminal stenosis.   L5-S1: Broad-based disc bulge. Severe bilateral facet arthropathy. Moderate spinal stenosis. Severe bilateral foraminal stenosis. Bilateral subarticular recess stenosis.   IMPRESSION: 1. Lumbar spine spondylosis as described above. 2. No acute osseous injury of the lumbar spine.     Electronically Signed   By: Julaine Blanch M.D.   On: 03/02/2021 09:35  Imaging: No results found.   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   Osteoarthritis of left knee 12/25/2022   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   Actinic keratosis 04/05/2022   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    Tenosynovitis of wrist 03/01/2022   Digital mucous cyst 12/21/2021   Sacroiliac joint pain 09/05/2021   Disorder of sacroiliac joint 08/30/2021   Encounter for orthopedic follow-up care 04/07/2020   Pain in both hands 02/12/2020   Abscess of skin 10/18/2019   Diarrhea 10/16/2017   Encounter for screening for lipoid disorders 10/16/2017   Atrophic vaginitis 10/16/2017   Rheumatoid arthritis of metacarpophalangeal joint (HCC) 10/16/2017   GERD (gastroesophageal reflux disease) 03/30/2017   Schatzki's ring 03/30/2017   Anemia 12/07/2016   Age-related osteoporosis without current pathological fracture 12/04/2011   Past Medical History:  Diagnosis Date   Allergy    Arthritis    RA   Cataract    Complication of anesthesia    GERD (gastroesophageal reflux disease)    History of hiatal hernia    IBS (irritable bowel syndrome)    PONV (postoperative nausea and vomiting)    Rheumatoid arthritis (HCC)     Family History  Problem Relation Age of Onset  Rheumatic fever Mother    Early death Mother    Arthritis Maternal Grandmother     Past Surgical History:  Procedure Laterality Date   ABDOMINAL HYSTERECTOMY     BICEPT TENODESIS Right 05/10/2023   Procedure: BICEPS TENODESIS;  Surgeon: Addie Cordella Hamilton, MD;  Location[facility] OR;  Service: Orthopedics;  Laterality: Right;   COSMETIC SURGERY     EYE SURGERY Bilateral    cataract   FINGER ARTHROPLASTY Right 03/23/2020   Procedure: Right index finger metacarpal phalangeal arthroplasty with repair as necessary and centralization of the extensor tendon and right middle finger proximal interphalangeal arthroplasty with repair as necessary;  Surgeon: Camella Fallow, MD;  Location: [facility];  Service: Orthopedics;  Laterality: Right;   FOOT SURGERY Left    FRACTURE SURGERY     HAND SURGERY Left    INTRAMEDULLARY (IM) NAIL INTERTROCHANTERIC Right 06/24/2023   Procedure: INTRAMEDULLARY (IM) NAIL INTERTROCHANTERIC;  Surgeon:  Genelle Standing, MD;  Location: [facility] OR;  Service: Orthopedics;  Laterality: Right;   JOINT REPLACEMENT     ORIF SHOULDER FRACTURE Left    REVERSE SHOULDER ARTHROPLASTY Right 05/10/2023   Procedure: REVERSE SHOULDER ARTHROPLASTY;  Surgeon: Addie Cordella Hamilton, MD;  Location: [facility] OR;  Service: Orthopedics;  Laterality: Right;   TONSILLECTOMY     TUBAL LIGATION     Social History   Occupational History   Not on file  Tobacco Use   Smoking status: Former    Current packs/day: 0.00    Average packs/day: 0.5 packs/day for 2.0 years (1.0 ttl pk-yrs)    Types: Cigarettes    Quit date: 06/12/1965    Years since quitting: 58.6    Passive exposure: Past   Smokeless tobacco: Never  Vaping Use   Vaping status: Never Used  Substance and Sexual Activity   Alcohol use: Yes    Alcohol/week: 3.0 standard drinks of alcohol    Types: 3 Glasses of wine per week   Drug use: Never   Sexual activity: Not Currently    Birth control/protection: Post-menopausal, Surgical

## 2024-02-06 ENCOUNTER — Ambulatory Visit (INDEPENDENT_AMBULATORY_CARE_PROVIDER_SITE_OTHER): Admitting: Family Medicine

## 2024-02-06 VITALS — Temp 97.6°F

## 2024-02-06 DIAGNOSIS — Z23 Encounter for immunization: Secondary | ICD-10-CM

## 2024-02-06 NOTE — Progress Notes (Signed)
 Patient is in office today for a nurse visit for Immunization. Patient Injection was given in the  Left deltoid. Patient tolerated injection well.

## 2024-02-13 ENCOUNTER — Ambulatory Visit (HOSPITAL_BASED_OUTPATIENT_CLINIC_OR_DEPARTMENT_OTHER)
Admission: RE | Admit: 2024-02-13 | Discharge: 2024-02-13 | Disposition: A | Discharge status: Home / Self Care | Source: Ambulatory Visit | Attending: Orthopaedic Surgery | Admitting: Orthopaedic Surgery

## 2024-02-13 DIAGNOSIS — S72141A Displaced intertrochanteric fracture of right femur, initial encounter for closed fracture: Secondary | ICD-10-CM | POA: Insufficient documentation

## 2024-02-13 DIAGNOSIS — M810 Age-related osteoporosis without current pathological fracture: Secondary | ICD-10-CM | POA: Insufficient documentation

## 2024-02-28 ENCOUNTER — Encounter: Admitting: Physician Assistant

## 2024-03-03 ENCOUNTER — Encounter: Payer: Self-pay | Admitting: Radiology

## 2024-03-14 ENCOUNTER — Ambulatory Visit: Admitting: Physician Assistant

## 2024-06-09 ENCOUNTER — Ambulatory Visit (HOSPITAL_BASED_OUTPATIENT_CLINIC_OR_DEPARTMENT_OTHER): Admitting: Family Medicine

## 2024-06-23 ENCOUNTER — Ambulatory Visit (HOSPITAL_BASED_OUTPATIENT_CLINIC_OR_DEPARTMENT_OTHER): Admitting: Family Medicine
